# Patient Record
Sex: Male | Born: 1935 | Race: White | Hispanic: No | Smoking: Former smoker
Health system: Southern US, Community
[De-identification: ages and names within clinical notes are randomized; demographics above are authoritative.]

## PROBLEM LIST (undated history)

## (undated) DIAGNOSIS — R001 Bradycardia, unspecified: Secondary | ICD-10-CM

## (undated) DIAGNOSIS — N400 Enlarged prostate without lower urinary tract symptoms: Secondary | ICD-10-CM

## (undated) DIAGNOSIS — Z960 Presence of urogenital implants: Secondary | ICD-10-CM

## (undated) DIAGNOSIS — I209 Angina pectoris, unspecified: Secondary | ICD-10-CM

## (undated) DIAGNOSIS — R2689 Other abnormalities of gait and mobility: Secondary | ICD-10-CM

## (undated) DIAGNOSIS — N189 Chronic kidney disease, unspecified: Secondary | ICD-10-CM

## (undated) DIAGNOSIS — R609 Edema, unspecified: Secondary | ICD-10-CM

## (undated) DIAGNOSIS — K219 Gastro-esophageal reflux disease without esophagitis: Secondary | ICD-10-CM

## (undated) DIAGNOSIS — M1990 Unspecified osteoarthritis, unspecified site: Secondary | ICD-10-CM

## (undated) DIAGNOSIS — I749 Embolism and thrombosis of unspecified artery: Secondary | ICD-10-CM

## (undated) DIAGNOSIS — Z77090 Contact with and (suspected) exposure to asbestos: Secondary | ICD-10-CM

## (undated) DIAGNOSIS — I219 Acute myocardial infarction, unspecified: Secondary | ICD-10-CM

## (undated) DIAGNOSIS — Z978 Presence of other specified devices: Secondary | ICD-10-CM

## (undated) DIAGNOSIS — H9193 Unspecified hearing loss, bilateral: Secondary | ICD-10-CM

## (undated) HISTORY — PX: CATARACT EXTRACTION, BILATERAL: SHX1313

## (undated) HISTORY — PX: BLEPHAROPLASTY: SUR158

## (undated) HISTORY — PX: CHOLECYSTECTOMY: SHX55

## (undated) HISTORY — PX: OTHER SURGICAL HISTORY: SHX169

---

## 2000-04-03 ENCOUNTER — Encounter: Payer: Self-pay | Admitting: *Deleted

## 2000-04-03 ENCOUNTER — Encounter: Admission: RE | Admit: 2000-04-03 | Discharge: 2000-04-03 | Payer: Self-pay | Admitting: *Deleted

## 2001-11-24 ENCOUNTER — Encounter: Payer: Self-pay | Admitting: Family Medicine

## 2001-11-24 ENCOUNTER — Encounter: Admission: RE | Admit: 2001-11-24 | Discharge: 2001-11-24 | Payer: Self-pay | Admitting: Family Medicine

## 2003-05-12 ENCOUNTER — Encounter: Payer: Self-pay | Admitting: *Deleted

## 2003-05-12 ENCOUNTER — Encounter: Admission: RE | Admit: 2003-05-12 | Discharge: 2003-05-12 | Payer: Self-pay | Admitting: *Deleted

## 2003-05-18 ENCOUNTER — Encounter: Admission: RE | Admit: 2003-05-18 | Discharge: 2003-05-18 | Payer: Self-pay | Admitting: Family Medicine

## 2003-05-18 ENCOUNTER — Encounter: Payer: Self-pay | Admitting: Family Medicine

## 2003-08-19 ENCOUNTER — Encounter: Payer: Self-pay | Admitting: Family Medicine

## 2003-08-19 ENCOUNTER — Encounter: Admission: RE | Admit: 2003-08-19 | Discharge: 2003-08-19 | Payer: Self-pay | Admitting: Family Medicine

## 2004-11-14 ENCOUNTER — Encounter: Admission: RE | Admit: 2004-11-14 | Discharge: 2004-11-14 | Payer: Self-pay | Admitting: *Deleted

## 2004-12-06 ENCOUNTER — Ambulatory Visit: Payer: Self-pay | Admitting: Gastroenterology

## 2004-12-10 ENCOUNTER — Ambulatory Visit: Payer: Self-pay | Admitting: Gastroenterology

## 2005-01-10 ENCOUNTER — Ambulatory Visit: Payer: Self-pay | Admitting: Gastroenterology

## 2005-03-14 ENCOUNTER — Ambulatory Visit (HOSPITAL_COMMUNITY): Admission: RE | Admit: 2005-03-14 | Discharge: 2005-03-14 | Payer: Self-pay | Admitting: Family Medicine

## 2006-11-24 ENCOUNTER — Encounter: Admission: RE | Admit: 2006-11-24 | Discharge: 2006-11-24 | Payer: Self-pay | Admitting: Family Medicine

## 2007-01-05 ENCOUNTER — Encounter: Admission: RE | Admit: 2007-01-05 | Discharge: 2007-01-05 | Payer: Self-pay | Admitting: Family Medicine

## 2007-11-18 ENCOUNTER — Ambulatory Visit (HOSPITAL_COMMUNITY): Admission: RE | Admit: 2007-11-18 | Discharge: 2007-11-18 | Payer: Self-pay | Admitting: Family Medicine

## 2007-11-18 ENCOUNTER — Ambulatory Visit: Payer: Self-pay | Admitting: Vascular Surgery

## 2007-11-18 ENCOUNTER — Encounter (INDEPENDENT_AMBULATORY_CARE_PROVIDER_SITE_OTHER): Payer: Self-pay | Admitting: Family Medicine

## 2008-02-09 ENCOUNTER — Encounter: Admission: RE | Admit: 2008-02-09 | Discharge: 2008-02-09 | Payer: Self-pay | Admitting: Family Medicine

## 2008-02-25 ENCOUNTER — Encounter: Admission: RE | Admit: 2008-02-25 | Discharge: 2008-02-25 | Payer: Self-pay | Admitting: Family Medicine

## 2008-06-17 ENCOUNTER — Encounter: Admission: RE | Admit: 2008-06-17 | Discharge: 2008-06-17 | Payer: Self-pay | Admitting: Neurology

## 2010-11-17 DIAGNOSIS — I219 Acute myocardial infarction, unspecified: Secondary | ICD-10-CM

## 2010-11-17 HISTORY — DX: Acute myocardial infarction, unspecified: I21.9

## 2010-11-18 ENCOUNTER — Encounter: Payer: Self-pay | Admitting: Family Medicine

## 2011-11-18 ENCOUNTER — Other Ambulatory Visit: Payer: Self-pay

## 2011-11-18 ENCOUNTER — Emergency Department (HOSPITAL_COMMUNITY): Payer: Medicare Other

## 2011-11-18 ENCOUNTER — Inpatient Hospital Stay (HOSPITAL_COMMUNITY)
Admission: EM | Admit: 2011-11-18 | Discharge: 2011-12-03 | DRG: 234 | Disposition: A | Discharge status: Skilled Nursing Facility | Payer: Medicare Other | Source: Ambulatory Visit | Attending: Surgery | Admitting: Surgery

## 2011-11-18 ENCOUNTER — Encounter (HOSPITAL_COMMUNITY): Payer: Self-pay

## 2011-11-18 DIAGNOSIS — R05 Cough: Secondary | ICD-10-CM | POA: Diagnosis not present

## 2011-11-18 DIAGNOSIS — Z87891 Personal history of nicotine dependence: Secondary | ICD-10-CM | POA: Diagnosis not present

## 2011-11-18 DIAGNOSIS — E871 Hypo-osmolality and hyponatremia: Secondary | ICD-10-CM | POA: Diagnosis not present

## 2011-11-18 DIAGNOSIS — I214 Non-ST elevation (NSTEMI) myocardial infarction: Secondary | ICD-10-CM | POA: Diagnosis not present

## 2011-11-18 DIAGNOSIS — R609 Edema, unspecified: Secondary | ICD-10-CM | POA: Diagnosis present

## 2011-11-18 DIAGNOSIS — K219 Gastro-esophageal reflux disease without esophagitis: Secondary | ICD-10-CM | POA: Diagnosis present

## 2011-11-18 DIAGNOSIS — J8410 Pulmonary fibrosis, unspecified: Secondary | ICD-10-CM | POA: Diagnosis not present

## 2011-11-18 DIAGNOSIS — I2510 Atherosclerotic heart disease of native coronary artery without angina pectoris: Secondary | ICD-10-CM

## 2011-11-18 DIAGNOSIS — Z77090 Contact with and (suspected) exposure to asbestos: Secondary | ICD-10-CM | POA: Diagnosis not present

## 2011-11-18 DIAGNOSIS — R0602 Shortness of breath: Secondary | ICD-10-CM | POA: Diagnosis not present

## 2011-11-18 DIAGNOSIS — I509 Heart failure, unspecified: Secondary | ICD-10-CM | POA: Diagnosis not present

## 2011-11-18 DIAGNOSIS — R339 Retention of urine, unspecified: Secondary | ICD-10-CM | POA: Diagnosis not present

## 2011-11-18 DIAGNOSIS — IMO0002 Reserved for concepts with insufficient information to code with codable children: Secondary | ICD-10-CM | POA: Diagnosis not present

## 2011-11-18 DIAGNOSIS — Z5189 Encounter for other specified aftercare: Secondary | ICD-10-CM | POA: Diagnosis not present

## 2011-11-18 DIAGNOSIS — N9989 Other postprocedural complications and disorders of genitourinary system: Secondary | ICD-10-CM | POA: Diagnosis not present

## 2011-11-18 DIAGNOSIS — I252 Old myocardial infarction: Secondary | ICD-10-CM | POA: Diagnosis not present

## 2011-11-18 DIAGNOSIS — D62 Acute posthemorrhagic anemia: Secondary | ICD-10-CM | POA: Diagnosis not present

## 2011-11-18 DIAGNOSIS — D72829 Elevated white blood cell count, unspecified: Secondary | ICD-10-CM | POA: Diagnosis present

## 2011-11-18 DIAGNOSIS — D696 Thrombocytopenia, unspecified: Secondary | ICD-10-CM | POA: Diagnosis not present

## 2011-11-18 DIAGNOSIS — Z86718 Personal history of other venous thrombosis and embolism: Secondary | ICD-10-CM | POA: Diagnosis not present

## 2011-11-18 DIAGNOSIS — I10 Essential (primary) hypertension: Secondary | ICD-10-CM | POA: Diagnosis present

## 2011-11-18 DIAGNOSIS — R059 Cough, unspecified: Secondary | ICD-10-CM | POA: Diagnosis not present

## 2011-11-18 DIAGNOSIS — I495 Sick sinus syndrome: Secondary | ICD-10-CM | POA: Diagnosis not present

## 2011-11-18 DIAGNOSIS — J90 Pleural effusion, not elsewhere classified: Secondary | ICD-10-CM | POA: Diagnosis not present

## 2011-11-18 DIAGNOSIS — Z7982 Long term (current) use of aspirin: Secondary | ICD-10-CM | POA: Diagnosis not present

## 2011-11-18 DIAGNOSIS — N401 Enlarged prostate with lower urinary tract symptoms: Secondary | ICD-10-CM | POA: Diagnosis not present

## 2011-11-18 DIAGNOSIS — R079 Chest pain, unspecified: Secondary | ICD-10-CM | POA: Diagnosis not present

## 2011-11-18 DIAGNOSIS — J9819 Other pulmonary collapse: Secondary | ICD-10-CM | POA: Diagnosis not present

## 2011-11-18 DIAGNOSIS — M1990 Unspecified osteoarthritis, unspecified site: Secondary | ICD-10-CM | POA: Diagnosis present

## 2011-11-18 DIAGNOSIS — Z01810 Encounter for preprocedural cardiovascular examination: Secondary | ICD-10-CM | POA: Diagnosis not present

## 2011-11-18 DIAGNOSIS — I219 Acute myocardial infarction, unspecified: Secondary | ICD-10-CM | POA: Diagnosis not present

## 2011-11-18 DIAGNOSIS — E119 Type 2 diabetes mellitus without complications: Secondary | ICD-10-CM | POA: Diagnosis present

## 2011-11-18 DIAGNOSIS — R0789 Other chest pain: Secondary | ICD-10-CM | POA: Diagnosis not present

## 2011-11-18 DIAGNOSIS — J438 Other emphysema: Secondary | ICD-10-CM | POA: Diagnosis not present

## 2011-11-18 DIAGNOSIS — D649 Anemia, unspecified: Secondary | ICD-10-CM | POA: Diagnosis not present

## 2011-11-18 DIAGNOSIS — I2109 ST elevation (STEMI) myocardial infarction involving other coronary artery of anterior wall: Secondary | ICD-10-CM | POA: Diagnosis not present

## 2011-11-18 DIAGNOSIS — Z09 Encounter for follow-up examination after completed treatment for conditions other than malignant neoplasm: Secondary | ICD-10-CM | POA: Diagnosis not present

## 2011-11-18 DIAGNOSIS — I498 Other specified cardiac arrhythmias: Secondary | ICD-10-CM | POA: Diagnosis not present

## 2011-11-18 DIAGNOSIS — J811 Chronic pulmonary edema: Secondary | ICD-10-CM | POA: Diagnosis not present

## 2011-11-18 DIAGNOSIS — N400 Enlarged prostate without lower urinary tract symptoms: Secondary | ICD-10-CM | POA: Diagnosis present

## 2011-11-18 DIAGNOSIS — E8779 Other fluid overload: Secondary | ICD-10-CM | POA: Diagnosis not present

## 2011-11-18 DIAGNOSIS — N390 Urinary tract infection, site not specified: Secondary | ICD-10-CM | POA: Diagnosis not present

## 2011-11-18 DIAGNOSIS — Z951 Presence of aortocoronary bypass graft: Secondary | ICD-10-CM | POA: Diagnosis not present

## 2011-11-18 DIAGNOSIS — R091 Pleurisy: Secondary | ICD-10-CM | POA: Diagnosis not present

## 2011-11-18 HISTORY — DX: Embolism and thrombosis of unspecified artery: I74.9

## 2011-11-18 HISTORY — DX: Benign prostatic hyperplasia without lower urinary tract symptoms: N40.0

## 2011-11-18 HISTORY — DX: Contact with and (suspected) exposure to asbestos: Z77.090

## 2011-11-18 HISTORY — DX: Gastro-esophageal reflux disease without esophagitis: K21.9

## 2011-11-18 HISTORY — DX: Acute myocardial infarction, unspecified: I21.9

## 2011-11-18 HISTORY — DX: Bradycardia, unspecified: R00.1

## 2011-11-18 HISTORY — DX: Angina pectoris, unspecified: I20.9

## 2011-11-18 HISTORY — DX: Edema, unspecified: R60.9

## 2011-11-18 HISTORY — DX: Unspecified osteoarthritis, unspecified site: M19.90

## 2011-11-18 LAB — CBC
Hemoglobin: 13.8 g/dL (ref 13.0–17.0)
MCHC: 34.4 g/dL (ref 30.0–36.0)
MCV: 91 fL (ref 78.0–100.0)
Platelets: 208 10*3/uL (ref 150–400)
RBC: 4.36 MIL/uL (ref 4.22–5.81)
RBC: 4.33 MIL/uL (ref 4.22–5.81)
RDW: 13.8 % (ref 11.5–15.5)
WBC: 8.4 10*3/uL (ref 4.0–10.5)
WBC: 11 10*3/uL — ABNORMAL HIGH (ref 4.0–10.5)

## 2011-11-18 LAB — CREATININE, SERUM
GFR calc Af Amer: 83 mL/min — ABNORMAL LOW (ref >90)
GFR calc non Af Amer: 72 mL/min — ABNORMAL LOW (ref >90)

## 2011-11-18 LAB — COMPREHENSIVE METABOLIC PANEL
AST: 18 U/L (ref 0–37)
Albumin: 3.6 g/dL (ref 3.5–5.2)
Alkaline Phosphatase: 65 U/L (ref 39–117)
BUN: 22 mg/dL (ref 6–23)
Chloride: 104 mEq/L (ref 96–112)
Potassium: 3.7 mEq/L (ref 3.5–5.1)
Total Bilirubin: 0.3 mg/dL (ref 0.3–1.2)

## 2011-11-18 LAB — DIFFERENTIAL
Basophils Relative: 0 % (ref 0–1)
Monocytes Relative: 7 % (ref 3–12)
Neutro Abs: 6.2 10*3/uL (ref 1.7–7.7)
Neutrophils Relative %: 74 % (ref 43–77)

## 2011-11-18 LAB — CARDIAC PANEL(CRET KIN+CKTOT+MB+TROPI)
Relative Index: 11.1 — ABNORMAL HIGH (ref 0.0–2.5)
Total CK: 119 U/L (ref 7–232)
Total CK: 507 U/L — ABNORMAL HIGH (ref 7–232)
Troponin I: 6.2 ng/mL (ref <0.30)

## 2011-11-18 LAB — HEMOGLOBIN A1C: Mean Plasma Glucose: 131 mg/dL — ABNORMAL HIGH (ref <117)

## 2011-11-18 LAB — URINALYSIS, ROUTINE W REFLEX MICROSCOPIC
Glucose, UA: 250 mg/dL — AB
Hgb urine dipstick: NEGATIVE
Specific Gravity, Urine: 1.014 (ref 1.005–1.030)
pH: 6 (ref 5.0–8.0)

## 2011-11-18 LAB — HEPARIN LEVEL (UNFRACTIONATED): Heparin Unfractionated: 0.33 IU/mL (ref 0.30–0.70)

## 2011-11-18 LAB — APTT: aPTT: 69 seconds — ABNORMAL HIGH (ref 24–37)

## 2011-11-18 MED ORDER — HEPARIN BOLUS VIA INFUSION
4000.0000 [IU] | Freq: Once | INTRAVENOUS | Status: AC
  Administered 2011-11-18: 4000 [IU] via INTRAVENOUS
  Filled 2011-11-18: qty 4000

## 2011-11-18 MED ORDER — ONDANSETRON HCL 4 MG/2ML IJ SOLN: 4.0000 mg | Freq: Four times a day (QID) | INTRAMUSCULAR | Status: DC | PRN

## 2011-11-18 MED ORDER — SODIUM CHLORIDE 0.9 % IV SOLN
Freq: Once | INTRAVENOUS | Status: AC
  Administered 2011-11-18: 20 mL/h via INTRAVENOUS

## 2011-11-18 MED ORDER — ACETAMINOPHEN 325 MG PO TABS
650.0000 mg | ORAL_TABLET | ORAL | Status: DC | PRN
  Administered 2011-11-20 (×2): 650 mg via ORAL
  Filled 2011-11-18 (×2): qty 2

## 2011-11-18 MED ORDER — ADULT MULTIVITAMIN W/MINERALS CH
1.0000 | ORAL_TABLET | Freq: Every day | ORAL | Status: DC
  Administered 2011-11-19 – 2011-11-25 (×7): 1 via ORAL
  Filled 2011-11-18 (×10): qty 1

## 2011-11-18 MED ORDER — NITROGLYCERIN IN D5W 200-5 MCG/ML-% IV SOLN
5.0000 ug/min | INTRAVENOUS | Status: AC
  Administered 2011-11-18: 5 ug/min via INTRAVENOUS
  Filled 2011-11-18: qty 250

## 2011-11-18 MED ORDER — HEPARIN SOD (PORCINE) IN D5W 100 UNIT/ML IV SOLN
1200.0000 [IU]/h | INTRAVENOUS | Status: DC
  Administered 2011-11-18: 1100 [IU]/h via INTRAVENOUS
  Administered 2011-11-19: 1200 [IU]/h via INTRAVENOUS
  Filled 2011-11-18 (×4): qty 250

## 2011-11-18 MED ORDER — NITROGLYCERIN 2 % TD OINT: 1.0000 [in_us] | TOPICAL_OINTMENT | Freq: Once | TRANSDERMAL | Status: DC

## 2011-11-18 MED ORDER — ASPIRIN EC 81 MG PO TBEC
81.0000 mg | DELAYED_RELEASE_TABLET | Freq: Every day | ORAL | Status: DC
  Administered 2011-11-19 – 2011-11-20 (×2): 81 mg via ORAL
  Filled 2011-11-18 (×3): qty 1

## 2011-11-18 MED ORDER — NITROGLYCERIN 0.4 MG SL SUBL
0.4000 mg | SUBLINGUAL_TABLET | SUBLINGUAL | Status: DC | PRN
  Filled 2011-11-18: qty 25

## 2011-11-18 MED ORDER — ALPRAZOLAM 0.25 MG PO TABS: 0.2500 mg | ORAL_TABLET | Freq: Two times a day (BID) | ORAL | Status: DC | PRN

## 2011-11-18 MED ORDER — METOPROLOL TARTRATE 12.5 MG HALF TABLET
12.5000 mg | ORAL_TABLET | Freq: Two times a day (BID) | ORAL | Status: DC
  Administered 2011-11-18 – 2011-11-24 (×8): 12.5 mg via ORAL
  Filled 2011-11-18 (×14): qty 1

## 2011-11-18 MED ORDER — HEPARIN BOLUS VIA INFUSION: 4000.0000 [IU] | Freq: Once | INTRAVENOUS | Status: DC

## 2011-11-18 MED ORDER — ASPIRIN 81 MG PO CHEW
324.0000 mg | CHEWABLE_TABLET | ORAL | Status: AC
  Administered 2011-11-18: 243 mg via ORAL
  Filled 2011-11-18: qty 4

## 2011-11-18 MED ORDER — TAMSULOSIN HCL 0.4 MG PO CAPS
0.4000 mg | ORAL_CAPSULE | Freq: Every day | ORAL | Status: DC
  Administered 2011-11-19 – 2011-12-03 (×14): 0.4 mg via ORAL
  Filled 2011-11-18 (×17): qty 1

## 2011-11-18 MED ORDER — PANTOPRAZOLE SODIUM 40 MG PO TBEC
40.0000 mg | DELAYED_RELEASE_TABLET | Freq: Every day | ORAL | Status: DC
  Administered 2011-11-18 – 2011-11-27 (×9): 40 mg via ORAL
  Filled 2011-11-18 (×8): qty 1

## 2011-11-18 NOTE — Progress Notes (Signed)
ANTICOAGULATION CONSULT NOTE - Initial Consult  Pharmacy Consult for heparin Indication: NSTEMI  No Known Allergies  Patient Measurements: Heparin Dosing Weight: 84.1kg  Vital Signs: Temp: 97.1 F (36.2 C) (01/21 1158) Temp src: Oral (01/21 1158) BP: 159/67 mmHg (01/21 1216) Pulse Rate: 66  (01/21 1216)  Labs:  Basename 11/18/11 1117  HGB 13.8  HCT 40.1  PLT 201  APTT --  LABPROT --  INR --  HEPARINUNFRC --  CREATININE --  CKTOTAL --  CKMB --  TROPONINI --   CrCl is unknown because no creatinine reading has been taken and the patient has no height on file.  Medical History: Past Medical History  Diagnosis Date  . Edema   . Acid reflux   . Benign prostatic hypertrophy     Medications:  Aspirin, nexium, lasix, MVI, flomax  Assessment: 89 yom admitted with chest pain without previous cardiac history. Starting empiric IV heparin. Stated height/weight of 68in/185lb. Baseline CBC WNL.   Goal of Therapy:  Heparin level 0.3-0.7 units/ml   Plan:  1. Heparin bolus 4000units IV x 1 2. Heparin gtt 1100units/hr 3. Check 8 hour heparin level 4. Daily heparin level and CBC  Waddell Iten, Drake Leach 11/18/2011,12:23 PM

## 2011-11-18 NOTE — ED Notes (Signed)
Cardiac marker results shown to Dr. Ignacia Palma

## 2011-11-18 NOTE — ED Notes (Signed)
[id number] Ready 

## 2011-11-18 NOTE — ED Provider Notes (Signed)
History     CSN: [id number]  Arrival date & time 11/18/11  1050   First MD Initiated Contact with Patient 11/18/11 1105      Chief Complaint  Patient presents with  . Chest Pain    (Consider location/radiation/quality/duration/timing/severity/associated sxs/prior treatment) HPI Comments: The patient is an 78 year old man who says that he'd had a bad cough for 3 weeks. The cold finally got better. He had felt a pain in the upper chest, right Center, for the past few days. He says "it just hurts, it is uncomfortable. He can't rest because of the pain. He has taken aspirin at night for the past couple of nights, trying to enable himself to  rest. He therefore sought evaluation.  There is no prior history of coronary artery disease.  Patient is a 78 y.o. male presenting with chest pain. The history is provided by the patient.  Chest Pain The chest pain began 3 - 5 days ago. Episode Length: He feels the pain fairly constantly, in the upper chest. The chest pain is unchanged. Associated with: There was no precipitating event, other than the fact that he had a recent cold. At its most intense, the pain is at 3/10. The pain is currently at 3/10. The severity of the pain is mild. Quality: " It just hurts." The pain does not radiate. Exacerbated by: Nothing. Pertinent negatives for primary symptoms include no fever, no shortness of breath, no nausea, no vomiting and no dizziness. Cough: he had a cough with his cold, but that has resolved. He tried aspirin for the symptoms. Risk factors include male gender, lack of exercise and being elderly. Past medical history comments: He takes Lasix for peripheral edema. He is on Nexium for GERD, and Flomax for BPH.     Past Medical History  Diagnosis Date  . Edema   . Acid reflux     History reviewed. No pertinent past surgical history.  History reviewed. No pertinent family history.  History  Substance Use Topics  . Smoking status: Never Smoker   .  Smokeless tobacco: Not on file  . Alcohol Use: No      Review of Systems  Constitutional: Negative.  Negative for fever and chills.  HENT: Negative.   Eyes: Negative.   Respiratory: Negative for shortness of breath. Cough: he had a cough with his cold, but that has resolved.        He had a recent upper respiratory infection that lasted 3 weeks.  Cardiovascular: Positive for chest pain.  Gastrointestinal: Negative.  Negative for nausea and vomiting.  Genitourinary: Negative.   Musculoskeletal: Negative.   Skin: Negative.   Neurological: Negative.  Negative for dizziness.  Psychiatric/Behavioral: Negative.     Allergies  Review of patient's allergies indicates no known allergies.  Home Medications   Current Outpatient Rx  Name Route Sig Dispense Refill  . ASPIRIN EC 81 MG PO TBEC Oral Take 81 mg by mouth daily.    Marland Kitchen ESOMEPRAZOLE MAGNESIUM 40 MG PO CPDR Oral Take 40 mg by mouth daily before breakfast.    . FUROSEMIDE 40 MG PO TABS Oral Take 40 mg by mouth daily.    . ADULT MULTIVITAMIN W/MINERALS CH Oral Take 1 tablet by mouth daily.    Marland Kitchen TAMSULOSIN HCL 0.4 MG PO CAPS Oral Take 0.4 mg by mouth daily after breakfast.      BP 159/88  Pulse 77  Temp 97 F (36.1 C)  Resp 18  SpO2 98%  Physical Exam  Nursing note and vitals reviewed. Constitutional: He is oriented to person, place, and time. He appears well-developed and well-nourished. No distress.  HENT:  Head: Normocephalic and atraumatic.  Right Ear: External ear normal.  Left Ear: External ear normal.  Mouth/Throat: Oropharyngeal exudate: he has poor dentition.  Eyes: Conjunctivae and EOM are normal. Pupils are equal, round, and reactive to light.  Neck: Normal range of motion. Neck supple.  Cardiovascular: Normal rate, regular rhythm and normal heart sounds.   Pulmonary/Chest: Effort normal and breath sounds normal.  Abdominal: Soft. Bowel sounds are normal.  Musculoskeletal: Normal range of motion.       Has 2+  pitting edema in both legs. This extended almost up to his knees.  Neurological: He is alert and oriented to person, place, and time.       No sensory or motor deficit.  Skin: Skin is warm and dry.  Psychiatric: He has a normal mood and affect. His behavior is normal.    ED Course  CRITICAL CARE Performed by: Osvaldo Human Authorized by: Osvaldo Human Critical care was necessary to treat or prevent imminent or life-threatening deterioration of the following conditions: Treatment of NSTEMI.  Critical care was time spent personally by me on the following activities: development of treatment plan with patient or surrogate, discussions with consultants, evaluation of patient's response to treatment, examination of patient, obtaining history from patient or surrogate, ordering and performing treatments and interventions, ordering and review of laboratory studies, ordering and review of radiographic studies, re-evaluation of patient's condition and review of old charts.   (including critical care time)   Labs Reviewed  CBC  DIFFERENTIAL  COMPREHENSIVE METABOLIC PANEL  I-STAT TROPONIN I   11:43 AM  Date: 11/18/2011  Rate: 87  Rhythm: normal sinus rhythm and premature ventricular contractions (PVC)  QRS Axis: normal  Intervals: PR prolonged  ST/T Wave abnormalities: ST depressions inferiorly and ST depressions laterally  Conduction Disutrbances:none  Narrative Interpretation: Abnormal EKG  Old EKG Reviewed: none available   11:57 AM Troponin I was 0.17, high.  Will treat as a NSTEMI with heparin (dosing per Pharmacy) and  IV nitroglycerin.  Call to Dr. Eldridge Dace (in cath lab currently) and he will admit.      1. NSTEMI (non-ST elevated myocardial infarction)           Carleene Cooper III, MD 11/18/11 [phone]

## 2011-11-18 NOTE — ED Notes (Signed)
Pt complains of chest pain, started 2-3 days ago, sts feels like coming down with cold and has had a lot of coughing.

## 2011-11-18 NOTE — ED Provider Notes (Signed)
Pt complains of chest pain off and on for a few days.  pe lungs clear heart rrr.  ekg with st depression.  Pt will be sent to pod a  Benny Lennert, MD 11/18/11 1124

## 2011-11-18 NOTE — ED Notes (Signed)
Family at bedside. 

## 2011-11-18 NOTE — ED Notes (Signed)
First contact with pt. Pt c/o cp for 1 week. Pt states no change in pain just has not gotten better. Pt with monitors intact daughter at bedside

## 2011-11-18 NOTE — Progress Notes (Signed)
ANTICOAGULATION CONSULT NOTE - Follow Up Consult  Pharmacy Consult for heparin Indication: NSTEMI  No Known Allergies  Patient Measurements: Height: 5\' 8"  (172.7 cm) Weight: 173 lb 11.6 oz (78.8 kg) IBW/kg (Calculated) : 68.4   Vital Signs: Temp: 97.5 F (36.4 C) (01/21 2048) Temp src: Oral (01/21 2048) BP: 133/73 mmHg (01/21 2048) Pulse Rate: 56  (01/21 2048)  Labs:  Basename 11/18/11 2144 11/18/11 1806 11/18/11 1349 11/18/11 1117  HGB -- 13.5 -- 13.8  HCT -- 39.4 -- 40.1  PLT -- 208 -- 201  APTT -- 69* -- --  LABPROT -- -- 13.3 --  INR -- -- 0.99 --  HEPARINUNFRC 0.33 -- -- --  CREATININE -- 0.95 -- 1.02  CKTOTAL -- 507* 119 --  CKMB -- 56.3* 6.8* --  TROPONINI -- 6.20* 0.43* --   Estimated Creatinine Clearance: 51 ml/min (by C-G formula based on Cr of 0.95).   Medications:  Scheduled:    . sodium chloride   Intravenous Once  . aspirin  324 mg Oral NOW  . aspirin EC  81 mg Oral Daily  . heparin  4,000 Units Intravenous Once  . metoprolol tartrate  12.5 mg Oral BID  . mulitivitamin with minerals  1 tablet Oral Daily  . pantoprazole  40 mg Oral Daily  . Tamsulosin HCl  0.4 mg Oral QPC breakfast  . DISCONTD: heparin  4,000 Units Intravenous Once  . DISCONTD: nitroGLYCERIN  1 inch Topical Once   Infusions:    . heparin 1,100 Units/hr (11/18/11 2200)  . nitroGLYCERIN 10 mcg/min (11/18/11 2200)    Assessment: 78yo male therapeutic on heparin with initial dosing for NSTEMI.  Goal of Therapy:  Heparin level 0.3-0.7 units/ml   Plan:  Will continue heparin at current rate and confirm stable with am labs.  Colleen Can PharmD BCPS 11/18/2011,11:33 PM

## 2011-11-18 NOTE — H&P (Signed)
Admit date: 11/18/2011 Referring Physician Dr. Ignacia Palma Primary Cardiologist Eldridge Dace Chief complaint/reason for admission: Chest pain  HPI: 78 year old man with no prior cardiac history.  He had a cold about a week ago.  He was coughing a lot and developed some chest pain with coughing.  He was in the upper part of his chest.  The cold resolved but the chest pain persisted.  It was somewhat better when he would rest and worse when he would walk.  He came to the emergency room be checked out after calling his primary care physician.  His initial cardiac enzyme was abnormal at 0.17 for the troponin.  His ECG was not diagnostic for an MI.  He continues to have some on-and-off chest discomfort in the upper portion of his chest.  It is not severe.  He is referred for further evaluation.  He does not report any diaphoresis, nausea, vomiting, focal weakness.  He does have chronic edema, particularly in his left leg.  He did have a DVT in that leg several years ago for which he took Coumadin for some time.  He is not on Coumadin.  He would like to avoid any invasive procedures if possible and would like to be medically managed for whatever his current issue is.  He will be turning 89 on Saturday.    PMH:    Past Medical History  Diagnosis Date  . Edema   . Acid reflux   . Benign prostatic hypertrophy     PSH:   Cholecystectomy  ALLERGIES:   Review of patient's allergies indicates no known allergies.  Prior to Admit Meds:   (Not in a hospital admission) Family HX:   Father had heart disease  Social HX:    History   Social History  . Marital Status: Widowed    Spouse Name: N/A    Number of Children: N/A  . Years of Education: N/A   Occupational History  . Not on file.   Social History Main Topics  . Smoking status: Never Smoker   . Smokeless tobacco: Not on file  . Alcohol Use: No  . Drug Use: No  . Sexually Active:    Other Topics Concern  . Not on file   Social History Narrative    . No narrative on file     ROS:  All 11 ROS were addressed and are negative except what is stated in the HPI  PHYSICAL EXAM Filed Vitals:   11/18/11 1216  BP: 159/67  Pulse: 66  Temp:   Resp: 20   General: Well developed, well nourished, in no acute distress Head:    Normal cephalic and atramatic  Lungs:   Clear bilaterally to auscultation and percussion. Heart:   HRRR S1 S2 Abdomen:  abdomen soft and non-tender  Msk:  Mild kyphosis Extremities:  1+ edema in the left leg, trace edema in the right leg. Neuro: Alert and oriented . Psych:  Good affect, responds appropriately   Labs:   Lab Results  Component Value Date   WBC 8.4 11/18/2011   HGB 13.8 11/18/2011   HCT 40.1 11/18/2011   MCV 92.0 11/18/2011   PLT 201 11/18/2011    Lab 11/18/11 1117  NA 142  K 3.7  CL 104  CO2 27  BUN 22  CREATININE 1.02  CALCIUM 9.5  PROT 7.1  BILITOT 0.3  ALKPHOS 65  ALT 17  AST 18  GLUCOSE 191*   No results found for this basename: CKTOTAL, CKMB, CKMBINDEX,  TROPONINI   No results found for this basename: PTT   No results found for this basename: INR, PROTIME     No results found for this basename: CHOL   No results found for this basename: HDL   No results found for this basename: LDLCALC   No results found for this basename: TRIG   No results found for this basename: CHOLHDL   No results found for this basename: LDLDIRECT      Radiology:  @[id number] @  EKG:  Normal sinus rhythm, PVCs, mild ST depression in the lateral leads.  No ST elevation.  ASSESSMENT: 78 year old with mildly abnormal cardiac enzyme and atypical chest pain.    PLAN:   unstable angina.  Will admit to step down unit.  Titrate nitroglycerin to see if this helps.  Agree with heparin drip.  We'll rule out for MI with enzymes.  Am not sure what the significance of the 0.17 troponin is on the point of care markers.  We'll also check echocardiogram today.  His LV function is normal, would try to manage  him medically.  If his LV function is abnormal or he is a focal wall motion abnormality, would have to consider angiography.  He does prefer to avoid any invasive procedures.  Angiography would be somewhat higher risk due to his advanced age.  Could try long-acting nitrates and beta blockade if this truly does turn out to be angina.  Chronic edema.  Likely related to prior DVT and venous insufficiency.  Will hold Lasix for now until his cardiac status is more defined.  Corky Crafts., MD  11/18/2011  1:53 PM

## 2011-11-19 LAB — BASIC METABOLIC PANEL
CO2: 28 mEq/L (ref 19–32)
Calcium: 9.5 mg/dL (ref 8.4–10.5)
Creatinine, Ser: 0.98 mg/dL (ref 0.50–1.35)
GFR calc Af Amer: 82 mL/min — ABNORMAL LOW (ref >90)
GFR calc non Af Amer: 71 mL/min — ABNORMAL LOW (ref >90)
Sodium: 142 mEq/L (ref 135–145)

## 2011-11-19 LAB — CARDIAC PANEL(CRET KIN+CKTOT+MB+TROPI)
Relative Index: 9.6 — ABNORMAL HIGH (ref 0.0–2.5)
Total CK: 646 U/L — ABNORMAL HIGH (ref 7–232)
Troponin I: 13.79 ng/mL (ref <0.30)

## 2011-11-19 LAB — URINALYSIS, ROUTINE W REFLEX MICROSCOPIC
Glucose, UA: NEGATIVE mg/dL
Specific Gravity, Urine: 1.012 (ref 1.005–1.030)
Urobilinogen, UA: 0.2 mg/dL (ref 0.0–1.0)

## 2011-11-19 LAB — CBC
MCH: 30.6 pg (ref 26.0–34.0)
MCHC: 33.6 g/dL (ref 30.0–36.0)
MCV: 91 fL (ref 78.0–100.0)
Platelets: 195 10*3/uL (ref 150–400)
RBC: 4.12 MIL/uL — ABNORMAL LOW (ref 4.22–5.81)
RDW: 13.7 % (ref 11.5–15.5)

## 2011-11-19 LAB — LIPID PANEL
Cholesterol: 136 mg/dL (ref 0–200)
Triglycerides: 68 mg/dL (ref <150)
VLDL: 14 mg/dL (ref 0–40)

## 2011-11-19 LAB — URINE MICROSCOPIC-ADD ON

## 2011-11-19 MED ORDER — ISOSORBIDE MONONITRATE ER 30 MG PO TB24
30.0000 mg | ORAL_TABLET | Freq: Every day | ORAL | Status: DC
  Administered 2011-11-19 – 2011-11-20 (×2): 30 mg via ORAL
  Filled 2011-11-19 (×2): qty 1

## 2011-11-19 NOTE — Progress Notes (Signed)
ANTICOAGULATION CONSULT NOTE - Follow Up Consult  Pharmacy Consult for Heparin Indication: ACS sx  Assessment: 78 y.o. M on warfarin for ACS sx while ruling out MI. Heparin level is slightly SUBtherapeutic this a.m (HL 0.29, goal of 0.3-0.7). No s/sx of bleeding noted at this time. Heparin drip is infusing at proper rate.   Goal of Therapy:  Heparin level 0.3-0.7 units/ml   Plan:  1. Increase heparin drip to 1200 units/hr (12 ml/hr) 2. Will continue to monitor for any signs/symptoms of bleeding and will follow up with heparin level in the a.m.   Georgina Pillion, PharmD, BCPS Clinical Pharmacist Pager: [phone] 11/19/2011 8:09 AM     No Known Allergies  Patient Measurements: Height: 5\' 8"  (172.7 cm) Weight: 173 lb 11.6 oz (78.8 kg) IBW/kg (Calculated) : 68.4  Heparin Dosing Weight: 78.8  Vital Signs: Temp: 98.4 F (36.9 C) (01/22 0410) Temp src: Oral (01/22 0410) BP: 124/64 mmHg (01/22 0410) Pulse Rate: 55  (01/22 0410)  Labs:  Basename 11/19/11 0505 11/18/11 2332 11/18/11 2144 11/18/11 1806 11/18/11 1349 11/18/11 1117  HGB 12.6* -- -- 13.5 -- --  HCT 37.5* -- -- 39.4 -- 40.1  PLT 195 -- -- 208 -- 201  APTT -- -- -- 69* -- --  LABPROT -- -- -- -- 13.3 --  INR -- -- -- -- 0.99 --  HEPARINUNFRC 0.29* -- 0.33 -- -- --  CREATININE 0.98 -- -- 0.95 -- 1.02  CKTOTAL -- 646* -- 507* 119 --  CKMB -- 62.1* -- 56.3* 6.8* --  TROPONINI -- 13.79* -- 6.20* 0.43* --   Estimated Creatinine Clearance: 49.4 ml/min (by C-G formula based on Cr of 0.98).   Medications:  Prescriptions prior to admission  Medication Sig Dispense Refill  . aspirin EC 81 MG tablet Take 81 mg by mouth daily.      Marland Kitchen esomeprazole (NEXIUM) 40 MG capsule Take 40 mg by mouth daily before breakfast.      . furosemide (LASIX) 40 MG tablet Take 40 mg by mouth daily.      . Multiple Vitamin (MULITIVITAMIN WITH MINERALS) TABS Take 1 tablet by mouth daily.      . Tamsulosin HCl (FLOMAX) 0.4 MG CAPS Take 0.4 mg  by mouth daily after breakfast.       Scheduled:    . sodium chloride   Intravenous Once  . aspirin  324 mg Oral NOW  . aspirin EC  81 mg Oral Daily  . heparin  4,000 Units Intravenous Once  . metoprolol tartrate  12.5 mg Oral BID  . mulitivitamin with minerals  1 tablet Oral Daily  . pantoprazole  40 mg Oral Daily  . Tamsulosin HCl  0.4 mg Oral QPC breakfast  . DISCONTD: heparin  4,000 Units Intravenous Once  . DISCONTD: nitroGLYCERIN  1 inch Topical Once

## 2011-11-19 NOTE — Progress Notes (Signed)
SUBJECTIVE:  Chest pain resolved last night.  No further chest pain this morning.  He slept well.  His appetite is normal.  OBJECTIVE:   Vitals:   Filed Vitals:   11/18/11 2048 11/18/11 2357 11/19/11 0410 11/19/11 0500  BP: 133/73 135/73 124/64   Pulse: 56 65 55   Temp: 97.5 F (36.4 C) 98.3 F (36.8 C) 98.4 F (36.9 C)   TempSrc: Oral Oral Oral   Resp: 17 14 14    Height:      Weight:   78.8 kg (173 lb 11.6 oz) 78.8 kg (173 lb 11.6 oz)  SpO2: 95% 98% 98%    I&O's:   Intake/Output Summary (Last 24 hours) at 11/19/11 0914 Last data filed at 11/19/11 0500  Gross per 24 hour  Intake      0 ml  Output    275 ml  Net   -275 ml   TELEMETRY: Reviewed telemetry pt in normal sinus rhythm.     PHYSICAL EXAM General: Well developed, well nourished, in no acute distress Head:    Normal cephalic and atramatic  Lungs:   Clear bilaterally to auscultation  Heart:   HRRR S1 S2  Abdomen: , abdomen soft and non-tender Msk:  Back normal,  Normal strength and tone for age. Extremities:   Trace edema.   Neuro: Alert and oriented X 3. Psych:  Good affect, responds appropriately   LABS: Basic Metabolic Panel:  Basename 11/19/11 0505 11/18/11 1806 11/18/11 1117  NA 142 -- 142  K 4.2 -- 3.7  CL 105 -- 104  CO2 28 -- 27  GLUCOSE 112* -- 191*  BUN 17 -- 22  CREATININE 0.98 0.95 --  CALCIUM 9.5 -- 9.5  MG -- -- --  PHOS -- -- --   Liver Function Tests:  Basename 11/18/11 1117  AST 18  ALT 17  ALKPHOS 65  BILITOT 0.3  PROT 7.1  ALBUMIN 3.6   No results found for this basename: LIPASE:2,AMYLASE:2 in the last 72 hours CBC:  Basename 11/19/11 0505 11/18/11 1806 11/18/11 1117  WBC 10.2 11.0* --  NEUTROABS -- -- 6.2  HGB 12.6* 13.5 --  HCT 37.5* 39.4 --  MCV 91.0 91.0 --  PLT 195 208 --   Cardiac Enzymes:  Basename 11/18/11 2332 11/18/11 1806 11/18/11 1349  CKTOTAL 646* 507* 119  CKMB 62.1* 56.3* 6.8*  CKMBINDEX -- -- --  TROPONINI 13.79* 6.20* 0.43*   BNP: No  components found with this basename: POCBNP:3 D-Dimer: No results found for this basename: DDIMER:2 in the last 72 hours Hemoglobin A1C:  Basename 11/18/11 1806  HGBA1C 6.2*   Fasting Lipid Panel:  Basename 11/19/11 0505  CHOL 136  HDL 53  LDLCALC 69  TRIG 68  CHOLHDL 2.6  LDLDIRECT --   Thyroid Function Tests: No results found for this basename: TSH,T4TOTAL,FREET3,T3FREE,THYROIDAB in the last 72 hours Anemia Panel: No results found for this basename: VITAMINB12,FOLATE,FERRITIN,TIBC,IRON,RETICCTPCT in the last 72 hours Coag Panel:   Lab Results  Component Value Date   INR 0.99 11/18/2011    RADIOLOGY: Dg Chest Portable 1 View  11/18/2011  *RADIOLOGY REPORT*  Clinical Data: Chest pain, cough, shortness of breath  PORTABLE CHEST - 1 VIEW  Comparison: 04/02/2011  Findings: Cardiomediastinal silhouette is stable.  Bilateral pleural calcifications are again noted.  Worsening pleural calcifications in the left upper hemithorax.  Stable bilateral basilar atelectasis or scarring.  No convincing pulmonary edema or focal infiltrate.  IMPRESSION: Worsening pleural calcifications in the left upper  hemithorax. Stable bilateral basilar atelectasis or scarring.  No convincing pulmonary edema or focal infiltrate.  Original Report Authenticated By: Natasha Mead, M.D.      ASSESSMENT: Non-ST elevation MI  PLAN:  I long discussion with the patient's family.  Given that he is asymptomatic now and his advanced age, we'll plan for continued medical therapy.  If his ECG changes or if he has more symptoms, could consider angiography.  We'll leave him on IV heparin for another day as well as IV nitroglycerin.  We'll try to convert him over to him to work.  Tomorrow, we'll try to get him out of bed walking more to see if this induces any angina.  If not, we'll continue medical therapy.  Continue beta blocker at current dose as his heart rate is in the 50s at rest.  We also discussed CODE STATUS.  For now he  is a full code.  He will talk things over more with his daughter.  He is quite independent.  He drives and cooks for himself.  He lives alone.  He does not walk much for exercise but overall, is quite healthy.  If he did have refractory angina, a cardiac cath would not be unreasonable.  Echocardiogram showed overall normal LV function which may make it easier to manage him medically.  Corky Crafts., MD  11/19/2011  9:14 AM

## 2011-11-19 NOTE — Progress Notes (Signed)
Patient voided in urinal then used some toilet paper to wipe himself dry.Patient noticed some blood on toilet paper.No visible blood seen in urine.Patient remains on heparin drip at 1200 units/hour.Patient and family concerned will place call to MD. me

## 2011-11-20 ENCOUNTER — Encounter (HOSPITAL_COMMUNITY)
Admission: EM | Disposition: A | Discharge status: Skilled Nursing Facility | Payer: Self-pay | Source: Ambulatory Visit | Attending: Interventional Cardiology

## 2011-11-20 ENCOUNTER — Other Ambulatory Visit: Payer: Self-pay

## 2011-11-20 HISTORY — PX: LEFT HEART CATHETERIZATION WITH CORONARY ANGIOGRAM: SHX5451

## 2011-11-20 LAB — BASIC METABOLIC PANEL
CO2: 25 mEq/L (ref 19–32)
Glucose, Bld: 100 mg/dL — ABNORMAL HIGH (ref 70–99)
Potassium: 3.9 mEq/L (ref 3.5–5.1)
Sodium: 141 mEq/L (ref 135–145)

## 2011-11-20 LAB — CBC
HCT: 34.4 % — ABNORMAL LOW (ref 39.0–52.0)
Hemoglobin: 11.6 g/dL — ABNORMAL LOW (ref 13.0–17.0)
MCH: 30.9 pg (ref 26.0–34.0)
MCHC: 33.7 g/dL (ref 30.0–36.0)

## 2011-11-20 SURGERY — LEFT HEART CATHETERIZATION WITH CORONARY ANGIOGRAM
Anesthesia: Moderate Sedation

## 2011-11-20 SURGERY — LEFT HEART CATHETERIZATION WITH CORONARY ANGIOGRAM
Anesthesia: LOCAL

## 2011-11-20 MED ORDER — NITROGLYCERIN IN D5W 200-5 MCG/ML-% IV SOLN
2.0000 ug/min | INTRAVENOUS | Status: DC
  Administered 2011-11-20: 5 ug/min via INTRAVENOUS

## 2011-11-20 MED ORDER — HEPARIN (PORCINE) IN NACL 2-0.9 UNIT/ML-% IJ SOLN
INTRAMUSCULAR | Status: AC
  Filled 2011-11-20: qty 1000

## 2011-11-20 MED ORDER — FUROSEMIDE 40 MG PO TABS
40.0000 mg | ORAL_TABLET | Freq: Every day | ORAL | Status: DC
  Administered 2011-11-21 – 2011-11-25 (×5): 40 mg via ORAL
  Filled 2011-11-20 (×8): qty 1

## 2011-11-20 MED ORDER — SODIUM CHLORIDE 0.9 % IV SOLN: 1.0000 mL/kg/h | INTRAVENOUS | Status: AC

## 2011-11-20 MED ORDER — SODIUM CHLORIDE 0.9 % IJ SOLN: 3.0000 mL | Freq: Two times a day (BID) | INTRAMUSCULAR | Status: DC

## 2011-11-20 MED ORDER — LIDOCAINE HCL (PF) 1 % IJ SOLN
INTRAMUSCULAR | Status: AC
  Filled 2011-11-20: qty 30

## 2011-11-20 MED ORDER — ONDANSETRON HCL 4 MG/2ML IJ SOLN: 4.0000 mg | Freq: Four times a day (QID) | INTRAMUSCULAR | Status: DC | PRN

## 2011-11-20 MED ORDER — ASPIRIN EC 81 MG PO TBEC
81.0000 mg | DELAYED_RELEASE_TABLET | Freq: Every day | ORAL | Status: DC
  Administered 2011-11-22 – 2011-11-25 (×4): 81 mg via ORAL
  Filled 2011-11-20 (×5): qty 1

## 2011-11-20 MED ORDER — VERAPAMIL HCL 2.5 MG/ML IV SOLN
INTRAVENOUS | Status: AC
  Filled 2011-11-20: qty 2

## 2011-11-20 MED ORDER — SODIUM CHLORIDE 0.9 % IJ SOLN: 3.0000 mL | INTRAMUSCULAR | Status: DC | PRN

## 2011-11-20 MED ORDER — HEPARIN (PORCINE) IN NACL 2-0.9 UNIT/ML-% IJ SOLN
INTRAMUSCULAR | Status: AC
  Filled 2011-11-20: qty 2000

## 2011-11-20 MED ORDER — NITROGLYCERIN 0.2 MG/ML ON CALL CATH LAB
INTRAVENOUS | Status: AC
  Filled 2011-11-20: qty 1

## 2011-11-20 MED ORDER — NITROGLYCERIN IN D5W 200-5 MCG/ML-% IV SOLN
INTRAVENOUS | Status: AC
  Filled 2011-11-20: qty 250

## 2011-11-20 MED ORDER — DIAZEPAM 2 MG PO TABS: 2.0000 mg | ORAL_TABLET | ORAL | Status: DC

## 2011-11-20 MED ORDER — SODIUM CHLORIDE 0.9 % IV SOLN: INTRAVENOUS | Status: DC

## 2011-11-20 MED ORDER — HEPARIN SODIUM (PORCINE) 1000 UNIT/ML IJ SOLN
INTRAMUSCULAR | Status: AC
  Filled 2011-11-20: qty 1

## 2011-11-20 MED ORDER — ISOSORBIDE MONONITRATE ER 60 MG PO TB24
60.0000 mg | ORAL_TABLET | Freq: Every day | ORAL | Status: DC
  Administered 2011-11-21 – 2011-11-25 (×5): 60 mg via ORAL
  Filled 2011-11-20 (×6): qty 1

## 2011-11-20 MED ORDER — SODIUM CHLORIDE 0.9 % IV SOLN: 250.0000 mL | INTRAVENOUS | Status: DC | PRN

## 2011-11-20 MED ORDER — ASPIRIN 81 MG PO CHEW: 324.0000 mg | CHEWABLE_TABLET | ORAL | Status: DC

## 2011-11-20 MED ORDER — ACETAMINOPHEN 325 MG PO TABS: 650.0000 mg | ORAL_TABLET | ORAL | Status: DC | PRN

## 2011-11-20 NOTE — Op Note (Signed)
PROCEDURE:  Left heart catheterization with selective coronary angiography, left ventriculogram.  INDICATIONS:    The risks, benefits, and details of the procedure were explained to the patient.  The patient verbalized understanding and wanted to proceed.  Informed written consent was obtained.  PROCEDURE TECHNIQUE:  After Xylocaine anesthesia a 27F sheath was placed in the right radial artery with a single anterior needle wall stick.   Right coronary angiography was done using a No torque right guide catheter.    Left coronary angiography was done using a Judkins L4 guide catheter.  Left ventriculography was done using a pigtail catheter.    CONTRAST:  Total of 90 cc.  COMPLICATIONS:  None.    HEMODYNAMICS:  Aortic pressure was 166/76; LV pressure was 166/13; LVEDP 23.  There was no gradient between the left ventricle and aorta.    ANGIOGRAPHIC DATA:   The left main coronary artery has a 50-60% distal stenosis which extends into both the ostium of the LAD and circumflex .  The left anterior descending artery has an ostial 70% stenosis extending back into the distal left main.  There is a large large septal perforator which comes off of the proximal LAD.  There is a large first diagonal.  Just before the first diagonal there is the beginning of an 80% stenosis.  after the origin of the large diagonal , the LAD is occluded.  The apical LAD fills by left to left collaterals.  The left circumflex artery is a large vessel.  The ostial circumflex has a 70% stenosis.  In the mid circumflex, a large OM1 originates.  Just before the bifurcation of the circumflex and OM1, there is a 80% stenosis which extends somewhat into both branches.  After the stenosis, there is a medium-sized OM 2 which appears patent.  The right coronary artery is a large dominant vessel.  There is mild to moderate diffuse atherosclerosis throughout the RCA system.  The PDA and posterior lateral artery or medium-sized and widely  patent.Marland Kitchen  LEFT VENTRICULOGRAM:  Left ventricular angiogram was done in the 30 RAO projection and revealed overall normal left ventricular  systolic function with an estimated ejection fraction of 50 %.  LVEDP was 18 mmHg.  there was apical hypokinesis.  IMPRESSIONS:  1. 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  The mid LAD is occluded.  The distal LAD fills by left to left collaterals.  There is a 80% mid circumflex stenosis involving the bifurcation of the circumflex with OM1. 2. Overall Normal left ventricular systolic function.  LVEDP 18 mmHg.  Ejection fraction 50 %.  There is apical hypokinesis consistent with infarct from the occluded mid LAD.   RECOMMENDATION:  The patient was pain-free on the table.  We did not pursue any type of intervention at this time.  Any type of intervention would be very high risk given that his distal left main is involved and he has disease in both the LAD and circumflex.  We'll discuss options with the family.  Based on our previous discussions, I think they're leaning more towards medical therapy.  We'll move him to the CCU and continue nitroglycerin drip.  A more inclined to start Plavix now if he decides not to pursue revascularization.  If they are open to talk with the surgeons, we will have him consult.  He is in pretty good health for his age.  Continue medical therapy including statin, beta blocker,  nitrates, aspirin.

## 2011-11-20 NOTE — Progress Notes (Addendum)
SUBJECTIVE:  No  Chest pain last night.  Heparin stopped due to some hematuria.  After I spoke to him this AM, he developed some mild chest pain similar to the sx that brought him to the hospital.  OBJECTIVE:   Vitals:   Filed Vitals:   11/20/11 0005 11/20/11 0351 11/20/11 0500 11/20/11 0800  BP: 108/46 119/52  124/50  Pulse: 52 52  57  Temp: 97.4 F (36.3 C) 97.2 F (36.2 C)  97.5 F (36.4 C)  TempSrc: Oral Oral  Oral  Resp: 16 16  15   Height:      Weight:   82.9 kg (182 lb 12.2 oz)   SpO2: 95% 95%  97%   I&O's:    Intake/Output Summary (Last 24 hours) at 11/20/11 1004 Last data filed at 11/20/11 0530  Gross per 24 hour  Intake    516 ml  Output    800 ml  Net   -284 ml   TELEMETRY: Reviewed telemetry pt in normal sinus rhythm, bradycardia during sleep     PHYSICAL EXAM General: Well developed, well nourished, in no acute distress Head:    Normal cephalic and atramatic  Lungs:   Clear bilaterally to auscultation  Heart:   HRRR S1 S2  Abdomen: , abdomen soft and non-tender Msk:  Back normal,  Normal strength and tone for age. Extremities:   Trace edema.   Neuro: Alert and oriented X 3. Psych:  Good affect, responds appropriately   LABS: Basic Metabolic Panel:  Basename 11/20/11 0610 11/19/11 0505  NA 141 142  K 3.9 4.2  CL 108 105  CO2 25 28  GLUCOSE 100* 112*  BUN 16 17  CREATININE 1.00 0.98  CALCIUM 9.0 9.5  MG -- --  PHOS -- --   Liver Function Tests:  [facility] 11/18/11 1117  AST 18  ALT 17  ALKPHOS 65  BILITOT 0.3  PROT 7.1  ALBUMIN 3.6   No results found for this basename: LIPASE:2,AMYLASE:2 in the last 72 hours CBC:  Basename 11/20/11 0610 11/19/11 0505 11/18/11 1117  WBC 8.6 10.2 --  NEUTROABS -- -- 6.2  HGB 11.6* 12.6* --  HCT 34.4* 37.5* --  MCV 91.5 91.0 --  PLT 159 195 --   Cardiac Enzymes:  Basename 11/19/11 1845 11/18/11 2332 11/18/11 1806  CKTOTAL 275* 646* 507*  CKMB 15.0* 62.1* 56.3*  CKMBINDEX -- -- --  TROPONINI  4.47* 13.79* 6.20*   BNP: No components found with this basename: POCBNP:3 D-Dimer: No results found for this basename: DDIMER:2 in the last 72 hours Hemoglobin A1C:  Basename 11/18/11 1806  HGBA1C 6.2*   Fasting Lipid Panel:  Basename 11/19/11 0505  CHOL 136  HDL 53  LDLCALC 69  TRIG 68  CHOLHDL 2.6  LDLDIRECT --   Thyroid Function Tests: No results found for this basename: TSH,T4TOTAL,FREET3,T3FREE,THYROIDAB in the last 72 hours Anemia Panel: No results found for this basename: VITAMINB12,FOLATE,FERRITIN,TIBC,IRON,RETICCTPCT in the last 72 hours Coag Panel:   Lab Results  Component Value Date   INR 0.99 11/18/2011    RADIOLOGY: Dg Chest Portable 1 View  11/18/2011  *RADIOLOGY REPORT*  Clinical Data: Chest pain, cough, shortness of breath  PORTABLE CHEST - 1 VIEW  Comparison: 04/02/2011  Findings: Cardiomediastinal silhouette is stable.  Bilateral pleural calcifications are again noted.  Worsening pleural calcifications in the left upper hemithorax.  Stable bilateral basilar atelectasis or scarring.  No convincing pulmonary edema or focal infiltrate.  IMPRESSION: Worsening pleural calcifications in the left  upper hemithorax. Stable bilateral basilar atelectasis or scarring.  No convincing pulmonary edema or focal infiltrate.  Original Report Authenticated By: Natasha Mead, M.D.      ASSESSMENT: Non-ST elevation MI  PLAN: Initially, I thought we may be able to discharge him.  Now that his pain is recurring, we are awaiting another ECG.  If this is diagnostic or if his pain persists, we will plan for cath.  THis was discussed with the family.  Of note, his Hbg has steadily dropped.  If we do a PCI, would plan bare metal stent to minimize need for Plavix.    Imdur increased.  Lasix restarted.  Hemoccult stool.    Further plan based on ECG which should be happening soon.  I was at the bedside for 20 minutes while waiting for the stat ECG.  He received SL NTG and due to persistent  pain, IV NTG was restarted.  Family aware that cath will be indicated.  Risks of procedure explained in depth, especially given his advanced age.  We also discussed using Plavix.  Given his hematuria, will hold off for now. If he has multivessel disease, CABG would be difficult.,  Would have to strongly consider intensified medical therapy.  Critical care time 35 minutes.  Corky Crafts., MD  11/20/2011  10:04 AM

## 2011-11-20 NOTE — Progress Notes (Signed)
Dr. Mayford Knife notified of above note new order received to send UA and discontinue IV heparin drip. me

## 2011-11-20 NOTE — Progress Notes (Signed)
IV heparin drip discontinued as ordered.Nitrogylcerin drip discontinued po imdur was started at 2000. me

## 2011-11-21 ENCOUNTER — Other Ambulatory Visit: Payer: Self-pay

## 2011-11-21 DIAGNOSIS — I2510 Atherosclerotic heart disease of native coronary artery without angina pectoris: Secondary | ICD-10-CM

## 2011-11-21 LAB — BASIC METABOLIC PANEL
CO2: 26 mEq/L (ref 19–32)
Chloride: 109 mEq/L (ref 96–112)
GFR calc Af Amer: 86 mL/min — ABNORMAL LOW (ref >90)
Potassium: 4.3 mEq/L (ref 3.5–5.1)
Sodium: 142 mEq/L (ref 135–145)

## 2011-11-21 LAB — CBC
HCT: 36.1 % — ABNORMAL LOW (ref 39.0–52.0)
Hemoglobin: 12.7 g/dL — ABNORMAL LOW (ref 13.0–17.0)
MCH: 32.4 pg (ref 26.0–34.0)
MCV: 92.1 fL (ref 78.0–100.0)
Platelets: 176 10*3/uL (ref 150–400)
RBC: 3.92 MIL/uL — ABNORMAL LOW (ref 4.22–5.81)

## 2011-11-21 LAB — CARDIAC PANEL(CRET KIN+CKTOT+MB+TROPI)
Relative Index: 9.9 — ABNORMAL HIGH (ref 0.0–2.5)
Troponin I: 5.38 ng/mL (ref <0.30)

## 2011-11-21 MED ORDER — AMLODIPINE BESYLATE 2.5 MG PO TABS
2.5000 mg | ORAL_TABLET | Freq: Every day | ORAL | Status: DC
  Administered 2011-11-21 – 2011-11-25 (×5): 2.5 mg via ORAL
  Filled 2011-11-21 (×6): qty 1

## 2011-11-21 NOTE — Progress Notes (Addendum)
SUBJECTIVE:  No  Chest pain last night.  Heparin stopped due to some hematuria. Tolerated cath yesterday.  OBJECTIVE:   Vitals:   Filed Vitals:   11/21/11 0355 11/21/11 0403 11/21/11 0500 11/21/11 0711  BP: 135/67   146/89  Pulse: 45   57  Temp:  97.2 F (36.2 C)  97.3 F (36.3 C)  TempSrc:  Oral  Oral  Resp: 14   14  Height:      Weight:   80.7 kg (177 lb 14.6 oz)   SpO2: 98%   97%   I&O's:    Intake/Output Summary (Last 24 hours) at 11/21/11 0809 Last data filed at 11/21/11 0700  Gross per 24 hour  Intake 2040.2 ml  Output    325 ml  Net 1715.2 ml   TELEMETRY: Reviewed telemetry pt in normal sinus rhythm, bradycardia during sleep     PHYSICAL EXAM General: Well developed, well nourished, in no acute distress Head:    Normal cephalic and atramatic  Lungs:   Clear bilaterally to auscultation  Heart:   HRRR S1 S2  Abdomen: , abdomen soft and non-tender Msk:  Back normal,  Normal strength and tone for age. Extremities:   Trace edema.  3+ right radial pulse Neuro: Alert and oriented X 3. Psych:  Good affect, responds appropriately   LABS: Basic Metabolic Panel:  Basename 11/21/11 0520 11/20/11 0610  NA 142 141  K 4.3 3.9  CL 109 108  CO2 26 25  GLUCOSE 105* 100*  BUN 15 16  CREATININE 0.87 1.00  CALCIUM 9.1 9.0  MG -- --  PHOS -- --   Liver Function Tests:  [facility] 11/18/11 1117  AST 18  ALT 17  ALKPHOS 65  BILITOT 0.3  PROT 7.1  ALBUMIN 3.6   No results found for this basename: LIPASE:2,AMYLASE:2 in the last 72 hours CBC:  Basename 11/21/11 0520 11/20/11 0610 11/18/11 1117  WBC 9.7 8.6 --  NEUTROABS -- -- 6.2  HGB 12.7* 11.6* --  HCT 36.1* 34.4* --  MCV 92.1 91.5 --  PLT 176 159 --   Cardiac Enzymes:  Basename 11/21/11 0520 11/19/11 1845 11/18/11 2332  CKTOTAL 300* 275* 646*  CKMB 29.8* 15.0* 62.1*  CKMBINDEX -- -- --  TROPONINI 5.38* 4.47* 13.79*   BNP: No components found with this basename: POCBNP:3 D-Dimer: No results found  for this basename: DDIMER:2 in the last 72 hours Hemoglobin A1C:  Basename 11/18/11 1806  HGBA1C 6.2*   Fasting Lipid Panel:  Basename 11/19/11 0505  CHOL 136  HDL 53  LDLCALC 69  TRIG 68  CHOLHDL 2.6  LDLDIRECT --   Thyroid Function Tests: No results found for this basename: TSH,T4TOTAL,FREET3,T3FREE,THYROIDAB in the last 72 hours Anemia Panel: No results found for this basename: VITAMINB12,FOLATE,FERRITIN,TIBC,IRON,RETICCTPCT in the last 72 hours Coag Panel:   Lab Results  Component Value Date   INR 0.99 11/18/2011    RADIOLOGY: Dg Chest Portable 1 View  11/18/2011  *RADIOLOGY REPORT*  Clinical Data: Chest pain, cough, shortness of breath  PORTABLE CHEST - 1 VIEW  Comparison: 04/02/2011  Findings: Cardiomediastinal silhouette is stable.  Bilateral pleural calcifications are again noted.  Worsening pleural calcifications in the left upper hemithorax.  Stable bilateral basilar atelectasis or scarring.  No convincing pulmonary edema or focal infiltrate.  IMPRESSION: Worsening pleural calcifications in the left upper hemithorax. Stable bilateral basilar atelectasis or scarring.  No convincing pulmonary edema or focal infiltrate.  Original Report Authenticated By: Natasha Mead, M.D.  ECG shows sinus bradycardia with mild inferolateral ST depressions   ASSESSMENT: Non-ST elevation MI  PLAN: Enzymes did increase with his episode of chest pain yesterday.  Occluded LAD (collaterals to the distal)  with significant distal left main disease and mid circumflex disease by cath yesterday.  We discussed CABG vs. High risk PCI vs. Medical therapy.  At this point, he would like to try medical therapy which I think is reasonable.  He and his daughter would like to talk to CT surgery , at least for additional information regarding CABG.  PCI would be quite high risk due to involvement of both branches and the distal left main.  Will stop IV NTG today.  Continue Imdur.  Will hold off on Plavix for  now.  Could add Norvasc for BP if it remains elevated.  May help with angina.  Will plan to watch in hospital today. Anemia improved today.   Corky Crafts., MD  11/21/2011  8:09 AM

## 2011-11-21 NOTE — Consult Note (Signed)
[address]            [place] [address]          [phone]      Reason for Consult: Severe multivessel coronary disease status post non-ST segment elevation MI Referring Physician: Everette Rank, MD  Ryan Pacheco is an 78 y.o. male.   HPI:   78 year old man with no prior cardiac history.  He had a cold about a week ago.  He was coughing a lot and developed some chest pain with coughing.  He was in the upper part of his chest.  The cold resolved but the chest pain persisted.  It was somewhat better when he would rest and worse when he would walk.  He came to the emergency room be checked out after calling his primary care physician.  His initial cardiac enzyme was abnormal at 0.17 for the troponin.  His ECG was not diagnostic for an MI. Due to his age and his desire for medical therapy he was continued on that therapeutic plan but continued to have some on-and-off chest discomfort in the upper portion of his chest.  It is not severe. Cardiac catheterization was performed and shows severe multivessel coronary disease as noted below.    Past Medical History  Diagnosis Date  . Edema   . Acid reflux   . Benign prostatic hypertrophy   . Bradycardia   . Embolism - blood clot ~ 2000    "behind left knee"  . History of asbestos exposure     "have some lung disease from years of exposure"  . Angina   . Myocardial infarction 11/17/10     NSTEMI  . Arthritis     Past Surgical History  Procedure Date  . Cholecystectomy 1980's  . Cataract extraction, bilateral ~ 2010  . Blepharoplasty     left eye    History reviewed. No pertinent family history.  Social History:  reports that he has quit smoking. His smoking use included Cigars. He has quit using smokeless tobacco. His smokeless tobacco use included Chew. He reports that he does not drink alcohol or use illicit drugs.  Allergies: No Known Allergies  Medications:   Prior to Admission:    Prescriptions prior to admission  Medication Sig Dispense Refill  . aspirin EC 81 MG tablet Take 81 mg by mouth daily.      Marland Kitchen esomeprazole (NEXIUM) 40 MG capsule Take 40 mg by mouth daily before breakfast.      . furosemide (LASIX) 40 MG tablet Take 40 mg by mouth daily.      . Multiple Vitamin (MULITIVITAMIN WITH MINERALS) TABS Take 1 tablet by mouth daily.      . Tamsulosin HCl (FLOMAX) 0.4 MG CAPS Take 0.4 mg by mouth daily after breakfast.       Scheduled:   . amLODipine  2.5 mg Oral Daily  . aspirin EC  81 mg Oral Daily  . furosemide  40 mg Oral Daily  . isosorbide mononitrate  60 mg Oral Daily  . metoprolol tartrate  12.5 mg Oral BID  . mulitivitamin with minerals  1 tablet Oral Daily  . pantoprazole  40 mg Oral Daily  . Tamsulosin HCl  0.4 mg Oral QPC breakfast   Continuous:   . sodium chloride 1 mL/kg/hr (11/20/11 1400)  . sodium chloride 10 mL/hr at 11/20/11 2303  .  DISCONTD: nitroGLYCERIN Stopped (11/21/11 0830)   JWJ:[id number], ALPRAZolam, nitroGLYCERIN, ondansetron (ZOFRAN) IV  Results for orders placed during the hospital encounter of 11/18/11 (from the past 48 hour(s))  CARDIAC PANEL(CRET KIN+CKTOT+MB+TROPI)     Status: Abnormal   Collection Time   11/19/11  6:45 PM      Component Value Range Comment   Total CK 275 (*) 7 - 232 (U/L)    CK, MB 15.0 (*) 0.3 - 4.0 (ng/mL) CRITICAL VALUE NOTED.  VALUE IS CONSISTENT WITH PREVIOUSLY REPORTED AND CALLED VALUE.   Troponin I 4.47 (*) <0.30 (ng/mL)    Relative Index 5.5 (*) 0.0 - 2.5    URINALYSIS, ROUTINE W REFLEX MICROSCOPIC     Status: Abnormal   Collection Time   11/19/11 10:01 PM      Component Value Range Comment   Color, Urine YELLOW  YELLOW     APPearance CLOUDY (*) CLEAR     Specific Gravity, Urine 1.012  1.005 - 1.030     pH 6.0  5.0 - 8.0     Glucose, UA NEGATIVE  NEGATIVE (mg/dL)    Hgb urine dipstick LARGE (*) NEGATIVE     Bilirubin Urine NEGATIVE  NEGATIVE     Ketones, ur NEGATIVE  NEGATIVE (mg/dL)     Protein, ur NEGATIVE  NEGATIVE (mg/dL)    Urobilinogen, UA 0.2  0.0 - 1.0 (mg/dL)    Nitrite NEGATIVE  NEGATIVE     Leukocytes, UA NEGATIVE  NEGATIVE    URINE MICROSCOPIC-ADD ON     Status: Normal   Collection Time   11/19/11 10:01 PM      Component Value Range Comment   Squamous Epithelial / LPF RARE  RARE     WBC, UA 0-2  <3 (WBC/hpf)    RBC / HPF 21-50  <3 (RBC/hpf)   HEPARIN LEVEL (UNFRACTIONATED)     Status: Abnormal   Collection Time   11/20/11  6:10 AM      Component Value Range Comment   Heparin Unfractionated <0.10 (*) 0.30 - 0.70 (IU/mL)   CBC     Status: Abnormal   Collection Time   11/20/11  6:10 AM      Component Value Range Comment   WBC 8.6  4.0 - 10.5 (K/uL)    RBC 3.76 (*) 4.22 - 5.81 (MIL/uL)    Hemoglobin 11.6 (*) 13.0 - 17.0 (g/dL)    HCT 08.6 (*) 57.8 - 52.0 (%)    MCV 91.5  78.0 - 100.0 (fL)    MCH 30.9  26.0 - 34.0 (pg)    MCHC 33.7  30.0 - 36.0 (g/dL)    RDW 46.9  62.9 - 52.8 (%)    Platelets 159  150 - 400 (K/uL)   BASIC METABOLIC PANEL     Status: Abnormal   Collection Time   11/20/11  6:10 AM      Component Value Range Comment   Sodium 141  135 - 145 (mEq/L)    Potassium 3.9  3.5 - 5.1 (mEq/L)    Chloride 108  96 - 112 (mEq/L)    CO2 25  19 - 32 (mEq/L)    Glucose, Bld 100 (*) 70 - 99 (mg/dL)    BUN 16  6 - 23 (mg/dL)    Creatinine, Ser 4.13  0.50 - 1.35 (mg/dL)    Calcium 9.0  8.4 - 10.5 (mg/dL)    GFR calc non Af Amer 64 (*) >90 (mL/min)    GFR calc Af Denyse Dago  75 (*) >90 (mL/min)   CBC     Status: Abnormal   Collection Time   11/21/11  5:20 AM      Component Value Range Comment   WBC 9.7  4.0 - 10.5 (K/uL)    RBC 3.92 (*) 4.22 - 5.81 (MIL/uL)    Hemoglobin 12.7 (*) 13.0 - 17.0 (g/dL)    HCT 16.1 (*) 09.6 - 52.0 (%)    MCV 92.1  78.0 - 100.0 (fL)    MCH 32.4  26.0 - 34.0 (pg)    MCHC 35.2  30.0 - 36.0 (g/dL)    RDW 04.5  40.9 - 81.1 (%)    Platelets 176  150 - 400 (K/uL)   CARDIAC PANEL(CRET KIN+CKTOT+MB+TROPI)     Status: Abnormal    Collection Time   11/21/11  5:20 AM      Component Value Range Comment   Total CK 300 (*) 7 - 232 (U/L)    CK, MB 29.8 (*) 0.3 - 4.0 (ng/mL) CRITICAL VALUE NOTED.  VALUE IS CONSISTENT WITH PREVIOUSLY REPORTED AND CALLED VALUE.   Troponin I 5.38 (*) <0.30 (ng/mL)    Relative Index 9.9 (*) 0.0 - 2.5    BASIC METABOLIC PANEL     Status: Abnormal   Collection Time   11/21/11  5:20 AM      Component Value Range Comment   Sodium 142  135 - 145 (mEq/L)    Potassium 4.3  3.5 - 5.1 (mEq/L)    Chloride 109  96 - 112 (mEq/L)    CO2 26  19 - 32 (mEq/L)    Glucose, Bld 105 (*) 70 - 99 (mg/dL)    BUN 15  6 - 23 (mg/dL)    Creatinine, Ser 9.14  0.50 - 1.35 (mg/dL)    Calcium 9.1  8.4 - 10.5 (mg/dL)    GFR calc non Af Amer 74 (*) >90 (mL/min)    GFR calc Af Amer 86 (*) >90 (mL/min)     No results found.  Review of Systems  Constitutional: Negative.   HENT: Negative.   Eyes: Negative.   Respiratory: Negative.   Cardiovascular: Positive for chest pain and leg swelling. Negative for palpitations, orthopnea, claudication and PND.  Gastrointestinal: Negative.   Genitourinary: Negative.   Musculoskeletal: Positive for joint pain.  Skin: Negative.   Neurological: Negative.   Endo/Heme/Allergies: Negative.   Psychiatric/Behavioral: Negative.    Blood pressure 129/64, pulse 45, temperature 97.3 F (36.3 C), temperature source Oral, resp. rate 14, height 5\' 8"  (1.727 m), weight 80.7 kg (177 lb 14.6 oz), SpO2 97.00%. Physical Exam  Constitutional: He is oriented to person, place, and time. He appears well-developed and well-nourished.  HENT:  Head: Normocephalic and atraumatic.  Mouth/Throat: Oropharynx is clear and moist.  Eyes: EOM are normal. Pupils are equal, round, and reactive to light.  Neck: Neck supple. No JVD present. No thyromegaly present.  Cardiovascular: Normal rate and regular rhythm.   Murmur heard.      2/5 SEM over aorta  Respiratory: Breath sounds normal.  GI: Soft. Bowel  sounds are normal. He exhibits no mass. There is no tenderness.  Musculoskeletal: Normal range of motion. He exhibits edema.       Left lower leg with brownish skin discoloration in both lower legs  Lymphadenopathy:    He has no cervical adenopathy.  Neurological: He is alert and oriented to person, place, and time. He has normal strength. No cranial nerve deficit or sensory deficit.  Skin:  Skin is warm and dry.  Psychiatric: He has a normal mood and affect.    Cardiac Cath:  HEMODYNAMICS:  Aortic pressure was 166/76; LV pressure was 166/13; LVEDP 23.  There was no gradient between the left ventricle and aorta.    ANGIOGRAPHIC DATA:   The left main coronary artery has a 50-60% distal stenosis which extends into both the ostium of the LAD and circumflex .  The left anterior descending artery has an ostial 70% stenosis extending back into the distal left main.  There is a large large septal perforator which comes off of the proximal LAD.  There is a large first diagonal.  Just before the first diagonal there is the beginning of an 80% stenosis.  after the origin of the large diagonal , the LAD is occluded.  The apical LAD fills by left to left collaterals.  The left circumflex artery is a large vessel.  The ostial circumflex has a 70% stenosis.  In the mid circumflex, a large OM1 originates.  Just before the bifurcation of the circumflex and OM1, there is a 80% stenosis which extends somewhat into both branches.  After the stenosis, there is a medium-sized OM 2 which appears patent.  The right coronary artery is a large dominant vessel.  There is mild to moderate diffuse atherosclerosis throughout the RCA system.  The PDA and posterior lateral artery or medium-sized and widely patent.Marland Kitchen  LEFT VENTRICULOGRAM:  Left ventricular angiogram was done in the 30 RAO projection and revealed overall normal left ventricular  systolic function with an estimated ejection fraction of 50 %.  LVEDP was 18 mmHg.   there was apical hypokinesis.  IMPRESSIONS:    1. 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  The mid LAD is occluded.  The distal LAD fills by left to left collaterals.  There is a 80% mid circumflex stenosis involving the bifurcation of the circumflex with OM1.  2. Overall Normal left ventricular systolic function.  LVEDP 18 mmHg.  Ejection fraction 50 %.  There is apical hypokinesis consistent with infarct from the occluded mid LAD.   RECOMMENDATION:  The patient was pain-free on the table.  We did not pursue any type of intervention at this time.  Any type of intervention would be very high risk given that his distal left main is involved and he has disease in both the LAD and circumflex.  We'll discuss options with the family.  Based on our previous discussions, I think they're leaning more towards medical therapy.  We'll move him to the CCU and continue nitroglycerin drip.  A more inclined to start Plavix now if he decides not to pursue revascularization.  If they are open to talk with the surgeons, we will have him consult.  He is in pretty good health for his age.  Continue medical therapy including statin, beta blocker, nitrates, aspirin.   Assessment/Plan:  Severe multivessel CAD with unstable angina s/p NSTEMI.  I think CABG is the best treatment for this elderly but still independent and overall fairly healthy gentleman.  He has failed a trial of medical therapy while here in the hospital and has a large amount of myocardium at risk. I discussed the cath findings and treatment recommendations with the patient and he would like me to explain it all to his children tomorrow. If he decides to proceed with surgery I can do it Tuesday am.  Evelene Croon K 11/21/2011, 2:43  PM

## 2011-11-22 ENCOUNTER — Encounter (HOSPITAL_COMMUNITY): Payer: Medicare Other

## 2011-11-22 DIAGNOSIS — Z01810 Encounter for preprocedural cardiovascular examination: Secondary | ICD-10-CM

## 2011-11-22 LAB — CBC
Hemoglobin: 11.4 g/dL — ABNORMAL LOW (ref 13.0–17.0)
MCH: 30.7 pg (ref 26.0–34.0)
MCV: 91.6 fL (ref 78.0–100.0)
Platelets: 164 10*3/uL (ref 150–400)
RBC: 3.71 MIL/uL — ABNORMAL LOW (ref 4.22–5.81)
WBC: 8.3 10*3/uL (ref 4.0–10.5)

## 2011-11-22 LAB — BASIC METABOLIC PANEL
CO2: 26 mEq/L (ref 19–32)
Glucose, Bld: 98 mg/dL (ref 70–99)
Potassium: 4 mEq/L (ref 3.5–5.1)
Sodium: 139 mEq/L (ref 135–145)

## 2011-11-22 NOTE — Progress Notes (Signed)
SUBJECTIVE:  No  Chest pain last night.  Heparin stopped due to some hematuria. Walking halls without problems. OBJECTIVE:   Vitals:   Filed Vitals:   11/22/11 0500 11/22/11 0726 11/22/11 0810 11/22/11 1214  BP:  116/56  99/46  Pulse:    48  Temp:   97.3 F (36.3 C) 98 F (36.7 C)  TempSrc:  Oral Axillary Oral  Resp:  16  12  Height:      Weight: 81.4 kg (179 lb 7.3 oz)     SpO2:  96%  96%   I&O's:    Intake/Output Summary (Last 24 hours) at 11/22/11 1448 Last data filed at 11/22/11 1300  Gross per 24 hour  Intake   2040 ml  Output    600 ml  Net   1440 ml   TELEMETRY: Reviewed telemetry pt in normal sinus rhythm, bradycardia during sleep     PHYSICAL EXAM General: Well developed, well nourished, in no acute distress Head:    Normal cephalic and atramatic  Lungs:   Clear bilaterally to auscultation  Heart:   HRRR S1 S2  Abdomen: , abdomen soft and non-tender Msk:  Back normal,  Normal strength and tone for age. Extremities:   Trace edema.  3+ right radial pulse Neuro: Alert and oriented X 3. Psych:  Good affect, responds appropriately   LABS: Basic Metabolic Panel:  Basename 11/22/11 0520 11/21/11 0520  NA 139 142  K 4.0 4.3  CL 104 109  CO2 26 26  GLUCOSE 98 105*  BUN 17 15  CREATININE 1.11 0.87  CALCIUM 9.2 9.1  MG -- --  PHOS -- --   Liver Function Tests: No results found for this basename: AST:2,ALT:2,ALKPHOS:2,BILITOT:2,PROT:2,ALBUMIN:2 in the last 72 hours No results found for this basename: LIPASE:2,AMYLASE:2 in the last 72 hours CBC:  Basename 11/22/11 0520 11/21/11 0520  WBC 8.3 9.7  NEUTROABS -- --  HGB 11.4* 12.7*  HCT 34.0* 36.1*  MCV 91.6 92.1  PLT 164 176   Cardiac Enzymes:  Basename 11/21/11 0520 11/19/11 1845  CKTOTAL 300* 275*  CKMB 29.8* 15.0*  CKMBINDEX -- --  TROPONINI 5.38* 4.47*   BNP: No components found with this basename: POCBNP:3 D-Dimer: No results found for this basename: DDIMER:2 in the last 72  hours Hemoglobin A1C: No results found for this basename: HGBA1C in the last 72 hours Fasting Lipid Panel: No results found for this basename: CHOL,HDL,LDLCALC,TRIG,CHOLHDL,LDLDIRECT in the last 72 hours Thyroid Function Tests: No results found for this basename: TSH,T4TOTAL,FREET3,T3FREE,THYROIDAB in the last 72 hours Anemia Panel: No results found for this basename: VITAMINB12,FOLATE,FERRITIN,TIBC,IRON,RETICCTPCT in the last 72 hours Coag Panel:   Lab Results  Component Value Date   INR 0.99 11/18/2011    RADIOLOGY: Dg Chest Portable 1 View  11/18/2011  *RADIOLOGY REPORT*  Clinical Data: Chest pain, cough, shortness of breath  PORTABLE CHEST - 1 VIEW  Comparison: 04/02/2011  Findings: Cardiomediastinal silhouette is stable.  Bilateral pleural calcifications are again noted.  Worsening pleural calcifications in the left upper hemithorax.  Stable bilateral basilar atelectasis or scarring.  No convincing pulmonary edema or focal infiltrate.  IMPRESSION: Worsening pleural calcifications in the left upper hemithorax. Stable bilateral basilar atelectasis or scarring.  No convincing pulmonary edema or focal infiltrate.  Original Report Authenticated By: Natasha Mead, M.D.   ECG shows sinus bradycardia with mild inferolateral ST depressions   ASSESSMENT: Non-ST elevation MI  PLAN: Enzymes did increase with his episode of chest pain 2 days ago.  Occluded  LAD (collaterals to the distal)  with significant distal left main disease and mid circumflex disease by cath.  He is leaning towards having CABG.  He and his daughter would like to talk to CT surgery , at least for additional information regarding CABG.  PCI would be quite high risk due to involvement of both branches and the distal left main.    Continue Imdur.  CABG would be on Tuesday if it is done. Anemia, Hbg decreased again today.  Corky Crafts., MD  11/22/2011  2:48 PM

## 2011-11-23 LAB — BASIC METABOLIC PANEL
BUN: 21 mg/dL (ref 6–23)
Chloride: 107 mEq/L (ref 96–112)
Creatinine, Ser: 1.09 mg/dL (ref 0.50–1.35)
GFR calc Af Amer: 67 mL/min — ABNORMAL LOW (ref >90)
Glucose, Bld: 97 mg/dL (ref 70–99)
Potassium: 4.3 mEq/L (ref 3.5–5.1)

## 2011-11-23 LAB — CBC
HCT: 35.4 % — ABNORMAL LOW (ref 39.0–52.0)
Hemoglobin: 11.8 g/dL — ABNORMAL LOW (ref 13.0–17.0)
MCHC: 33.3 g/dL (ref 30.0–36.0)
MCV: 92.2 fL (ref 78.0–100.0)
RDW: 14.3 % (ref 11.5–15.5)
WBC: 6.6 10*3/uL (ref 4.0–10.5)

## 2011-11-23 LAB — CARDIAC PANEL(CRET KIN+CKTOT+MB+TROPI)
Relative Index: INVALID (ref 0.0–2.5)
Troponin I: 1.11 ng/mL (ref <0.30)

## 2011-11-23 MED ORDER — ROSUVASTATIN CALCIUM 10 MG PO TABS
10.0000 mg | ORAL_TABLET | Freq: Every day | ORAL | Status: DC
  Administered 2011-11-23 – 2011-11-25 (×3): 10 mg via ORAL
  Filled 2011-11-23 (×4): qty 1

## 2011-11-23 NOTE — Progress Notes (Addendum)
  VASCULAR LAB PRELIMINARY PRELIMINARY PRELIMINARY   Pre-op Cardiac Surgery  Carotid Findings:   Bilateral:  No evidence of hemodynamically significant internal carotid artery stenosis.   Vertebral artery flow is antegrade.      Upper Extremity Right Left  Brachial Pressures 128  triphasic 122   triphasic  Radial Waveforms triphasic triphasic  Ulnar Waveforms triphasic triphasic  Palmar Arch (Allen's Test) normal normal     Lower  Extremity Right Left  Peroneal      Anterior Tibial 160 126  Posterior Tibial 156 138  Ankle/Brachial Indices 1.25 1.08    Treyana Sturgell, Karsten Ro, RVT Florestine Avers, RVT 11/23/2011 11:11 AM

## 2011-11-23 NOTE — Progress Notes (Signed)
Subjective:  78 year old with NSTEMI. Awaiting CABG.  Doing well, no complaints of CP, SOB, N/V. No bleeding. Had hematuria earlier. Ambulating well.   Objective:  Vital Signs in the last 24 hours: Temp:  [97.2 F (36.2 C)-98.1 F (36.7 C)] 97.3 F (36.3 C) (01/26 0810) Pulse Rate:  [47-54] 47  (01/25 2149) Resp:  [12-18] 18  (01/26 0410) BP: (99-146)/(46-89) 146/89 mmHg (01/26 0908) SpO2:  [94 %-96 %] 94 % (01/26 0400) Weight:  [82 kg (180 lb 12.4 oz)] 82 kg (180 lb 12.4 oz) (01/26 0500)  Intake/Output from previous day: 01/25 0701 - 01/26 0700 In: 1240 [P.O.:1140] Out: -    Physical Exam: General: Well developed, well nourished, in no acute distress. Head:  Normocephalic and atraumatic. Lungs: Clear to auscultation and percussion. Heart: Normal S1 and S2.  No murmur, rubs or gallops.  Pulses: Pulses normal in all 4 extremities. Abdomen: soft, non-tender, positive bowel sounds. Extremities: No clubbing or cyanosis. Trace edema. Neurologic: Alert and oriented x 3.    Lab Results:  Basename 11/23/11 0500 11/22/11 0520  WBC 6.6 8.3  HGB 11.8* 11.4*  PLT 186 164    Basename 11/23/11 0500 11/22/11 0520  NA 144 139  K 4.3 4.0  CL 107 104  CO2 25 26  GLUCOSE 97 98  BUN 21 17  CREATININE 1.09 1.11    Basename 11/23/11 0500 11/21/11 0520  TROPONINI 1.11* 5.38*   Telemetry: No adverse arrhythmias  Personally viewed.  ECG: SB ST depression inferolateral.   Cardiac Studies:  Cath - severe 3v dz.   Assessment/Plan:   NSTEMI - ASA, metoprolol, Imdur. Heparin IV now off. Awaiting CABG Tuesday. No Plavix because of CABG. Will add statin.  CAD - await CABG. Severe. Cath reviewed.   SKAINS, MARK 11/23/2011, 9:20 AM

## 2011-11-24 LAB — CBC
HCT: 35.8 % — ABNORMAL LOW (ref 39.0–52.0)
Hemoglobin: 12 g/dL — ABNORMAL LOW (ref 13.0–17.0)
RBC: 3.89 MIL/uL — ABNORMAL LOW (ref 4.22–5.81)

## 2011-11-24 NOTE — Progress Notes (Signed)
HR on tele 41 SB.  DC metoprolol 12.5 BID.

## 2011-11-24 NOTE — Progress Notes (Signed)
Subjective:  78 year old with NSTEMI. Awaiting CABG.  Doing well, no complaints of CP, SOB, N/V. Ambulating well. Had his 89th birthday yesterday. Offered me some cake.    Objective:  Vital Signs in the last 24 hours: Temp:  [97.6 F (36.4 C)-98 F (36.7 C)] 97.8 F (36.6 C) (01/27 0814) Pulse Rate:  [65] 65  (01/26 2209) Resp:  [16-18] 16  (01/27 0355) BP: (105-150)/(52-75) 120/52 mmHg (01/27 0750) SpO2:  [96 %-97 %] 97 % (01/27 0814) Weight:  [81.4 kg (179 lb 7.3 oz)] 81.4 kg (179 lb 7.3 oz) (01/27 0500)  Intake/Output from previous day: 01/26 0701 - 01/27 0700 In: 400 [P.O.:400] Out: -    Physical Exam: General: Well developed, well nourished, in no acute distress. Head:  Normocephalic and atraumatic. Lungs: Clear to auscultation and percussion. Heart: Normal S1 and S2.  No murmur, rubs or gallops.  Pulses: Pulses normal in all 4 extremities. Abdomen: soft, non-tender, positive bowel sounds. Extremities: No clubbing or cyanosis. No edema. Neurologic: Alert and oriented x 3.    Lab Results:  Basename 11/24/11 0525 11/23/11 0500  WBC 6.8 6.6  HGB 12.0* 11.8*  PLT 180 186    Basename 11/23/11 0500 11/22/11 0520  NA 144 139  K 4.3 4.0  CL 107 104  CO2 25 26  GLUCOSE 97 98  BUN 21 17  CREATININE 1.09 1.11    Basename 11/23/11 0500  TROPONINI 1.11*    Assessment/Plan:   NSTEMI - ASA, metoprolol, Imdur. Heparin IV now off. Awaiting CABG Tuesday. No Plavix because of CABG. Statin.   CAD - await CABG. Severe. Cath reviewed    Donato Schultz 11/24/2011, 9:37 AM

## 2011-11-25 ENCOUNTER — Inpatient Hospital Stay (HOSPITAL_COMMUNITY): Payer: Medicare Other

## 2011-11-25 LAB — TYPE AND SCREEN
ABO/RH(D): A POS
Antibody Screen: NEGATIVE

## 2011-11-25 LAB — CBC
HCT: 36.1 % — ABNORMAL LOW (ref 39.0–52.0)
Hemoglobin: 12.1 g/dL — ABNORMAL LOW (ref 13.0–17.0)
MCH: 30.9 pg (ref 26.0–34.0)
MCV: 92.1 fL (ref 78.0–100.0)
RBC: 3.92 MIL/uL — ABNORMAL LOW (ref 4.22–5.81)
WBC: 6.4 10*3/uL (ref 4.0–10.5)

## 2011-11-25 MED ORDER — SODIUM CHLORIDE 0.9 % IV SOLN
0.1000 ug/kg/h | INTRAVENOUS | Status: DC
  Filled 2011-11-25: qty 4

## 2011-11-25 MED ORDER — SODIUM CHLORIDE 0.9 % IV SOLN
INTRAVENOUS | Status: DC
  Filled 2011-11-25: qty 40

## 2011-11-25 MED ORDER — TEMAZEPAM 15 MG PO CAPS: 15.0000 mg | ORAL_CAPSULE | Freq: Once | ORAL | Status: AC | PRN

## 2011-11-25 MED ORDER — EPINEPHRINE HCL 1 MG/ML IJ SOLN
0.5000 ug/min | INTRAVENOUS | Status: DC
  Filled 2011-11-25: qty 4

## 2011-11-25 MED ORDER — MAGNESIUM SULFATE 50 % IJ SOLN
40.0000 meq | INTRAMUSCULAR | Status: DC
  Filled 2011-11-25: qty 10

## 2011-11-25 MED ORDER — VANCOMYCIN HCL 1000 MG IV SOLR
1250.0000 mg | INTRAVENOUS | Status: AC
  Administered 2011-11-26: 1250 mg via INTRAVENOUS
  Filled 2011-11-25: qty 1250

## 2011-11-25 MED ORDER — CHLORHEXIDINE GLUCONATE 4 % EX LIQD
60.0000 mL | Freq: Once | CUTANEOUS | Status: AC
  Administered 2011-11-26: 4 via TOPICAL

## 2011-11-25 MED ORDER — PHENYLEPHRINE HCL 10 MG/ML IJ SOLN
30.0000 ug/min | INTRAVENOUS | Status: DC
  Administered 2011-11-26: 2 ug/min via INTRAVENOUS
  Filled 2011-11-25: qty 2

## 2011-11-25 MED ORDER — NITROGLYCERIN 5 MG/ML IV SOLN
INTRAVENOUS | Status: AC
  Administered 2011-11-26: 09:00:00
  Filled 2011-11-25: qty 2.5

## 2011-11-25 MED ORDER — CHLORHEXIDINE GLUCONATE 4 % EX LIQD
60.0000 mL | Freq: Once | CUTANEOUS | Status: DC
  Filled 2011-11-25: qty 60

## 2011-11-25 MED ORDER — BISACODYL 5 MG PO TBEC
5.0000 mg | DELAYED_RELEASE_TABLET | Freq: Once | ORAL | Status: DC
  Filled 2011-11-25: qty 1

## 2011-11-25 MED ORDER — CHLORHEXIDINE GLUCONATE 4 % EX LIQD
60.0000 mL | Freq: Once | CUTANEOUS | Status: AC
  Administered 2011-11-25: 4 via TOPICAL
  Filled 2011-11-25: qty 60

## 2011-11-25 MED ORDER — POTASSIUM CHLORIDE 2 MEQ/ML IV SOLN
80.0000 meq | INTRAVENOUS | Status: DC
  Filled 2011-11-25: qty 40

## 2011-11-25 MED ORDER — DIAZEPAM 5 MG PO TABS
5.0000 mg | ORAL_TABLET | Freq: Once | ORAL | Status: AC
  Administered 2011-11-26: 5 mg via ORAL
  Filled 2011-11-25: qty 1

## 2011-11-25 MED ORDER — DOPAMINE-DEXTROSE 3.2-5 MG/ML-% IV SOLN
2.0000 ug/kg/min | INTRAVENOUS | Status: DC
  Filled 2011-11-25: qty 250

## 2011-11-25 MED ORDER — DEXTROSE 5 % IV SOLN
750.0000 mg | INTRAVENOUS | Status: DC
  Filled 2011-11-25: qty 750

## 2011-11-25 MED ORDER — NITROGLYCERIN IN D5W 200-5 MCG/ML-% IV SOLN
2.0000 ug/min | INTRAVENOUS | Status: DC
  Filled 2011-11-25: qty 250

## 2011-11-25 MED ORDER — ALBUTEROL SULFATE (5 MG/ML) 0.5% IN NEBU
2.5000 mg | INHALATION_SOLUTION | Freq: Once | RESPIRATORY_TRACT | Status: AC
  Administered 2011-11-25: 2.5 mg via RESPIRATORY_TRACT

## 2011-11-25 MED ORDER — METOPROLOL TARTRATE 12.5 MG HALF TABLET
12.5000 mg | ORAL_TABLET | Freq: Once | ORAL | Status: DC
  Filled 2011-11-25: qty 1

## 2011-11-25 MED ORDER — CEFUROXIME SODIUM 1.5 G IJ SOLR
1.5000 g | INTRAMUSCULAR | Status: AC
  Administered 2011-11-26: 1.5 g via INTRAVENOUS
  Administered 2011-11-26: .75 g via INTRAVENOUS
  Filled 2011-11-25: qty 1.5

## 2011-11-25 MED ORDER — SODIUM CHLORIDE 0.9 % IV SOLN
INTRAVENOUS | Status: DC
  Administered 2011-11-26: 1.4 [IU]/h via INTRAVENOUS
  Filled 2011-11-25: qty 1

## 2011-11-25 NOTE — Progress Notes (Signed)
   CARE MANAGEMENT NOTE 11/25/2011  Patient:  Ryan Pacheco, Ryan Pacheco   Account Number:  [id number]  Date Initiated:  11/25/2011  Documentation initiated by:  GRAVES-BIGELOW,Usbaldo Pannone  Subjective/Objective Assessment:   Pt admitted with cp. Per  MD notes plan for CABG 11-26-11.     Action/Plan:   Anticipated DC Date:  12/02/2011   Anticipated DC Plan:  HOME W HOME HEALTH SERVICES      DC Planning Services  CM consult      Choice offered to / List presented to:             Status of service:  In process, will continue to follow Medicare Important Message given?   (If response is "NO", the following Medicare IM given date fields will be blank) Date Medicare IM given:   Date Additional Medicare IM given:    Discharge Disposition:    Per UR Regulation:    Comments:  11-25-11 1538 Tomi Bamberger, RN,BSN [phone] CM will continue to monitor for d/c disposition.

## 2011-11-25 NOTE — Plan of Care (Signed)
Problem: Phase I Progression Outcomes Goal: Verbalized level of anxiety/depression Outcome: Progressing Pt states he is not worried and tries not to think about it much Goal: Last BM documented (see I&O) Outcome: Completed/Met Date Met:  11/25/11 LBM 11/24/2011 Goal: Initial discharge plan identified Outcome: Progressing Pt states that he plans to go home with his daughter and is hoping not to have to go to a facility to recover/rehab

## 2011-11-25 NOTE — Progress Notes (Signed)
5 Days Post-Op Procedure(s) (LRB): LEFT HEART CATHETERIZATION WITH CORONARY ANGIOGRAM (N/A) Subjective: No complaints  Objective: Vital signs in last 24 hours: Temp:  [97.3 F (36.3 C)-98.1 F (36.7 C)] 97.8 F (36.6 C) (01/28 1523) Cardiac Rhythm:  [-] Sinus bradycardia (01/28 1523) Resp:  [15-16] 16  (01/28 1523) BP: (106-157)/(45-62) 112/48 mmHg (01/28 1523) SpO2:  [95 %-98 %] 97 % (01/28 1523) Weight:  [81.6 kg (179 lb 14.3 oz)] 81.6 kg (179 lb 14.3 oz) (01/28 0500)  Hemodynamic parameters for last 24 hours:    Intake/Output from previous day: 01/27 0701 - 01/28 0700 In: 1100 [P.O.:1100] Out: -  Intake/Output this shift: Total I/O In: 480 [P.O.:480] Out: -   General appearance: alert and cooperative Heart: regular rate and rhythm, S1, S2 normal, no murmur, click, rub or gallop Lungs: clear to auscultation bilaterally  Lab Results:  Basename 11/25/11 0645 11/24/11 0525  WBC 6.4 6.8  HGB 12.1* 12.0*  HCT 36.1* 35.8*  PLT 188 180   BMET:  Basename 11/23/11 0500  NA 144  K 4.3  CL 107  CO2 25  GLUCOSE 97  BUN 21  CREATININE 1.09  CALCIUM 9.4    PT/INR: No results found for this basename: LABPROT,INR in the last 72 hours ABG No results found for this basename: phart, pco2, po2, hco3, tco2, acidbasedef, o2sat   CBG (last 3)  No results found for this basename: GLUCAP:3 in the last 72 hours  Assessment/Plan: S/P Procedure(s) (LRB): LEFT HEART CATHETERIZATION WITH CORONARY ANGIOGRAM (N/A) Stable.  Plan CABG in am. Patient and daughter have no further questions.   LOS: 7 days    Harish Bram K 11/25/2011

## 2011-11-25 NOTE — Progress Notes (Addendum)
SUBJECTIVE:  No  Chest pain last night.  Heparin stopped due to some hematuria. Walking halls without problems. OBJECTIVE:   Vitals:   Filed Vitals:   11/24/11 2337 11/25/11 0337 11/25/11 0500 11/25/11 0755  BP: 112/45 106/48  157/62  Pulse:      Temp: 97.6 F (36.4 C) 98.1 F (36.7 C)  97.3 F (36.3 C)  TempSrc: Oral Oral  Oral  Resp:    15  Height:      Weight:   81.6 kg (179 lb 14.3 oz)   SpO2: 97% 95%  98%   I&O's:    Intake/Output Summary (Last 24 hours) at 11/25/11 0849 Last data filed at 11/25/11 0630  Gross per 24 hour  Intake    920 ml  Output      0 ml  Net    920 ml   TELEMETRY: Reviewed telemetry pt in normal sinus rhythm, bradycardia during sleep     PHYSICAL EXAM General: Well developed, well nourished, in no acute distress Head:    Normal cephalic and atramatic  Lungs:   Right basilar crackles  Heart:   HRRR S1 S2  Abdomen: , abdomen soft and non-tender Msk:  Back normal,  Normal strength and tone for age. Extremities:   Trace edema.  3+ right radial pulse Neuro: Alert and oriented X 3. Psych:  Good affect, responds appropriately   LABS: Basic Metabolic Panel:  Basename 11/23/11 0500  NA 144  K 4.3  CL 107  CO2 25  GLUCOSE 97  BUN 21  CREATININE 1.09  CALCIUM 9.4  MG --  PHOS --   Liver Function Tests: No results found for this basename: AST:2,ALT:2,ALKPHOS:2,BILITOT:2,PROT:2,ALBUMIN:2 in the last 72 hours No results found for this basename: LIPASE:2,AMYLASE:2 in the last 72 hours CBC:  Basename 11/25/11 0645 11/24/11 0525  WBC 6.4 6.8  NEUTROABS -- --  HGB 12.1* 12.0*  HCT 36.1* 35.8*  MCV 92.1 92.0  PLT 188 180   Cardiac Enzymes:  Basename 11/23/11 0500  CKTOTAL 89  CKMB 3.7  CKMBINDEX --  TROPONINI 1.11*   BNP: No components found with this basename: POCBNP:3 D-Dimer: No results found for this basename: DDIMER:2 in the last 72 hours Hemoglobin A1C: No results found for this basename: HGBA1C in the last 72  hours Fasting Lipid Panel: No results found for this basename: CHOL,HDL,LDLCALC,TRIG,CHOLHDL,LDLDIRECT in the last 72 hours Thyroid Function Tests: No results found for this basename: TSH,T4TOTAL,FREET3,T3FREE,THYROIDAB in the last 72 hours Anemia Panel: No results found for this basename: VITAMINB12,FOLATE,FERRITIN,TIBC,IRON,RETICCTPCT in the last 72 hours Coag Panel:   Lab Results  Component Value Date   INR 0.99 11/18/2011    RADIOLOGY: Dg Chest Portable 1 View  11/18/2011  *RADIOLOGY REPORT*  Clinical Data: Chest pain, cough, shortness of breath  PORTABLE CHEST - 1 VIEW  Comparison: 04/02/2011  Findings: Cardiomediastinal silhouette is stable.  Bilateral pleural calcifications are again noted.  Worsening pleural calcifications in the left upper hemithorax.  Stable bilateral basilar atelectasis or scarring.  No convincing pulmonary edema or focal infiltrate.  IMPRESSION: Worsening pleural calcifications in the left upper hemithorax. Stable bilateral basilar atelectasis or scarring.  No convincing pulmonary edema or focal infiltrate.  Original Report Authenticated By: Natasha Mead, M.D.   ECG shows sinus bradycardia with mild inferolateral ST depressions   ASSESSMENT: Non-ST elevation MI  PLAN:   Occluded LAD (collaterals to the distal)  with significant distal left main disease and mid circumflex disease by cath.  CABG scheduled for tomorrow.  No beta blocker due to bradycardia.    HTN-BP better after meds. Anemia, Hbg stable;  Use incentive spirometer  Corky Crafts., MD  11/25/2011  8:49 AM

## 2011-11-26 ENCOUNTER — Encounter (HOSPITAL_COMMUNITY): Payer: Self-pay

## 2011-11-26 ENCOUNTER — Encounter (HOSPITAL_COMMUNITY)
Admission: EM | Disposition: A | Discharge status: Skilled Nursing Facility | Payer: Self-pay | Source: Ambulatory Visit | Attending: Interventional Cardiology

## 2011-11-26 ENCOUNTER — Inpatient Hospital Stay (HOSPITAL_COMMUNITY): Payer: Medicare Other

## 2011-11-26 DIAGNOSIS — I2510 Atherosclerotic heart disease of native coronary artery without angina pectoris: Secondary | ICD-10-CM

## 2011-11-26 HISTORY — PX: CORONARY ARTERY BYPASS GRAFT: SHX141

## 2011-11-26 LAB — HEMOGLOBIN AND HEMATOCRIT, BLOOD: HCT: 25.7 % — ABNORMAL LOW (ref 39.0–52.0)

## 2011-11-26 LAB — CBC
HCT: 38.9 % — ABNORMAL LOW (ref 39.0–52.0)
HCT: 33.4 % — ABNORMAL LOW (ref 39.0–52.0)
Hemoglobin: 12.9 g/dL — ABNORMAL LOW (ref 13.0–17.0)
Hemoglobin: 10.3 g/dL — ABNORMAL LOW (ref 13.0–17.0)
Hemoglobin: 11.2 g/dL — ABNORMAL LOW (ref 13.0–17.0)
MCH: 30.7 pg (ref 26.0–34.0)
MCHC: 33.2 g/dL (ref 30.0–36.0)
MCHC: 33.9 g/dL (ref 30.0–36.0)
MCHC: 33.5 g/dL (ref 30.0–36.0)
MCV: 92.6 fL (ref 78.0–100.0)
Platelets: 120 10*3/uL — ABNORMAL LOW (ref 150–400)
RBC: 3.65 MIL/uL — ABNORMAL LOW (ref 4.22–5.81)
RDW: 14.1 % (ref 11.5–15.5)

## 2011-11-26 LAB — BASIC METABOLIC PANEL
BUN: 19 mg/dL (ref 6–23)
BUN: 14 mg/dL (ref 6–23)
CO2: 26 mEq/L (ref 19–32)
Calcium: 9.7 mg/dL (ref 8.4–10.5)
Chloride: 107 mEq/L (ref 96–112)
Creatinine, Ser: 1.08 mg/dL (ref 0.50–1.35)
Creatinine, Ser: 0.77 mg/dL (ref 0.50–1.35)
GFR calc Af Amer: 68 mL/min — ABNORMAL LOW (ref >90)
GFR calc Af Amer: 90 mL/min — ABNORMAL LOW (ref >90)
GFR calc non Af Amer: 58 mL/min — ABNORMAL LOW (ref >90)
Glucose, Bld: 104 mg/dL — ABNORMAL HIGH (ref 70–99)
Potassium: 4.9 mEq/L (ref 3.5–5.1)

## 2011-11-26 LAB — POCT I-STAT 3, ART BLOOD GAS (G3+)
Acid-Base Excess: 1 mmol/L (ref 0.0–2.0)
Bicarbonate: 22.4 mEq/L (ref 20.0–24.0)
Bicarbonate: 24.5 mEq/L — ABNORMAL HIGH (ref 20.0–24.0)
Bicarbonate: 26.5 mEq/L — ABNORMAL HIGH (ref 20.0–24.0)
O2 Saturation: 97 %
Patient temperature: 35.1
Patient temperature: 37.2
TCO2: 24 mmol/L (ref 0–100)
TCO2: 26 mmol/L (ref 0–100)
TCO2: 28 mmol/L (ref 0–100)
pCO2 arterial: 38.1 mmHg (ref 35.0–45.0)
pCO2 arterial: 49.6 mmHg — ABNORMAL HIGH (ref 35.0–45.0)
pCO2 arterial: 48 mmHg — ABNORMAL HIGH (ref 35.0–45.0)
pH, Arterial: 7.37 (ref 7.350–7.450)
pH, Arterial: 7.302 — ABNORMAL LOW (ref 7.350–7.450)
pO2, Arterial: 128 mmHg — ABNORMAL HIGH (ref 80.0–100.0)
pO2, Arterial: 101 mmHg — ABNORMAL HIGH (ref 80.0–100.0)

## 2011-11-26 LAB — POCT I-STAT, CHEM 8
BUN: 13 mg/dL (ref 6–23)
Chloride: 106 mEq/L (ref 96–112)
Creatinine, Ser: 0.8 mg/dL (ref 0.50–1.35)
Glucose, Bld: 132 mg/dL — ABNORMAL HIGH (ref 70–99)
Potassium: 5 mEq/L (ref 3.5–5.1)
Sodium: 142 mEq/L (ref 135–145)

## 2011-11-26 LAB — POCT I-STAT 4, (NA,K, GLUC, HGB,HCT): Potassium: 4.5 mEq/L (ref 3.5–5.1)

## 2011-11-26 LAB — PROTIME-INR
INR: 1.56 — ABNORMAL HIGH (ref 0.00–1.49)
Prothrombin Time: 19 seconds — ABNORMAL HIGH (ref 11.6–15.2)

## 2011-11-26 LAB — GLUCOSE, CAPILLARY
Glucose-Capillary: 117 mg/dL — ABNORMAL HIGH (ref 70–99)
Glucose-Capillary: 147 mg/dL — ABNORMAL HIGH (ref 70–99)

## 2011-11-26 LAB — MAGNESIUM: Magnesium: 3.1 mg/dL — ABNORMAL HIGH (ref 1.5–2.5)

## 2011-11-26 SURGERY — CORONARY ARTERY BYPASS GRAFTING (CABG)
Anesthesia: General | Site: Chest | Wound class: Clean

## 2011-11-26 MED ORDER — PROPOFOL 10 MG/ML IV EMUL
INTRAVENOUS | Status: DC | PRN
  Administered 2011-11-26: 90 mg via INTRAVENOUS

## 2011-11-26 MED ORDER — ASPIRIN 81 MG PO CHEW
324.0000 mg | CHEWABLE_TABLET | Freq: Every day | ORAL | Status: DC
  Administered 2011-11-30 – 2011-12-03 (×4): 324 mg
  Filled 2011-11-26: qty 4
  Filled 2011-11-26: qty 1
  Filled 2011-11-26: qty 4
  Filled 2011-11-26: qty 3
  Filled 2011-11-26: qty 4

## 2011-11-26 MED ORDER — ACETAMINOPHEN 650 MG RE SUPP
650.0000 mg | RECTAL | Status: AC
  Administered 2011-11-26: 650 mg via RECTAL

## 2011-11-26 MED ORDER — OXYCODONE HCL 5 MG PO TABS
5.0000 mg | ORAL_TABLET | ORAL | Status: DC | PRN
  Administered 2011-11-27: 5 mg via ORAL
  Filled 2011-11-26: qty 2

## 2011-11-26 MED ORDER — METOPROLOL TARTRATE 12.5 MG HALF TABLET
12.5000 mg | ORAL_TABLET | Freq: Two times a day (BID) | ORAL | Status: DC
  Filled 2011-11-26 (×3): qty 1

## 2011-11-26 MED ORDER — DOCUSATE SODIUM 100 MG PO CAPS
200.0000 mg | ORAL_CAPSULE | Freq: Every day | ORAL | Status: DC
  Administered 2011-11-27 – 2011-11-29 (×3): 200 mg via ORAL
  Filled 2011-11-26 (×4): qty 2

## 2011-11-26 MED ORDER — FENTANYL CITRATE 0.05 MG/ML IJ SOLN
INTRAMUSCULAR | Status: DC | PRN
  Administered 2011-11-26: 100 ug via INTRAVENOUS
  Administered 2011-11-26 (×2): 250 ug via INTRAVENOUS
  Administered 2011-11-26: 300 ug via INTRAVENOUS
  Administered 2011-11-26: 150 ug via INTRAVENOUS
  Administered 2011-11-26: 200 ug via INTRAVENOUS
  Administered 2011-11-26 (×2): 100 ug via INTRAVENOUS
  Administered 2011-11-26: 50 ug via INTRAVENOUS

## 2011-11-26 MED ORDER — GLYCOPYRROLATE 0.2 MG/ML IJ SOLN
INTRAMUSCULAR | Status: DC | PRN
  Administered 2011-11-26 (×2): 0.2 mg via INTRAVENOUS

## 2011-11-26 MED ORDER — SODIUM CHLORIDE 0.9 % IV SOLN: 250.0000 mL | INTRAVENOUS | Status: DC

## 2011-11-26 MED ORDER — SODIUM CHLORIDE 0.45 % IV SOLN
INTRAVENOUS | Status: DC
  Administered 2011-11-26: 20 mL/h via INTRAVENOUS
  Administered 2011-11-27: 10:00:00 via INTRAVENOUS

## 2011-11-26 MED ORDER — SODIUM CHLORIDE 0.9 % IJ SOLN: 3.0000 mL | INTRAMUSCULAR | Status: DC | PRN

## 2011-11-26 MED ORDER — MORPHINE SULFATE 4 MG/ML IJ SOLN
2.0000 mg | INTRAMUSCULAR | Status: DC | PRN
  Administered 2011-11-27: 4 mg via INTRAVENOUS
  Filled 2011-11-26: qty 1

## 2011-11-26 MED ORDER — ASPIRIN EC 325 MG PO TBEC
325.0000 mg | DELAYED_RELEASE_TABLET | Freq: Every day | ORAL | Status: DC
  Administered 2011-11-27 – 2011-11-29 (×3): 325 mg via ORAL
  Filled 2011-11-26 (×3): qty 1

## 2011-11-26 MED ORDER — DEXTROSE 5 % IV SOLN
1.5000 g | Freq: Two times a day (BID) | INTRAVENOUS | Status: AC
  Administered 2011-11-26 – 2011-11-28 (×4): 1.5 g via INTRAVENOUS
  Filled 2011-11-26 (×4): qty 1.5

## 2011-11-26 MED ORDER — BISACODYL 5 MG PO TBEC
10.0000 mg | DELAYED_RELEASE_TABLET | Freq: Every day | ORAL | Status: DC
  Administered 2011-11-27 – 2011-11-29 (×3): 10 mg via ORAL
  Filled 2011-11-26 (×3): qty 2

## 2011-11-26 MED ORDER — LACTATED RINGERS IV SOLN
INTRAVENOUS | Status: DC | PRN
  Administered 2011-11-26 (×3): via INTRAVENOUS

## 2011-11-26 MED ORDER — METOPROLOL TARTRATE 25 MG/10 ML ORAL SUSPENSION
12.5000 mg | Freq: Two times a day (BID) | ORAL | Status: DC
  Filled 2011-11-26 (×3): qty 5

## 2011-11-26 MED ORDER — SODIUM CHLORIDE 0.9 % IV SOLN
INTRAVENOUS | Status: DC
  Administered 2011-11-26: 10 mL/h via INTRAVENOUS

## 2011-11-26 MED ORDER — SODIUM BICARBONATE 8.4 % IV SOLN
50.0000 meq | Freq: Once | INTRAVENOUS | Status: AC
  Administered 2011-11-26: 50 meq via INTRAVENOUS
  Filled 2011-11-26: qty 50

## 2011-11-26 MED ORDER — MAGNESIUM SULFATE 40 MG/ML IJ SOLN
4.0000 g | Freq: Once | INTRAMUSCULAR | Status: AC
  Administered 2011-11-26: 4 g via INTRAVENOUS
  Filled 2011-11-26: qty 100

## 2011-11-26 MED ORDER — ROCURONIUM BROMIDE 100 MG/10ML IV SOLN
INTRAVENOUS | Status: DC | PRN
  Administered 2011-11-26: 20 mg via INTRAVENOUS
  Administered 2011-11-26: 50 mg via INTRAVENOUS
  Administered 2011-11-26: 20 mg via INTRAVENOUS

## 2011-11-26 MED ORDER — PANTOPRAZOLE SODIUM 40 MG PO TBEC
40.0000 mg | DELAYED_RELEASE_TABLET | Freq: Every day | ORAL | Status: DC
  Administered 2011-11-28 – 2011-12-03 (×6): 40 mg via ORAL
  Filled 2011-11-26 (×6): qty 1

## 2011-11-26 MED ORDER — MORPHINE SULFATE 2 MG/ML IJ SOLN: 1.0000 mg | INTRAMUSCULAR | Status: AC | PRN

## 2011-11-26 MED ORDER — MIDAZOLAM HCL 2 MG/2ML IJ SOLN: 2.0000 mg | INTRAMUSCULAR | Status: DC | PRN

## 2011-11-26 MED ORDER — ACETAMINOPHEN 160 MG/5ML PO SOLN: 650.0000 mg | ORAL | Status: AC

## 2011-11-26 MED ORDER — HEPARIN SODIUM (PORCINE) 1000 UNIT/ML IJ SOLN
INTRAMUSCULAR | Status: DC | PRN
  Administered 2011-11-26: 28000 [IU] via INTRAVENOUS

## 2011-11-26 MED ORDER — VECURONIUM BROMIDE 10 MG IV SOLR
INTRAVENOUS | Status: DC | PRN
  Administered 2011-11-26: 10 mg via INTRAVENOUS

## 2011-11-26 MED ORDER — HEMOSTATIC AGENTS (NO CHARGE) OPTIME
TOPICAL | Status: DC | PRN
  Administered 2011-11-26: 1 via TOPICAL

## 2011-11-26 MED ORDER — INSULIN ASPART 100 UNIT/ML ~~LOC~~ SOLN
0.0000 [IU] | SUBCUTANEOUS | Status: DC
  Administered 2011-11-27 (×2): 2 [IU] via SUBCUTANEOUS

## 2011-11-26 MED ORDER — ACETAMINOPHEN 500 MG PO TABS: 1000.0000 mg | ORAL_TABLET | Freq: Four times a day (QID) | ORAL | Status: DC

## 2011-11-26 MED ORDER — THROMBIN 20000 UNITS EX KIT
PACK | OROMUCOSAL | Status: DC | PRN
  Administered 2011-11-26: 09:00:00 via TOPICAL

## 2011-11-26 MED ORDER — SODIUM CHLORIDE 0.9 % IV SOLN
200.0000 ug | INTRAVENOUS | Status: DC | PRN
  Administered 2011-11-26: 0.3 ug/kg/h via INTRAVENOUS

## 2011-11-26 MED ORDER — SODIUM CHLORIDE 0.9 % IJ SOLN
3.0000 mL | Freq: Two times a day (BID) | INTRAMUSCULAR | Status: DC
  Administered 2011-11-27 – 2011-11-29 (×5): 3 mL via INTRAVENOUS

## 2011-11-26 MED ORDER — ACETAMINOPHEN 500 MG PO TABS
1000.0000 mg | ORAL_TABLET | Freq: Four times a day (QID) | ORAL | Status: AC
  Administered 2011-11-27 – 2011-12-01 (×16): 1000 mg via ORAL
  Filled 2011-11-26 (×20): qty 2

## 2011-11-26 MED ORDER — SODIUM CHLORIDE 0.9 % IV SOLN
100.0000 [IU] | INTRAVENOUS | Status: DC | PRN
  Administered 2011-11-26: 1.4 [IU]/h via INTRAVENOUS

## 2011-11-26 MED ORDER — SODIUM BICARBONATE 8.4 % IV SOLN
INTRAVENOUS | Status: AC
  Administered 2011-11-26: 50 meq
  Filled 2011-11-26: qty 50

## 2011-11-26 MED ORDER — SODIUM CHLORIDE 0.9 % IV SOLN
10.0000 g | INTRAVENOUS | Status: DC | PRN
  Administered 2011-11-26: 5 g/h via INTRAVENOUS

## 2011-11-26 MED ORDER — LACTATED RINGERS IV SOLN: 500.0000 mL | Freq: Once | INTRAVENOUS | Status: AC | PRN

## 2011-11-26 MED ORDER — ROSUVASTATIN CALCIUM 10 MG PO TABS
10.0000 mg | ORAL_TABLET | Freq: Every day | ORAL | Status: DC
  Administered 2011-11-27 – 2011-12-02 (×6): 10 mg via ORAL
  Filled 2011-11-26 (×7): qty 1

## 2011-11-26 MED ORDER — BISACODYL 10 MG RE SUPP: 10.0000 mg | Freq: Every day | RECTAL | Status: DC

## 2011-11-26 MED ORDER — 0.9 % SODIUM CHLORIDE (POUR BTL) OPTIME
TOPICAL | Status: DC | PRN
  Administered 2011-11-26: 5000 mL

## 2011-11-26 MED ORDER — INSULIN REGULAR BOLUS VIA INFUSION
0.0000 [IU] | Freq: Three times a day (TID) | INTRAVENOUS | Status: DC
  Filled 2011-11-26: qty 10

## 2011-11-26 MED ORDER — ALBUMIN HUMAN 5 % IV SOLN
250.0000 mL | INTRAVENOUS | Status: AC | PRN
  Administered 2011-11-26 (×2): 250 mL via INTRAVENOUS
  Filled 2011-11-26: qty 250

## 2011-11-26 MED ORDER — METOPROLOL TARTRATE 1 MG/ML IV SOLN: 2.5000 mg | INTRAVENOUS | Status: DC | PRN

## 2011-11-26 MED ORDER — MIDAZOLAM HCL 5 MG/5ML IJ SOLN
INTRAMUSCULAR | Status: DC | PRN
  Administered 2011-11-26 (×4): 1 mg via INTRAVENOUS
  Administered 2011-11-26: 4 mg via INTRAVENOUS

## 2011-11-26 MED ORDER — SODIUM CHLORIDE 0.9 % IV SOLN
INTRAVENOUS | Status: DC
  Filled 2011-11-26: qty 1

## 2011-11-26 MED ORDER — POTASSIUM CHLORIDE 10 MEQ/50ML IV SOLN: 10.0000 meq | INTRAVENOUS | Status: AC

## 2011-11-26 MED ORDER — INSULIN ASPART 100 UNIT/ML ~~LOC~~ SOLN
0.0000 [IU] | SUBCUTANEOUS | Status: AC
  Administered 2011-11-26 (×2): 2 [IU] via SUBCUTANEOUS
  Filled 2011-11-26: qty 3

## 2011-11-26 MED ORDER — PROTAMINE SULFATE 10 MG/ML IV SOLN
INTRAVENOUS | Status: DC | PRN
  Administered 2011-11-26: 20 mg via INTRAVENOUS

## 2011-11-26 MED ORDER — ONDANSETRON HCL 4 MG/2ML IJ SOLN: 4.0000 mg | Freq: Four times a day (QID) | INTRAMUSCULAR | Status: DC | PRN

## 2011-11-26 MED ORDER — LACTATED RINGERS IV SOLN
INTRAVENOUS | Status: DC
  Administered 2011-11-26 (×2): 20 mL/h via INTRAVENOUS

## 2011-11-26 MED ORDER — ACETAMINOPHEN 160 MG/5ML PO SOLN: 975.0000 mg | Freq: Four times a day (QID) | ORAL | Status: DC

## 2011-11-26 MED ORDER — NITROGLYCERIN IN D5W 200-5 MCG/ML-% IV SOLN: 0.0000 ug/min | INTRAVENOUS | Status: DC

## 2011-11-26 MED ORDER — 0.9 % SODIUM CHLORIDE (POUR BTL) OPTIME
TOPICAL | Status: DC | PRN
  Administered 2011-11-26: 1000 mL

## 2011-11-26 MED ORDER — FAMOTIDINE IN NACL 20-0.9 MG/50ML-% IV SOLN
20.0000 mg | Freq: Two times a day (BID) | INTRAVENOUS | Status: DC
  Administered 2011-11-26: 20 mg via INTRAVENOUS

## 2011-11-26 MED ORDER — ACETAMINOPHEN 160 MG/5ML PO SOLN
975.0000 mg | Freq: Four times a day (QID) | ORAL | Status: DC
  Filled 2011-11-26: qty 40.6

## 2011-11-26 MED ORDER — SODIUM CHLORIDE 0.9 % IV SOLN
0.1000 ug/kg/h | INTRAVENOUS | Status: DC
  Filled 2011-11-26 (×2): qty 2

## 2011-11-26 MED ORDER — PHENYLEPHRINE HCL 10 MG/ML IJ SOLN
0.0000 ug/min | INTRAMUSCULAR | Status: DC
  Administered 2011-11-27: 30 ug/min via INTRAVENOUS
  Filled 2011-11-26 (×2): qty 2

## 2011-11-26 MED ORDER — VANCOMYCIN HCL 1000 MG IV SOLR
1000.0000 mg | Freq: Once | INTRAVENOUS | Status: AC
  Administered 2011-11-26: 1000 mg via INTRAVENOUS
  Filled 2011-11-26 (×2): qty 1000

## 2011-11-26 MED ORDER — NITROGLYCERIN IN D5W 200-5 MCG/ML-% IV SOLN
INTRAVENOUS | Status: DC | PRN
  Administered 2011-11-26: 16.6 ug/min via INTRAVENOUS

## 2011-11-26 SURGICAL SUPPLY — 111 items
ADAPTER CARDIO PERF ANTE/RETRO (ADAPTER) ×1 IMPLANT
ADPR PRFSN 84XANTGRD RTRGD (ADAPTER) ×1
APL SKNCLS STERI-STRIP NONHPOA (GAUZE/BANDAGES/DRESSINGS) ×1
ATTRACTOMAT 16X20 MAGNETIC DRP (DRAPES) ×2 IMPLANT
BAG DECANTER FOR FLEXI CONT (MISCELLANEOUS) ×2 IMPLANT
BANDAGE ELASTIC 4 VELCRO ST LF (GAUZE/BANDAGES/DRESSINGS) ×2 IMPLANT
BANDAGE ELASTIC 6 VELCRO ST LF (GAUZE/BANDAGES/DRESSINGS) ×2 IMPLANT
BANDAGE GAUZE ELAST BULKY 4 IN (GAUZE/BANDAGES/DRESSINGS) ×2 IMPLANT
BASKET HEART (ORDER IN 25'S) (MISCELLANEOUS) ×1
BASKET HEART (ORDER IN 25S) (MISCELLANEOUS) ×1 IMPLANT
BENZOIN TINCTURE PRP APPL 2/3 (GAUZE/BANDAGES/DRESSINGS) ×1 IMPLANT
BLADE SAW STERNAL (BLADE) ×2 IMPLANT
BLADE SURG 11 STRL SS (BLADE) ×1 IMPLANT
BLADE SURG ROTATE 9660 (MISCELLANEOUS) IMPLANT
CANISTER SUCTION 2500CC (MISCELLANEOUS) ×2 IMPLANT
CANNULA GUNDRY RCSP 15FR (MISCELLANEOUS) ×1 IMPLANT
CATH ROBINSON RED A/P 18FR (CATHETERS) ×5 IMPLANT
CATH THORACIC 28FR (CATHETERS) ×2 IMPLANT
CATH THORACIC 28FR RT ANG (CATHETERS) IMPLANT
CATH THORACIC 36FR (CATHETERS) ×2 IMPLANT
CATH THORACIC 36FR RT ANG (CATHETERS) ×2 IMPLANT
CLIP FOGARTY SPRING 6M (CLIP) IMPLANT
CLIP TI MEDIUM 24 (CLIP) IMPLANT
CLIP TI WIDE RED SMALL 24 (CLIP) ×1 IMPLANT
CLOTH BEACON ORANGE TIMEOUT ST (SAFETY) ×2 IMPLANT
COVER SURGICAL LIGHT HANDLE (MISCELLANEOUS) ×4 IMPLANT
CRADLE DONUT ADULT HEAD (MISCELLANEOUS) ×2 IMPLANT
DRAPE CARDIOVASCULAR INCISE (DRAPES) ×2
DRAPE SLUSH MACHINE 52X66 (DRAPES) IMPLANT
DRAPE SLUSH/WARMER DISC (DRAPES) IMPLANT
DRAPE SRG 135X102X78XABS (DRAPES) ×1 IMPLANT
DRSG COVADERM 4X14 (GAUZE/BANDAGES/DRESSINGS) ×2 IMPLANT
ELECT CAUTERY BLADE 6.4 (BLADE) ×2 IMPLANT
ELECT REM PT RETURN 9FT ADLT (ELECTROSURGICAL) ×4
ELECTRODE REM PT RTRN 9FT ADLT (ELECTROSURGICAL) ×2 IMPLANT
GLOVE BIO SURGEON STRL SZ 6 (GLOVE) ×4 IMPLANT
GLOVE BIO SURGEON STRL SZ 6.5 (GLOVE) ×3 IMPLANT
GLOVE BIO SURGEON STRL SZ7 (GLOVE) IMPLANT
GLOVE BIO SURGEON STRL SZ7.5 (GLOVE) IMPLANT
GLOVE BIOGEL PI IND STRL 6 (GLOVE) IMPLANT
GLOVE BIOGEL PI IND STRL 6.5 (GLOVE) IMPLANT
GLOVE BIOGEL PI IND STRL 7.0 (GLOVE) IMPLANT
GLOVE BIOGEL PI INDICATOR 6 (GLOVE) ×2
GLOVE BIOGEL PI INDICATOR 6.5 (GLOVE)
GLOVE BIOGEL PI INDICATOR 7.0 (GLOVE)
GLOVE EUDERMIC 7 POWDERFREE (GLOVE) ×4 IMPLANT
GLOVE ORTHO TXT STRL SZ7.5 (GLOVE) IMPLANT
GOWN PREVENTION PLUS XLARGE (GOWN DISPOSABLE) ×4 IMPLANT
GOWN STRL NON-REIN LRG LVL3 (GOWN DISPOSABLE) ×8 IMPLANT
HEMOSTAT POWDER SURGIFOAM 1G (HEMOSTASIS) ×5 IMPLANT
HEMOSTAT SURGICEL 2X14 (HEMOSTASIS) ×2 IMPLANT
INSERT FOGARTY 61MM (MISCELLANEOUS) IMPLANT
INSERT FOGARTY XLG (MISCELLANEOUS) IMPLANT
KIT BASIN OR (CUSTOM PROCEDURE TRAY) ×2 IMPLANT
KIT CATH CPB BARTLE (MISCELLANEOUS) ×2 IMPLANT
KIT ROOM TURNOVER OR (KITS) ×2 IMPLANT
KIT SUCTION CATH 14FR (SUCTIONS) ×2 IMPLANT
KIT VASOVIEW W/TROCAR VH 2000 (KITS) ×2 IMPLANT
NS IRRIG 1000ML POUR BTL (IV SOLUTION) ×11 IMPLANT
PACK OPEN HEART (CUSTOM PROCEDURE TRAY) ×2 IMPLANT
PAD ARMBOARD 7.5X6 YLW CONV (MISCELLANEOUS) ×4 IMPLANT
PENCIL BUTTON HOLSTER BLD 10FT (ELECTRODE) ×2 IMPLANT
PUNCH AORTIC ROTATE 4.0MM (MISCELLANEOUS) IMPLANT
PUNCH AORTIC ROTATE 4.5MM 8IN (MISCELLANEOUS) ×2 IMPLANT
PUNCH AORTIC ROTATE 5MM 8IN (MISCELLANEOUS) IMPLANT
SET CARDIOPLEGIA MPS 5001102 (MISCELLANEOUS) ×1 IMPLANT
SOLUTION ANTI FOG 6CC (MISCELLANEOUS) IMPLANT
SPONGE GAUZE 4X4 12PLY (GAUZE/BANDAGES/DRESSINGS) ×4 IMPLANT
SPONGE INTESTINAL PEANUT (DISPOSABLE) IMPLANT
SPONGE LAP 18X18 X RAY DECT (DISPOSABLE) IMPLANT
SPONGE LAP 4X18 X RAY DECT (DISPOSABLE) ×2 IMPLANT
STRIP CLOSURE SKIN 1/4X4 (GAUZE/BANDAGES/DRESSINGS) ×1 IMPLANT
SUT BONE WAX W31G (SUTURE) ×2 IMPLANT
SUT MNCRL AB 4-0 PS2 18 (SUTURE) ×1 IMPLANT
SUT PROLENE 3 0 SH DA (SUTURE) IMPLANT
SUT PROLENE 3 0 SH1 36 (SUTURE) IMPLANT
SUT PROLENE 4 0 RB 1 (SUTURE) ×2
SUT PROLENE 4 0 SH DA (SUTURE) IMPLANT
SUT PROLENE 4-0 RB1 .5 CRCL 36 (SUTURE) IMPLANT
SUT PROLENE 5 0 C 1 36 (SUTURE) IMPLANT
SUT PROLENE 6 0 C 1 30 (SUTURE) ×3 IMPLANT
SUT PROLENE 6 0 CC (SUTURE) IMPLANT
SUT PROLENE 7 0 BV 1 (SUTURE) IMPLANT
SUT PROLENE 7 0 BV1 MDA (SUTURE) ×3 IMPLANT
SUT PROLENE 7.0 RB 3 (SUTURE) ×2 IMPLANT
SUT PROLENE 8 0 BV175 6 (SUTURE) IMPLANT
SUT SILK  1 MH (SUTURE)
SUT SILK 1 MH (SUTURE) IMPLANT
SUT SILK 2 0 SH CR/8 (SUTURE) ×1 IMPLANT
SUT SILK 3 0 SH CR/8 (SUTURE) IMPLANT
SUT STEEL STERNAL CCS#1 18IN (SUTURE) IMPLANT
SUT STEEL SZ 6 DBL 3X14 BALL (SUTURE) ×3 IMPLANT
SUT VIC AB 1 CTX 36 (SUTURE) ×4
SUT VIC AB 1 CTX36XBRD ANBCTR (SUTURE) ×2 IMPLANT
SUT VIC AB 2-0 CT1 27 (SUTURE) ×2
SUT VIC AB 2-0 CT1 TAPERPNT 27 (SUTURE) IMPLANT
SUT VIC AB 2-0 CTX 27 (SUTURE) IMPLANT
SUT VIC AB 3-0 SH 27 (SUTURE)
SUT VIC AB 3-0 SH 27X BRD (SUTURE) IMPLANT
SUT VIC AB 3-0 X1 27 (SUTURE) IMPLANT
SUT VICRYL 4-0 PS2 18IN ABS (SUTURE) IMPLANT
SUTURE E-PAK OPEN HEART (SUTURE) ×2 IMPLANT
SYSTEM SAHARA CHEST DRAIN ATS (WOUND CARE) ×2 IMPLANT
TAPE CLOTH SURG 4X10 WHT LF (GAUZE/BANDAGES/DRESSINGS) ×2 IMPLANT
TOWEL OR 17X24 6PK STRL BLUE (TOWEL DISPOSABLE) ×2 IMPLANT
TOWEL OR 17X26 10 PK STRL BLUE (TOWEL DISPOSABLE) ×2 IMPLANT
TRAY FOLEY IC TEMP SENS 14FR (CATHETERS) ×2 IMPLANT
TUBE SUCT INTRACARD DLP 20F (MISCELLANEOUS) ×2 IMPLANT
TUBING INSUFFLATION 10FT LAP (TUBING) ×2 IMPLANT
UNDERPAD 30X30 INCONTINENT (UNDERPADS AND DIAPERS) ×2 IMPLANT
WATER STERILE IRR 1000ML POUR (IV SOLUTION) ×4 IMPLANT

## 2011-11-26 NOTE — Progress Notes (Signed)
UR Completed.  Kinan Safley Jane [phone] 11/26/2011  

## 2011-11-26 NOTE — Procedures (Signed)
Extubation Procedure Note  Patient Details:   Name: Ryan Pacheco DOB: 1920-06-21 MRN: [id number]   Airway Documentation:  Airway 8.5 mm (Active)  Secured at (cm) 24 cm 11/26/2011  4:45 PM  Measured From Lips 11/26/2011  4:45 PM  Secured Location Right 11/26/2011  4:45 PM  Secured By Caron Presume Tape 11/26/2011  4:45 PM    Evaluation  O2 sats: stable throughout Complications: No apparent complications Patient did tolerate procedure well. Bilateral Breath Sounds: Diminished;Clear   Yes  Ok Anis, MA 11/26/2011, 5:29 PM

## 2011-11-26 NOTE — Brief Op Note (Signed)
11/18/2011 - 11/26/2011  10:36 AM  PATIENT:  Ryan Pacheco  78 y.o. male  PRE-OPERATIVE DIAGNOSIS:  Atherosclerotic coronary occlusive disease  POST-OPERATIVE DIAGNOSIS:  Atherosclerotic coronary occlusive disease  PROCEDURE:  Procedure(s): CORONARY ARTERY BYPASS GRAFTING (CABG) x 4 (LIMA-LAD, SVG-D, SVG-OM1, SVG-OM2), EVH right leg  SURGEON:  Surgeon(s): Alleen Borne, MD  PHYSICIAN ASSISTANT: Coral Ceo, PA-C  ANESTHESIA:   general  PATIENT CONDITION:  ICU - intubated and hemodynamically stable.  PRE-OPERATIVE WEIGHT: 82 kg

## 2011-11-26 NOTE — Transfer of Care (Signed)
Immediate Anesthesia Transfer of Care Note  Patient: Ryan Pacheco  Procedure(s) Performed:  CORONARY ARTERY BYPASS GRAFTING (CABG)  Patient Location: SICU  Anesthesia Type: General  Level of Consciousness: Patient remains intubated per anesthesia plan  Airway & Oxygen Therapy: Patient remains intubated per anesthesia plan and Patient placed on Ventilator (see vital sign flow sheet for setting)  Post-op Assessment: Post -op Vital signs reviewed and stable  Post vital signs: Reviewed and stable  Complications: No apparent anesthesia complications

## 2011-11-26 NOTE — Progress Notes (Signed)
Patient ID: Ryan Pacheco, male   DOB: [birth year], 78 y.o.   MRN: [id number]  Filed Vitals:   11/26/11 1815 11/26/11 1830 11/26/11 1845 11/26/11 1900  BP:      Pulse: 91 91 91 91  Temp: 99.3 F (37.4 C) 99.3 F (37.4 C) 99.3 F (37.4 C) 99.3 F (37.4 C)  TempSrc:      Resp: 14 13 23 16   Height:      Weight:      SpO2: 99% 99% 99% 99%   Extubated. Urine output good CT output low  CBC    Component Value Date/Time   WBC 13.8* 11/26/2011 1810   RBC 3.65* 11/26/2011 1810   HGB 11.2* 11/26/2011 1811   HCT 33.0* 11/26/2011 1811   PLT 154 11/26/2011 1810   MCV 91.5 11/26/2011 1810   MCH 30.7 11/26/2011 1810   MCHC 33.5 11/26/2011 1810   RDW 14.0 11/26/2011 1810   LYMPHSABS 1.5 11/18/2011 1117   MONOABS 0.6 11/18/2011 1117   EOSABS 0.1 11/18/2011 1117   BASOSABS 0.0 11/18/2011 1117    BMET    Component Value Date/Time   NA 142 11/26/2011 1811   K 5.0 11/26/2011 1811   CL 106 11/26/2011 1811   CO2 26 11/26/2011 1810   GLUCOSE 132* 11/26/2011 1811   BUN 13 11/26/2011 1811   CREATININE 0.80 11/26/2011 1811   CALCIUM 8.0* 11/26/2011 1810   GFRNONAA 78* 11/26/2011 1810   GFRAA 90* 11/26/2011 1810    Stable postop course

## 2011-11-26 NOTE — OR Nursing (Signed)
Time Out performed at 0829. Leg incision at 0830; chest incision at 0835.

## 2011-11-26 NOTE — Anesthesia Preprocedure Evaluation (Addendum)
Anesthesia Evaluation  Patient identified by MRN, date of birth, ID band Patient awake    Reviewed: Allergy & Precautions, H&P , NPO status , Patient's Chart, lab work & pertinent test results  History of Anesthesia Complications Negative for: history of anesthetic complications  Airway Mallampati: II TM Distance: >3 FB     Dental  (+) Poor Dentition and Missing   Pulmonary          Cardiovascular + angina with exertion + Past MI (NSTEMI 11/18/11) Regular Normal 11/20/11 cardiac cath = severe multi vessel CAD, normal LV function, EF 50%   Neuro/Psych    GI/Hepatic GERD-  Medicated and Controlled,  Endo/Other    Renal/GU BPH     Musculoskeletal   Abdominal   Peds  Hematology   Anesthesia Other Findings   Reproductive/Obstetrics                           Anesthesia Physical Anesthesia Plan  ASA: III  Anesthesia Plan: General   Post-op Pain Management:    Induction: Intravenous  Airway Management Planned: Oral ETT  Additional Equipment: Arterial line, CVP and PA Cath  Intra-op Plan:   Post-operative Plan: Post-operative intubation/ventilation  Informed Consent: I have reviewed the patients History and Physical, chart, labs and discussed the procedure including the risks, benefits and alternatives for the proposed anesthesia with the patient or authorized representative who has indicated his/her understanding and acceptance.   Dental advisory given  Plan Discussed with: CRNA, Anesthesiologist and Surgeon  Anesthesia Plan Comments:         Anesthesia Quick Evaluation

## 2011-11-26 NOTE — Anesthesia Postprocedure Evaluation (Signed)
  Anesthesia Post-op Note  Patient: Ryan Pacheco  Procedure(s) Performed:  CORONARY ARTERY BYPASS GRAFTING (CABG)  Patient Location: PACU and SICU  Anesthesia Type: General  Level of Consciousness: sedated  Airway and Oxygen Therapy: Patient remains intubated per anesthesia plan  Post-op Pain: mild  Post-op Assessment: Post-op Vital signs reviewed  Post-op Vital Signs: stable  Complications: No apparent anesthesia complications

## 2011-11-27 ENCOUNTER — Inpatient Hospital Stay (HOSPITAL_COMMUNITY): Payer: Medicare Other

## 2011-11-27 ENCOUNTER — Encounter (HOSPITAL_COMMUNITY): Payer: Self-pay | Admitting: Surgery

## 2011-11-27 LAB — POCT I-STAT 4, (NA,K, GLUC, HGB,HCT)
Glucose, Bld: 102 mg/dL — ABNORMAL HIGH (ref 70–99)
Glucose, Bld: 100 mg/dL — ABNORMAL HIGH (ref 70–99)
Glucose, Bld: 115 mg/dL — ABNORMAL HIGH (ref 70–99)
HCT: 30 % — ABNORMAL LOW (ref 39.0–52.0)
HCT: 25 % — ABNORMAL LOW (ref 39.0–52.0)
Hemoglobin: 10.9 g/dL — ABNORMAL LOW (ref 13.0–17.0)
Hemoglobin: 8.5 g/dL — ABNORMAL LOW (ref 13.0–17.0)
Potassium: 4.5 mEq/L (ref 3.5–5.1)
Potassium: 4.2 mEq/L (ref 3.5–5.1)
Potassium: 5 mEq/L (ref 3.5–5.1)
Sodium: 142 mEq/L (ref 135–145)
Sodium: 137 mEq/L (ref 135–145)

## 2011-11-27 LAB — CBC
HCT: 30.8 % — ABNORMAL LOW (ref 39.0–52.0)
HCT: 28.7 % — ABNORMAL LOW (ref 39.0–52.0)
Hemoglobin: 10.3 g/dL — ABNORMAL LOW (ref 13.0–17.0)
Hemoglobin: 9.8 g/dL — ABNORMAL LOW (ref 13.0–17.0)
MCH: 30.8 pg (ref 26.0–34.0)
MCHC: 33.4 g/dL (ref 30.0–36.0)
MCV: 92.2 fL (ref 78.0–100.0)
MCV: 91.1 fL (ref 78.0–100.0)
RBC: 3.34 MIL/uL — ABNORMAL LOW (ref 4.22–5.81)
RBC: 3.15 MIL/uL — ABNORMAL LOW (ref 4.22–5.81)
RDW: 14.1 % (ref 11.5–15.5)
WBC: 14.2 10*3/uL — ABNORMAL HIGH (ref 4.0–10.5)

## 2011-11-27 LAB — POCT I-STAT 3, ART BLOOD GAS (G3+)
Bicarbonate: 26.9 mEq/L — ABNORMAL HIGH (ref 20.0–24.0)
TCO2: 28 mmol/L (ref 0–100)
pH, Arterial: 7.394 (ref 7.350–7.450)
pO2, Arterial: 325 mmHg — ABNORMAL HIGH (ref 80.0–100.0)

## 2011-11-27 LAB — GLUCOSE, CAPILLARY
Glucose-Capillary: 123 mg/dL — ABNORMAL HIGH (ref 70–99)
Glucose-Capillary: 139 mg/dL — ABNORMAL HIGH (ref 70–99)
Glucose-Capillary: 153 mg/dL — ABNORMAL HIGH (ref 70–99)
Glucose-Capillary: 153 mg/dL — ABNORMAL HIGH (ref 70–99)

## 2011-11-27 LAB — BASIC METABOLIC PANEL
BUN: 15 mg/dL (ref 6–23)
CO2: 27 mEq/L (ref 19–32)
Calcium: 8.1 mg/dL — ABNORMAL LOW (ref 8.4–10.5)
GFR calc non Af Amer: 73 mL/min — ABNORMAL LOW (ref >90)
Glucose, Bld: 140 mg/dL — ABNORMAL HIGH (ref 70–99)

## 2011-11-27 LAB — MAGNESIUM: Magnesium: 2.5 mg/dL (ref 1.5–2.5)

## 2011-11-27 LAB — POCT I-STAT, CHEM 8
Chloride: 100 mEq/L (ref 96–112)
Creatinine, Ser: 0.9 mg/dL (ref 0.50–1.35)
Glucose, Bld: 141 mg/dL — ABNORMAL HIGH (ref 70–99)
Hemoglobin: 9.9 g/dL — ABNORMAL LOW (ref 13.0–17.0)
Potassium: 3.9 mEq/L (ref 3.5–5.1)
Sodium: 138 mEq/L (ref 135–145)

## 2011-11-27 LAB — CREATININE, SERUM
GFR calc Af Amer: 82 mL/min — ABNORMAL LOW (ref >90)
GFR calc non Af Amer: 71 mL/min — ABNORMAL LOW (ref >90)

## 2011-11-27 MED ORDER — FUROSEMIDE 10 MG/ML IJ SOLN
40.0000 mg | Freq: Once | INTRAMUSCULAR | Status: AC
  Administered 2011-11-27: 40 mg via INTRAVENOUS
  Filled 2011-11-27: qty 4

## 2011-11-27 MED ORDER — INSULIN ASPART 100 UNIT/ML ~~LOC~~ SOLN
0.0000 [IU] | SUBCUTANEOUS | Status: DC
  Administered 2011-11-27: 4 [IU] via SUBCUTANEOUS
  Administered 2011-11-27 – 2011-11-28 (×3): 2 [IU] via SUBCUTANEOUS
  Administered 2011-11-28: 4 [IU] via SUBCUTANEOUS
  Administered 2011-11-28: 2 [IU] via SUBCUTANEOUS
  Administered 2011-11-28: 4 [IU] via SUBCUTANEOUS
  Administered 2011-11-29 (×2): 2 [IU] via SUBCUTANEOUS

## 2011-11-27 NOTE — Progress Notes (Signed)
Patient discussed at the Long Length of Stay Daley Gosse Weeks 11/27/2011  

## 2011-11-27 NOTE — Progress Notes (Signed)
[facility] BRIEF SICU PROGRESS NOTE  1 Day Post-Op  S/P Procedure(s) (LRB): CORONARY ARTERY BYPASS GRAFTING (CABG) (N/A)   Stable day Paced rhythm BP stable  UOP adequate  Plan: Continue current plan  Robet Crutchfield H 11/27/2011 9:29 PM

## 2011-11-27 NOTE — Progress Notes (Signed)
1 Day Post-Op Procedure(s) (LRB): CORONARY ARTERY BYPASS GRAFTING (CABG) (N/A) Subjective: No complaints  Objective: Vital signs in last 24 hours: Temp:  [94.8 F (34.9 C)-99.3 F (37.4 C)] 98.4 F (36.9 C) (01/30 0700) Pulse Rate:  [41-91] 91  (01/30 0700) Cardiac Rhythm:  [-] A-V Sequential paced (01/30 0700) Resp:  [11-35] 15  (01/30 0700) BP: (89-118)/(50-63) 95/54 mmHg (01/30 0700) SpO2:  [85 %-100 %] 95 % (01/30 0700) Arterial Line BP: (81-140)/(44-82) 97/44 mmHg (01/30 0700) FiO2 (%):  [40 %-50 %] 40 % (01/29 1645) Weight:  [85.684 kg (188 lb 14.4 oz)] 85.684 kg (188 lb 14.4 oz) (01/30 0415)  Hemodynamic parameters for last 24 hours: PAP: (24-45)/(4-24) 32/9 mmHg CO:  [3 L/min-5.3 L/min] 5.3 L/min CI:  [1.6 L/min/m2-2.9 L/min/m2] 2.9 L/min/m2  Intake/Output from previous day: 01/29 0701 - 01/30 0700 In: 5857.2 [P.O.:350; I.V.:4197.2; Blood:470; IV Piggyback:840] Out: 7000 [Urine:4885; Blood:1555; Chest Tube:560] Intake/Output this shift:    General appearance: alert and cooperative Neurologic: intact Heart: regular rate and rhythm, S1, S2 normal, no murmur, click, rub or gallop Lungs: clear to auscultation bilaterally Extremities: edema mild Wound: dressing dry  Lab Results:  Basename 11/27/11 0412 11/26/11 1811 11/26/11 1810  WBC 14.4* -- 13.8*  HGB 10.3* 11.2* --  HCT 30.8* 33.0* --  PLT 162 -- 154   BMET:  Basename 11/27/11 0412 11/26/11 1811 11/26/11 1810  NA 140 142 --  K 4.3 5.0 --  CL 107 106 --  CO2 27 -- 26  GLUCOSE 140* 132* --  BUN 15 13 --  CREATININE 0.89 0.80 --  CALCIUM 8.1* -- 8.0*    PT/INR:  Basename 11/26/11 1254  LABPROT 19.0*  INR 1.56*   ABG    Component Value Date/Time   PHART 7.353 11/26/2011 1814   HCO3 26.5* 11/26/2011 1814   TCO2 28 11/26/2011 1814   ACIDBASEDEF 2.0 11/26/2011 1706   O2SAT 97.0 11/26/2011 1814   CBG (last 3)   Basename 11/27/11 0404 11/26/11 2359 11/26/11 2205  GLUCAP 142* 139* 117*   CXR:   Chronic lung changes but otherwise stable  ECG: sinus brady 51, nonspecific T-wave changes.  Assessment/Plan: S/P Procedure(s) (LRB): CORONARY ARTERY BYPASS GRAFTING (CABG) (N/A) Mobilize Diuresis d/c tubes/lines See progression orders   LOS: 9 days    Stephanne Greeley K 11/27/2011

## 2011-11-27 NOTE — Op Note (Signed)
NAME:  Ryan Pacheco, Ryan Pacheco NO.:  MEDICAL RECORD NO.:  [id number]  LOCATION:                                 FACILITY:  PHYSICIAN:  Evelene Croon, M.D.     DATE OF BIRTH:  1920/09/24  DATE OF PROCEDURE:  11/26/2011 DATE OF DISCHARGE:                              OPERATIVE REPORT   PREOPERATIVE DIAGNOSIS:  Severe two-vessel coronary artery disease, status post non-ST-segment elevation myocardial infarction.  POSTOPERATIVE DIAGNOSIS:  Severe two-vessel coronary artery disease, status post non-ST-segment elevation myocardial infarction.  OPERATIVE PROCEDURE:  Median sternotomy, extracorporeal circulation, coronary artery bypass graft surgery x4 using a left internal mammary artery graft to left anterior descending coronary artery, with a saphenous vein graft to the diagonal branch of the LAD, a saphenous vein graft to first obtuse marginal, and a saphenous vein graft to the second obtuse marginal.  Endoscopic vein harvesting from the right leg.  ATTENDING SURGEON:  Evelene Croon, M.D.  ASSISTANT:  Coral Ceo, PA-C.  ANESTHESIA:  General endotracheal.  CLINICAL HISTORY:  This patient is a 78 year old gentleman with no prior cardiac history, who was admitted with about a 1-week history of coughing and some upper chest discomfort, which he felt was due to a cold.  His cold resolved, but chest pain persisted.  His initial cardiac enzymes, when he came to the emergency room, were elevated with a troponin of 0.17.  Electrocardiogram was nondiagnostic for MI.  Due to his age and desire to be treated medically, he was continued with medical therapy, but continued to have off and on chest pain while in the hospital.  He subsequently underwent cardiac catheterization, which showed severe 2-vessel disease.  There was a large diagonal branch that had high-grade ostial stenosis.  Just beyond this, the LAD was occluded and really did not fill up significantly with any  collateral flow on catheterization.  The left circumflex gave off 2 marginal branches and there was a high-grade stenosis at the bifurcation of these 2 marginal branches.  The right coronary artery was a large dominant vessel and had no significant disease in it.  Left ventricular function was well preserved and the anterior wall moved well despite having an occluded LAD, so I thought it was probably a descent vessel.  After review of the catheterization and examination of the patient, it was felt that he was a good candidate for coronary artery bypass surgery despite his age.  He obviously has failed medical therapy while in the hospital and therefore a surgical treatment is really only good option for him.  He also had some distal left main stenosis.  I discussed the operative procedure with the patient and his daughter including alternatives, benefits, and risks including but not limited to bleeding, blood transfusion, infection, stroke, myocardial infarction, graft failure, and death.  He understood all of this and agreed to proceed.  OPERATIVE PROCEDURE:  The patient was taken to the operative room and placed on the table in supine position.  After induction of general endotracheal anesthesia, a Foley catheter was placed in bladder using sterile technique.  Then, the chest, abdomen, and both lower extremities were prepped  and draped in usual sterile manner.  The chest was entered through a median sternotomy incision and the pericardium opened in midline.  Exam of the heart showed good ventricular contractility.  The ascending aorta was of normal size and had no palpable plaques in it.  Then the left internal mammary artery was harvested from chest wall as a pedicle graft.  This was a medium-caliber vessel with excellent blood flow through it.  At the same time, a segment of greater saphenous vein was harvested from the right leg using endoscopic vein harvest technique.  This vein  was of medium size and good quality.  Then, the patient was heparinized when adequate ACT was obtained, the distal ascending aorta was cannulated using a 20-French aortic cannula for arterial inflow.  Venous outflow was achieved using a two-stage venous cannula for the right atrial appendage.  An antegrade cardioplegia and vent cannula was inserted in aortic root.  Given the LAD occlusion, which was not collateralized, tried to insert a retrograde cardioplegic cannula but could not get it to engage the coronary sinus ostium and after trying to insert this for a while, I felt probably best just to go with antegrade cardioplegia.  Then, the patient was placed on cardiopulmonary bypass and the distal coronary arteries identified.  The LAD was a moderate-size graftable vessel that extended down to the apex.  It had no significant plaque in the mid or distal portion.  The diagonal branch was a large graftable vessel.  The 2 marginal branches were both moderate size graftable vessel with no significant distal disease in them.  Then the aorta was crossclamped and 1000 mL of cold blood antegrade cardioplegia was administered in the aortic root with quick arrest of the heart.  Systemic hypothermia to 32 degrees centigrade and topical hypothermic iced saline was used.  A temperature probe was placed in septum and an insulating pad in the pericardium.  The first distal anastomosis was performed to the first marginal branch. The internal diameter of this vessel was about 1.6 mm.  The conduit used was a segment of greater saphenous vein and the anastomosis was performed in an end-to-side manner using continuous 7-0 suture.  Flow was noted through the graft and was excellent.  The second distal anastomosis was performed to the second marginal branch.  The internal diameter was also about 1.6 mm.  The conduit used was the second segment of greater saphenous vein and the anastomosis was performed  in an end-to-side manner using continuous 7-0 Prolene suture. Flow was noted through the graft and was excellent.  Then, third distal anastomosis was performed to the diagonal branch. The internal diameter of this vessel was about 1.75 mm.  The conduit used was a third segment of greater saphenous vein and anastomosis was performed in an end-to-side manner using continuous 7-0 Prolene suture. Flow was noted through the graft and was excellent.  Then another dose of cardioplegia was given down vein grafts and aortic root.  The fourth distal anastomosis was performed to the mid-portion of the LAD.  The internal diameter was 1.75 mm.  The conduit used was a left internal mammary graft and this was brought through an opening of left pericardium anterior to the phrenic nerve.  It was anastomosed to the LAD in an end-to-side manner using continuous 8-0 Prolene suture.  The pedicle was sutured to the epicardium with 6-0 Prolene sutures.  The patient rewarmed to 37 degrees centigrade.  Another dose of cardioplegia was given and with  the crossclamp in place, the 3 proximal vein graft anastomoses were performed to the mid ascending aorta in an end-to-side manner using continuous 6-0 Prolene suture.  Then, the clamp was removed from mammary pedicle.  There was rapid warming of ventricular septum and return of spontaneous ventricular fibrillation.  Crossclamp was removed time of 74 minutes and the patient spontaneously converted to sinus bradycardia rhythm.  The proximal and distal anastomoses appeared hemostatic and allowed the grafts satisfactory.  Graft marker was placed around the proximal anastomoses.  Two temporary right ventricular and right atrial pacing wires were placed and brought out through the skin.  When the patient rewarmed to 37 degrees centigrade, he was weaned from cardiopulmonary bypass on no inotropic agents.  Total bypass time was 100 minutes.  Cardiac function appeared  excellent with cardiac output of 6 L/minute.  Protamine was given, and venous and aortic cannulas were removed without difficulty.  Hemostasis was achieved.  Three chest tubes were placed with 2 in the post pericardium, 1 on the anterior mediastinum, and 1 in the left pleural space.  The sternum was then reapproximated with double #6 stainless steel wires.  The fascia was closed with continuous 1 Vicryl suture.  Subcutaneous tissue was closed with continuous 2-0 Vicryl and skin with a 3-0 Vicryl subcuticular closure.  The lower extremity vein harvest site was closed in layers in similar manner.  The sponge, needle, and instrument counts were correct according to the scrub nurse.  Dry sterile dressings applied over the incisions and around the chest tubes,which were hooked to Pleur-Evac suction.  The patient remained hemodynamically stable and transported to the SICU in guarded, but in stable condition.     Evelene Croon, M.D.     BB/MEDQ  D:  11/26/2011  T:  11/26/2011  Job:  [id number]

## 2011-11-27 NOTE — Progress Notes (Signed)
Chaplain made initial visit to patient upon referral from unit secretary. Chaplain provided a non-anxious presence, listened, and offered emotional support. Patient stated that he is coping okay. Follow up as needed.

## 2011-11-28 ENCOUNTER — Inpatient Hospital Stay (HOSPITAL_COMMUNITY): Payer: Medicare Other

## 2011-11-28 LAB — GLUCOSE, CAPILLARY
Glucose-Capillary: 110 mg/dL — ABNORMAL HIGH (ref 70–99)
Glucose-Capillary: 137 mg/dL — ABNORMAL HIGH (ref 70–99)
Glucose-Capillary: 175 mg/dL — ABNORMAL HIGH (ref 70–99)

## 2011-11-28 LAB — BASIC METABOLIC PANEL
Chloride: 100 mEq/L (ref 96–112)
GFR calc Af Amer: 84 mL/min — ABNORMAL LOW (ref >90)
GFR calc non Af Amer: 73 mL/min — ABNORMAL LOW (ref >90)
Potassium: 3.8 mEq/L (ref 3.5–5.1)
Sodium: 135 mEq/L (ref 135–145)

## 2011-11-28 LAB — CBC
HCT: 28.3 % — ABNORMAL LOW (ref 39.0–52.0)
Hemoglobin: 9.5 g/dL — ABNORMAL LOW (ref 13.0–17.0)
RBC: 3.12 MIL/uL — ABNORMAL LOW (ref 4.22–5.81)
WBC: 15.6 10*3/uL — ABNORMAL HIGH (ref 4.0–10.5)

## 2011-11-28 MED ORDER — INSULIN GLARGINE 100 UNIT/ML ~~LOC~~ SOLN
14.0000 [IU] | Freq: Every day | SUBCUTANEOUS | Status: DC
  Administered 2011-11-28 – 2011-11-29 (×2): 14 [IU] via SUBCUTANEOUS
  Filled 2011-11-28: qty 3

## 2011-11-28 MED FILL — Magnesium Sulfate Inj 50%: INTRAMUSCULAR | Qty: 10 | Status: AC

## 2011-11-28 MED FILL — Nitroglycerin IV Soln 5 MG/ML: INTRAVENOUS | Qty: 10 | Status: AC

## 2011-11-28 MED FILL — Heparin Sodium (Porcine) Inj 1000 Unit/ML: INTRAMUSCULAR | Qty: 10 | Status: AC

## 2011-11-28 MED FILL — Potassium Chloride Inj 2 mEq/ML: INTRAVENOUS | Qty: 40 | Status: AC

## 2011-11-28 MED FILL — Lactated Ringer's Solution: INTRAVENOUS | Qty: 500 | Status: AC

## 2011-11-28 MED FILL — Verapamil HCl IV Soln 2.5 MG/ML: INTRAVENOUS | Qty: 4 | Status: AC

## 2011-11-28 MED FILL — Dexmedetomidine HCl IV Soln 200 MCG/2ML: INTRAVENOUS | Qty: 2 | Status: AC

## 2011-11-28 NOTE — Progress Notes (Signed)
[address]            [place] [address]          [phone]     2 Days Post-Op Procedure(s) (LRB): CORONARY ARTERY BYPASS GRAFTING (CABG) (N/A)  Subjective: OOB in chair.  Eating well, breathing stable.  No complaints.  Objective: Vital signs in last 24 hours: Patient Vitals for the past 24 hrs:  BP Temp Temp src Pulse Resp SpO2 Weight  11/28/11 0900 124/61 mmHg - - 43  22  75 % -  11/28/11 0800 117/72 mmHg - - 82  23  92 % -  11/28/11 0700 106/55 mmHg - - 79  18  97 % -  11/28/11 0600 114/56 mmHg - - 79  19  97 % -  11/28/11 0500 106/54 mmHg - - 79  22  97 % 86.7 kg (191 lb 2.2 oz)  11/28/11 0400 115/55 mmHg - - 79  35  97 % -  11/28/11 0359 - 98.4 F (36.9 C) Oral - - - -  11/28/11 0300 113/54 mmHg - - 79  41  98 % -  11/28/11 0200 102/52 mmHg - - 80  16  97 % -  11/28/11 0100 125/55 mmHg - - 83  22  97 % -  11/28/11 0000 102/46 mmHg - - 79  21  96 % -  11/27/11 2349 - 97.9 F (36.6 C) Oral - - - -  11/27/11 2300 133/77 mmHg - - - 29  - -  11/27/11 2200 123/53 mmHg - - 80  28  100 % -  11/27/11 2100 109/52 mmHg - - 80  27  97 % -  11/27/11 2000 120/55 mmHg - - 81  37  90 % -  11/27/11 1934 - 97.6 F (36.4 C) Oral - - - -  11/27/11 1900 107/58 mmHg - - 76  20  98 % -  11/27/11 1800 116/49 mmHg - - 80  17  99 % -  11/27/11 1700 104/51 mmHg - - 79  30  96 % -  11/27/11 1600 108/51 mmHg - - 79  44  98 % -  11/27/11 1549 - 97.8 F (36.6 C) Oral - - - -  11/27/11 1500 102/45 mmHg - - 79  25  97 % -  11/27/11 1400 128/59 mmHg - - 79  19  99 % -  11/27/11 1300 104/62 mmHg - - 80  21  97 % -  11/27/11 1200 116/58 mmHg - - 79  20  97 % -  11/27/11 1133 - 97.9 F (36.6 C) Oral - - - -  11/27/11 1100 112/66 mmHg - - 80  14  98 % -  11/27/11 1000 - - - 79  29  97 % -   Current Weight  11/28/11 86.7 kg (191 lb 2.2 oz)   Pre-op wt= 82 kg  Intake/Output from previous day: 01/30 0701 - 01/31 0700 In: 1281.9 [P.O.:720; I.V.:461.9; IV  Piggyback:100] Out: 1575 [Urine:1505; Chest Tube:70]  CBGs 141-162-108-127-128-110  PHYSICAL EXAM:  Heart: RRR, paced at 80 Lungs: clear, slightly decreased in bases Wound: dressed and dry Extremities: mild LE edema  Lab Results: CBC: Basename 11/28/11 0445 11/27/11 1721 11/27/11 1700  WBC 15.6* -- 14.2*  HGB 9.5* 9.9* --  HCT 28.3* 29.0* --  PLT 137* -- 135*  BMET:  Basename 11/28/11 0445 11/27/11 1721 11/27/11 0412  NA 135 138 --  K 3.8 3.9 --  CL 100 100 --  CO2 28 -- 27  GLUCOSE 128* 141* --  BUN 20 19 --  CREATININE 0.90 0.90 --  CALCIUM 8.5 -- 8.1*    PT/INR:  Basename 11/26/11 1254  LABPROT 19.0*  INR 1.56*   CXR: stable effusions/edema  Assessment/Plan: S/P Procedure(s) (LRB): CORONARY ARTERY BYPASS GRAFTING (CABG) (N/A) CV- bradycardic under pacer (I turned it down to 50, and he was still pacing).  Continue pacer at 80, hold on beta blocker. Vol overload- continue diuresis. H/o BPH- Flomax restarted.  Will leave Foley for now until seen by MD. Elevated CBGs- cont SSI.  A1C=6.2. Ambulate, pulm toilet.   LOS: 10 days    COLLINS,GINA H 11/28/2011

## 2011-11-28 NOTE — Progress Notes (Signed)
Patient examined and record reviewed.Hemodynamics stable,labs satisfactory.Patient had stable day.Continue current care.Leave Foley until more mobile due to advanced age,BPH on meds VAN TRIGT III,Ryan Pacheco 11/28/2011

## 2011-11-29 LAB — GLUCOSE, CAPILLARY
Glucose-Capillary: 93 mg/dL (ref 70–99)
Glucose-Capillary: 154 mg/dL — ABNORMAL HIGH (ref 70–99)
Glucose-Capillary: 105 mg/dL — ABNORMAL HIGH (ref 70–99)

## 2011-11-29 MED ORDER — METOPROLOL TARTRATE 12.5 MG HALF TABLET
12.5000 mg | ORAL_TABLET | Freq: Two times a day (BID) | ORAL | Status: DC
  Administered 2011-11-29: 12.5 mg via ORAL
  Filled 2011-11-29 (×2): qty 1

## 2011-11-29 MED ORDER — MOVING RIGHT ALONG BOOK
Freq: Once | Status: AC
  Administered 2011-11-30: 12:00:00
  Filled 2011-11-29: qty 1

## 2011-11-29 MED ORDER — FUROSEMIDE 40 MG PO TABS: 40.0000 mg | ORAL_TABLET | Freq: Every day | ORAL | Status: DC

## 2011-11-29 MED ORDER — METOPROLOL TARTRATE 25 MG PO TABS: 25.0000 mg | ORAL_TABLET | Freq: Two times a day (BID) | ORAL | Status: DC

## 2011-11-29 MED ORDER — FUROSEMIDE 40 MG PO TABS
40.0000 mg | ORAL_TABLET | Freq: Every day | ORAL | Status: DC
  Administered 2011-11-29 – 2011-12-02 (×4): 40 mg via ORAL
  Filled 2011-11-29 (×5): qty 1

## 2011-11-29 MED ORDER — SODIUM CHLORIDE 0.9 % IJ SOLN: 3.0000 mL | INTRAMUSCULAR | Status: DC | PRN

## 2011-11-29 MED ORDER — SODIUM CHLORIDE 0.9 % IJ SOLN
3.0000 mL | Freq: Two times a day (BID) | INTRAMUSCULAR | Status: DC
  Administered 2011-11-29 – 2011-12-03 (×9): 3 mL via INTRAVENOUS

## 2011-11-29 MED ORDER — SODIUM BICARBONATE 8.4 % IV SOLN
INTRAVENOUS | Status: AC
  Filled 2011-11-29: qty 100

## 2011-11-29 MED ORDER — SODIUM CHLORIDE 0.9 % IV SOLN: 250.0000 mL | INTRAVENOUS | Status: DC | PRN

## 2011-11-29 MED ORDER — METOPROLOL TARTRATE 25 MG PO TABS
25.0000 mg | ORAL_TABLET | Freq: Once | ORAL | Status: AC
  Administered 2011-11-29: 25 mg via ORAL
  Filled 2011-11-29: qty 1

## 2011-11-29 MED ORDER — METOPROLOL TARTRATE 25 MG PO TABS
25.0000 mg | ORAL_TABLET | Freq: Two times a day (BID) | ORAL | Status: DC
  Administered 2011-11-30 – 2011-12-03 (×6): 25 mg via ORAL
  Filled 2011-11-29 (×8): qty 1

## 2011-11-29 NOTE — Evaluation (Signed)
Physical Therapy Evaluation Patient Details Name: Ryan Pacheco MRN: [id number] DOB: 1920/05/24 Today's Date: 11/29/2011  Problem List: There is no problem list on file for this patient.   Past Medical History:  Past Medical History  Diagnosis Date  . Edema   . Acid reflux   . Benign prostatic hypertrophy   . Bradycardia   . Embolism - blood clot ~ 2000    "behind left knee"  . History of asbestos exposure     "have some lung disease from years of exposure"  . Angina   . Myocardial infarction 11/17/10     NSTEMI  . Arthritis    Past Surgical History:  Past Surgical History  Procedure Date  . Cholecystectomy 1980's  . Cataract extraction, bilateral ~ 2010  . Blepharoplasty     left eye  . Coronary artery bypass graft 11/26/2011    Procedure: CORONARY ARTERY BYPASS GRAFTING (CABG);  Surgeon: Alleen Borne, MD;  Location: [facility] OR;  Service: Open Heart Surgery;  Laterality: N/A;    PT Assessment/Plan/Recommendation PT Assessment Clinical Impression Statement: Pt s/p CABG. Pt educated for sternal precautions with handout provided and plans for SNF then to ALF which is an appropriate and suggested plan. Pt needs cues for mobility but do not feel he will need acute therapy and recommend continued ambulation with nursing and  cardiac rehab and defer further P.T. needs to SNF.  PT Recommendation/Assessment: All further PT needs can be met in the next venue of care PT Problem List: Decreased activity tolerance;Decreased knowledge of precautions PT Therapy Diagnosis : Abnormality of gait PT Recommendation Follow Up Recommendations: Skilled nursing facility Equipment Recommended: Defer to next venue PT Goals     PT Evaluation Precautions/Restrictions  Precautions Precautions: Sternal;Fall Restrictions Weight Bearing Restrictions: No Prior Functioning  Home Living Lives With: Alone Receives Help From: Family Type of Home: House Home Layout: One level Home Access: Ramped  entrance Bathroom Shower/Tub: Tub/shower unit Home Adaptive Equipment: Walker - rolling Prior Function Level of Independence: Independent with basic ADLs;Independent with transfers;Independent with homemaking with ambulation;Independent with gait Driving: No Vocation: Retired Comments: Pt states dgtr stops by a few times a week to help out with household bills and items. Pt was living alone but plans to D/C to SNF then transfer to ALF. Cognition Cognition Arousal/Alertness: Awake/alert Overall Cognitive Status: Appears within functional limits for tasks assessed Orientation Level: Oriented X4 Sensation/Coordination   Extremity Assessment RLE Assessment RLE Assessment: Within Functional Limits LLE Assessment LLE Assessment: Within Functional Limits Mobility (including Balance) Bed Mobility Bed Mobility: Yes Rolling Left: 6: Modified independent (Device/Increase time) Left Sidelying to Sit: 6: Modified independent (Device/Increase time) Sit to Sidelying Left: 6: Modified independent (Device/Increase time) (initial instruction for bed mobility then pt performed no A ) Transfers Transfers: Yes Sit to Stand: 5: Supervision Sit to Stand Details (indicate cue type and reason): cueing for hand placement on thighs for precautions Stand to Sit: 5: Supervision Ambulation/Gait Ambulation/Gait: Yes Ambulation/Gait Assistance: 4: Min assist Ambulation/Gait Assistance Details (indicate cue type and reason): pt with hand held assist to maintain balance and will benefit from RW use. Pt also needed cues for direction to room Ambulation Distance (Feet): 180 Feet Assistive device: 1 person hand held assist Gait Pattern: Decreased stride length;Trunk flexed Stairs: No  Posture/Postural Control Posture/Postural Control: Postural limitations Balance Balance Assessed: Yes Dynamic Standing Balance Dynamic Standing - Level of Assistance: 5: Stand by assistance Exercise    End of Session PT - End  of  Session Activity Tolerance: Patient tolerated treatment well Patient left: in chair Nurse Communication: Mobility status for transfers;Mobility status for ambulation General Behavior During Session: [facility] for tasks performed Cognition: [facility] for tasks performed  Delorse Lek 11/29/2011, 3:21 PM  Toney Sang, PT [phone]

## 2011-11-29 NOTE — Progress Notes (Signed)
SUBJECTIVE:  No  Chest pain last night.  Intrinsic heart rate now, while standing. Walking halls without problems. OBJECTIVE:   Vitals:   Filed Vitals:   11/29/11 0600 11/29/11 0700 11/29/11 0738 11/29/11 0800  BP: 107/57 105/49  97/47  Pulse: 68 69  68  Temp:   98.8 F (37.1 C)   TempSrc:   Oral   Resp: 14 22  25   Height:      Weight: 85.5 kg (188 lb 7.9 oz)     SpO2: 96% 98%  97%   I&O's:    Intake/Output Summary (Last 24 hours) at 11/29/11 1610 Last data filed at 11/29/11 0800  Gross per 24 hour  Intake   1030 ml  Output   2485 ml  Net  -1455 ml   TELEMETRY: paced rhythm     PHYSICAL EXAM General: Well developed, well nourished, in no acute distress Head:    Normal cephalic and atramatic  Lungs:   No wheeze Heart:   HRRR S1 S2  Abdomen: , abdomen soft and non-tender Msk:  Back normal,  Normal strength and tone for age. Extremities:   Trace edema.  3+ right radial pulse Neuro: Alert and oriented X 3. Psych:  Good affect, responds appropriately   LABS: Basic Metabolic Panel:  Basename 11/28/11 0445 11/27/11 1721 11/27/11 1700 11/27/11 0412  NA 135 138 -- --  K 3.8 3.9 -- --  CL 100 100 -- --  CO2 28 -- -- 27  GLUCOSE 128* 141* -- --  BUN 20 19 -- --  CREATININE 0.90 0.90 -- --  CALCIUM 8.5 -- -- 8.1*  MG -- -- 2.5 2.7*  PHOS -- -- -- --   Liver Function Tests: No results found for this basename: AST:2,ALT:2,ALKPHOS:2,BILITOT:2,PROT:2,ALBUMIN:2 in the last 72 hours No results found for this basename: LIPASE:2,AMYLASE:2 in the last 72 hours CBC:  Basename 11/28/11 0445 11/27/11 1721 11/27/11 1700  WBC 15.6* -- 14.2*  NEUTROABS -- -- --  HGB 9.5* 9.9* --  HCT 28.3* 29.0* --  MCV 90.7 -- 91.1  PLT 137* -- 135*   Cardiac Enzymes: No results found for this basename: CKTOTAL:3,CKMB:3,CKMBINDEX:3,TROPONINI:3 in the last 72 hours BNP: No components found with this basename: POCBNP:3 D-Dimer: No results found for this basename: DDIMER:2 in the last 72  hours Hemoglobin A1C: No results found for this basename: HGBA1C in the last 72 hours Fasting Lipid Panel: No results found for this basename: CHOL,HDL,LDLCALC,TRIG,CHOLHDL,LDLDIRECT in the last 72 hours Thyroid Function Tests: No results found for this basename: TSH,T4TOTAL,FREET3,T3FREE,THYROIDAB in the last 72 hours Anemia Panel: No results found for this basename: VITAMINB12,FOLATE,FERRITIN,TIBC,IRON,RETICCTPCT in the last 72 hours Coag Panel:   Lab Results  Component Value Date   INR 1.56* 11/26/2011   INR 0.99 11/18/2011    RADIOLOGY: Dg Chest Portable 1 View  11/18/2011  *RADIOLOGY REPORT*  Clinical Data: Chest pain, cough, shortness of breath  PORTABLE CHEST - 1 VIEW  Comparison: 04/02/2011  Findings: Cardiomediastinal silhouette is stable.  Bilateral pleural calcifications are again noted.  Worsening pleural calcifications in the left upper hemithorax.  Stable bilateral basilar atelectasis or scarring.  No convincing pulmonary edema or focal infiltrate.  IMPRESSION: Worsening pleural calcifications in the left upper hemithorax. Stable bilateral basilar atelectasis or scarring.  No convincing pulmonary edema or focal infiltrate.  Original Report Authenticated By: Natasha Mead, M.D.   ECG shows sinus bradycardia with mild inferolateral ST depressions   ASSESSMENT: Non-ST elevation MI  PLAN:   Bradycardia post CABG.  Heart rate has improved significantly today.  Feels well while walking.  Doing very well.    HTN-BP better  Anemia, Hbg stable;  Use incentive spirometer  Rashaun Curl S., MD  11/29/2011  9:22 AM

## 2011-11-29 NOTE — Progress Notes (Signed)
3 Days Post-Op Procedure(s) (LRB): CORONARY ARTERY BYPASS GRAFTING (CABG) (N/A) Subjective:                  [address]            [place] [address]          [phone]     OOB to cair,comfortable  Objective: Vital signs in last 24 hours: Temp:  [97.3 F (36.3 C)-100.2 F (37.9 C)] 98.8 F (37.1 C) (02/01 0738) Pulse Rate:  [43-80] 68  (02/01 0800) Cardiac Rhythm:  [-] Normal sinus rhythm (02/01 0800) Resp:  [14-40] 25  (02/01 0800) BP: (82-124)/(46-62) 97/47 mmHg (02/01 0800) SpO2:  [75 %-100 %] 97 % (02/01 0800) Weight:  [188 lb 7.9 oz (85.5 kg)] 188 lb 7.9 oz (85.5 kg) (02/01 0600)  Hemodynamic parameters for last 24 hours:    Intake/Output from previous day: 01/31 0701 - 02/01 0700 In: 1310 [P.O.:840; I.V.:420; IV Piggyback:50] Out: 2800 [Urine:2800] Intake/Output this shift: Total I/O In: -  Out: 35 [Urine:35]  EXAM  NSR Lungs clear O2 sat 98  Lab Results:  Basename 11/28/11 0445 11/27/11 1721 11/27/11 1700  WBC 15.6* -- 14.2*  HGB 9.5* 9.9* --  HCT 28.3* 29.0* --  PLT 137* -- 135*   BMET:  Basename 11/28/11 0445 11/27/11 1721 11/27/11 0412  NA 135 138 --  K 3.8 3.9 --  CL 100 100 --  CO2 28 -- 27  GLUCOSE 128* 141* --  BUN 20 19 --  CREATININE 0.90 0.90 --  CALCIUM 8.5 -- 8.1*    PT/INR:  Basename 11/26/11 1254  LABPROT 19.0*  INR 1.56*   ABG    Component Value Date/Time   PHART 7.353 11/26/2011 1814   HCO3 26.5* 11/26/2011 1814   TCO2 26 11/27/2011 1721   ACIDBASEDEF 2.0 11/26/2011 1706   O2SAT 97.0 11/26/2011 1814   CBG (last 3)   Basename 11/29/11 0744 11/29/11 0356 11/28/11 2351  GLUCAP 93 104* 111*    Assessment/Plan: S/P Procedure(s) (LRB): CORONARY ARTERY BYPASS GRAFTING (CABG) (N/A) Plan for transfer to step-down: see transfer orders   LOS: 11 days    Ryan Pacheco,Ryan Pacheco 11/29/2011

## 2011-11-29 NOTE — Progress Notes (Signed)
UR Completed.  Ryan Pacheco [phone] 11/29/2011  

## 2011-11-29 NOTE — Progress Notes (Signed)
SUBJECTIVE:  No  Chest pain last night.  Still dependent on external pacer. Walking halls without problems. OBJECTIVE:   Vitals:   Filed Vitals:   11/29/11 0600 11/29/11 0700 11/29/11 0738 11/29/11 0800  BP: 107/57 105/49  97/47  Pulse: 68 69  68  Temp:   98.8 F (37.1 C)   TempSrc:   Oral   Resp: 14 22  25   Height:      Weight: 85.5 kg (188 lb 7.9 oz)     SpO2: 96% 98%  97%   I&O's:    Intake/Output Summary (Last 24 hours) at 11/29/11 0920 Last data filed at 11/29/11 0800  Gross per 24 hour  Intake   1030 ml  Output   2485 ml  Net  -1455 ml   TELEMETRY: paced rhythm     PHYSICAL EXAM General: Well developed, well nourished, in no acute distress Head:    Normal cephalic and atramatic  Lungs:   No wheeze Heart:   HRRR S1 S2  Abdomen: , abdomen soft and non-tender Msk:  Back normal,  Normal strength and tone for age. Extremities:   Trace edema.  3+ right radial pulse Neuro: Alert and oriented X 3. Psych:  Good affect, responds appropriately   LABS: Basic Metabolic Panel:  Basename 11/28/11 0445 11/27/11 1721 11/27/11 1700 11/27/11 0412  NA 135 138 -- --  K 3.8 3.9 -- --  CL 100 100 -- --  CO2 28 -- -- 27  GLUCOSE 128* 141* -- --  BUN 20 19 -- --  CREATININE 0.90 0.90 -- --  CALCIUM 8.5 -- -- 8.1*  MG -- -- 2.5 2.7*  PHOS -- -- -- --   Liver Function Tests: No results found for this basename: AST:2,ALT:2,ALKPHOS:2,BILITOT:2,PROT:2,ALBUMIN:2 in the last 72 hours No results found for this basename: LIPASE:2,AMYLASE:2 in the last 72 hours CBC:  Basename 11/28/11 0445 11/27/11 1721 11/27/11 1700  WBC 15.6* -- 14.2*  NEUTROABS -- -- --  HGB 9.5* 9.9* --  HCT 28.3* 29.0* --  MCV 90.7 -- 91.1  PLT 137* -- 135*   Cardiac Enzymes: No results found for this basename: CKTOTAL:3,CKMB:3,CKMBINDEX:3,TROPONINI:3 in the last 72 hours BNP: No components found with this basename: POCBNP:3 D-Dimer: No results found for this basename: DDIMER:2 in the last 72  hours Hemoglobin A1C: No results found for this basename: HGBA1C in the last 72 hours Fasting Lipid Panel: No results found for this basename: CHOL,HDL,LDLCALC,TRIG,CHOLHDL,LDLDIRECT in the last 72 hours Thyroid Function Tests: No results found for this basename: TSH,T4TOTAL,FREET3,T3FREE,THYROIDAB in the last 72 hours Anemia Panel: No results found for this basename: VITAMINB12,FOLATE,FERRITIN,TIBC,IRON,RETICCTPCT in the last 72 hours Coag Panel:   Lab Results  Component Value Date   INR 1.56* 11/26/2011   INR 0.99 11/18/2011    RADIOLOGY: Dg Chest Portable 1 View  11/18/2011  *RADIOLOGY REPORT*  Clinical Data: Chest pain, cough, shortness of breath  PORTABLE CHEST - 1 VIEW  Comparison: 04/02/2011  Findings: Cardiomediastinal silhouette is stable.  Bilateral pleural calcifications are again noted.  Worsening pleural calcifications in the left upper hemithorax.  Stable bilateral basilar atelectasis or scarring.  No convincing pulmonary edema or focal infiltrate.  IMPRESSION: Worsening pleural calcifications in the left upper hemithorax. Stable bilateral basilar atelectasis or scarring.  No convincing pulmonary edema or focal infiltrate.  Original Report Authenticated By: Natasha Mead, M.D.   ECG shows sinus bradycardia with mild inferolateral ST depressions   ASSESSMENT: Non-ST elevation MI  PLAN:   Bradycardia post CABG.  Will follow.  Hopefully, conduction system will wake up and PPM will not be needed.  Feels well while walking.  Doing very well.    HTN-BP better after meds. Anemia, Hbg stable;  Use incentive spirometer  Lineth Thielke S., MD  11/29/2011  9:20 AM

## 2011-11-29 NOTE — Progress Notes (Signed)
Clinical Social Worker completed the psychosocial assessment which can be found in the shadow chart. CSW has initiated SNF search, family states preference for [facility].  CSW will continue to follow tofacilitate d/c planning, as appropriate to pt medical progression.  Baxter Flattery, MSW [phone]

## 2011-11-29 NOTE — Plan of Care (Signed)
Problem: Phase III Progression Outcomes Goal: Time patient transferred to PCTU/Telemetry POD Outcome: Completed/Met Date Met:  11/29/11 1115am

## 2011-11-29 NOTE — Progress Notes (Signed)
Pt converted back into sinus rhythm at 21:58  [facility]

## 2011-11-29 NOTE — Progress Notes (Signed)
   CARE MANAGEMENT NOTE 11/29/2011  Patient:  Ryan Pacheco, Ryan Pacheco   Account Number:  [id number]  Date Initiated:  11/25/2011  Documentation initiated by:  GRAVES-BIGELOW,BRENDA  Subjective/Objective Assessment:   Pt admitted with cp. Per  MD notes plan for CABG 11-26-11.  Lives at home alone.     Action/Plan:   PTA, PT INDEPENDENT OF ADLS; LIVES ALONE.  PT HAS SUPPORTIVE GROWN CHILDREN.   Anticipated DC Date:  12/02/2011   Anticipated DC Plan:  HOME W HOME HEALTH SERVICES  In-house referral  Clinical Social Worker      DC Planning Services  CM consult      Choice offered to / List presented to:             Status of service:  In process, will continue to follow Medicare Important Message given?   (If response is "NO", the following Medicare IM given date fields will be blank) Date Medicare IM given:   Date Additional Medicare IM given:    Discharge Disposition:    Per UR Regulation:  Reviewed for med. necessity/level of care/duration of stay  Comments:  11/28/11 Chandria Rookstool,RN,BSN 1530 MET WITH PT TO DISCUSS DC PLANS.  PT STATES CHILDREN WORK, AND CANNOT PROVIDE 24HR CARE AT DISCHARGE.  HE IS AGREEABLE TO SHORT TERM SNF FOR REHAB AT DISCHARGE.  WILL CONSULT CSW TO FACILITATE DISCHARGE TO SNF WHEN MEDICALLY STABLE. Phone #[phone]  11-26-11 11:45am Johny Shears - [phone] UR Completed.  11-25-11 1538 Tomi Bamberger, [place] [phone] CM will continue to monitor for d/c disposition.

## 2011-11-29 NOTE — Significant Event (Signed)
Pt is safely in 2016, settled in chair. VS stable prior and during the transfer. Pt son at bedside. Pt primary RN in the room. All belongings taken to 2000. Enna Warwick, Charity fundraiser.

## 2011-11-30 ENCOUNTER — Inpatient Hospital Stay (HOSPITAL_COMMUNITY): Payer: Medicare Other

## 2011-11-30 LAB — URINALYSIS, ROUTINE W REFLEX MICROSCOPIC
Glucose, UA: NEGATIVE mg/dL
Nitrite: NEGATIVE
Specific Gravity, Urine: 1.017 (ref 1.005–1.030)
pH: 5.5 (ref 5.0–8.0)

## 2011-11-30 LAB — BASIC METABOLIC PANEL
BUN: 21 mg/dL (ref 6–23)
CO2: 24 mEq/L (ref 19–32)
Calcium: 8.9 mg/dL (ref 8.4–10.5)
Chloride: 102 mEq/L (ref 96–112)
Creatinine, Ser: 0.94 mg/dL (ref 0.50–1.35)
GFR calc Af Amer: 83 mL/min — ABNORMAL LOW (ref >90)
GFR calc non Af Amer: 71 mL/min — ABNORMAL LOW (ref >90)
Glucose, Bld: 132 mg/dL — ABNORMAL HIGH (ref 70–99)
Potassium: 3.6 mEq/L (ref 3.5–5.1)
Sodium: 139 mEq/L (ref 135–145)

## 2011-11-30 LAB — CBC
HCT: 29.6 % — ABNORMAL LOW (ref 39.0–52.0)
Hemoglobin: 9.9 g/dL — ABNORMAL LOW (ref 13.0–17.0)
MCH: 30.6 pg (ref 26.0–34.0)
MCHC: 33.4 g/dL (ref 30.0–36.0)
MCV: 91.4 fL (ref 78.0–100.0)
Platelets: 241 10*3/uL (ref 150–400)
RBC: 3.24 MIL/uL — ABNORMAL LOW (ref 4.22–5.81)
RDW: 14.5 % (ref 11.5–15.5)
WBC: 19.6 10*3/uL — ABNORMAL HIGH (ref 4.0–10.5)

## 2011-11-30 LAB — URINE MICROSCOPIC-ADD ON

## 2011-11-30 LAB — GLUCOSE, CAPILLARY
Glucose-Capillary: 103 mg/dL — ABNORMAL HIGH (ref 70–99)
Glucose-Capillary: 76 mg/dL (ref 70–99)

## 2011-11-30 MED ORDER — METFORMIN HCL 500 MG PO TABS
500.0000 mg | ORAL_TABLET | Freq: Two times a day (BID) | ORAL | Status: DC
  Administered 2011-11-30 – 2011-12-03 (×6): 500 mg via ORAL
  Filled 2011-11-30 (×8): qty 1

## 2011-11-30 MED ORDER — POTASSIUM CHLORIDE CRYS ER 20 MEQ PO TBCR
EXTENDED_RELEASE_TABLET | ORAL | Status: AC
  Administered 2011-11-30: 20 meq
  Filled 2011-11-30: qty 2

## 2011-11-30 MED ORDER — FERROUS SULFATE 325 (65 FE) MG PO TABS
325.0000 mg | ORAL_TABLET | Freq: Every day | ORAL | Status: DC
  Administered 2011-11-30 – 2011-12-03 (×4): 325 mg via ORAL
  Filled 2011-11-30 (×4): qty 1

## 2011-11-30 NOTE — Progress Notes (Addendum)
4 Days Post-Op Procedure(s) (LRB): CORONARY ARTERY BYPASS GRAFTING (CABG) (N/A)  Subjective: Patient with loose stools.  Objective: Vital signs in last 24 hours: Patient Vitals for the past 24 hrs:  BP Temp Temp src Pulse Resp SpO2 Weight  11/30/11 0351 158/63 mmHg 97.3 F (36.3 C) Oral 92  20  98 % 186 lb (84.369 kg)  11/29/11 2017 100/65 mmHg 97.7 F (36.5 C) Oral 75  22  100 % -  11/29/11 1758 126/40 mmHg - - 97  22  93 % -  11/29/11 1509 - - - 83  - - -  11/29/11 1500 - - - 103  - - -  11/29/11 1433 135/68 mmHg 97.6 F (36.4 C) Oral 80  22  95 % -  11/29/11 1123 138/47 mmHg - - 78  22  98 % -  11/29/11 1007 122/53 mmHg - - 81  25  98 % -   Pre op weight  82 kg Current Weight  11/30/11 186 lb (84.369 kg)      Intake/Output from previous day: 02/01 0701 - 02/02 0700 In: 300 [P.O.:300] Out: 126 [Urine:125; Stool:1]   Physical Exam:  Cardiovascular: RRR, no murmurs, gallops, or rubs. Pulmonary: Clear to auscultation bilaterally; no rales, wheezes, or rhonchi. Abdomen: Soft, non tender, bowel sounds present. Extremities: Mild bilateral lower extremity edema. Wounds: Clean and dry.  No erythema or signs of infection.  Lab Results: CBC: Basename 11/30/11 0630 11/28/11 0445  WBC 19.6* 15.6*  HGB 9.9* 9.5*  HCT 29.6* 28.3*  PLT 241 137*   BMET:  Basename 11/30/11 0630 11/28/11 0445  NA 139 135  K 3.6 3.8  CL 102 100  CO2 24 28  GLUCOSE 132* 128*  BUN 21 20  CREATININE 0.94 0.90  CALCIUM 8.9 8.5    PT/INR: No results found for this basename: LABPROT,INR in the last 72 hours ABG:  INR: Will add last result for INR, ABG once components are confirmed Will add last 4 CBG results once components are confirmed  Assessment/Plan:  1. CV - Had 1.29 second pause 5:22am and some PVCs.Afib with CVR yesterday.Continue Lopressor 25 bid.Monitor blood pressure as may need another medicine. 2.  Pulmonary - Encourage incentive spirometer and flutter valve.CXR this am  shows no pneumothorax,small b/l pleural effusions, and emphysema. 3. Volume Overload - Continue with diuresis 4.  Acute blood loss anemia -H/H 9.9/29.6. 5.Leukocytosis-Remains afebrile. No signs of wound infection and no pneumonia on CXR. Check UA. 6.BPH-already on Flomax. Bladder scan this am showed 1000 ccs. Foley re inserted. 7.DM-CBGs 75/91/114.Pre op HGA1C 6.2Stop scheduled insulin and start Metformin.. Will require follow up as outpatient. 8.Thrombocytopenia resolved as platelets now increased to 241,000. 9.Stop laxatives.   ZIMMERMAN,DONIELLE MPA-C 11/30/2011  Now in sinus  I have seen and examined Ryan Pacheco and agree with the above assessment  and plan.  Delight Ovens MD Beeper [phone] Office [phone] 11/30/2011 1:05 PM

## 2011-11-30 NOTE — Progress Notes (Signed)
CARDIAC REHAB PHASE I   PRE:  Rate/Rhythm: 58 sinus brady  BP:  Supine:   Sitting: 98/60  Standing:    SaO2: 98%  MODE:  Ambulation: 380 ft   POST:  Rate/Rhythem: 83 sinus rhythm  BP:  Supine:   Sitting: 120/58  Standing:    SaO2: 98% 1415-1445 Pt ambulated in hallway x1 assist with rolling walker.  Pt denies chest pain or dyspnea.  Pt educated in deep breath/cough/IS use, sternal precautions.  Understanding verbalized.  Pt and dtr offered to watch CABG video, both declined.  Encouraged to read post op booklet.  -jr,rn  Dan Europe

## 2011-12-01 LAB — GLUCOSE, CAPILLARY
Glucose-Capillary: 96 mg/dL (ref 70–99)
Glucose-Capillary: 91 mg/dL (ref 70–99)
Glucose-Capillary: 120 mg/dL — ABNORMAL HIGH (ref 70–99)

## 2011-12-01 LAB — URINALYSIS, ROUTINE W REFLEX MICROSCOPIC
Glucose, UA: NEGATIVE mg/dL
Ketones, ur: 15 mg/dL — AB
Nitrite: NEGATIVE
Specific Gravity, Urine: 1.022 (ref 1.005–1.030)
pH: 6 (ref 5.0–8.0)

## 2011-12-01 LAB — CBC
HCT: 24.7 % — ABNORMAL LOW (ref 39.0–52.0)
Hemoglobin: 8.4 g/dL — ABNORMAL LOW (ref 13.0–17.0)
RBC: 2.71 MIL/uL — ABNORMAL LOW (ref 4.22–5.81)
WBC: 12.5 10*3/uL — ABNORMAL HIGH (ref 4.0–10.5)

## 2011-12-01 LAB — URINE MICROSCOPIC-ADD ON

## 2011-12-01 MED ORDER — SULFAMETHOXAZOLE-TRIMETHOPRIM 400-80 MG PO TABS
1.0000 | ORAL_TABLET | Freq: Two times a day (BID) | ORAL | Status: DC
  Administered 2011-12-01 – 2011-12-03 (×5): 1 via ORAL
  Filled 2011-12-01 (×6): qty 1

## 2011-12-01 MED ORDER — INSULIN ASPART 100 UNIT/ML ~~LOC~~ SOLN: 0.0000 [IU] | Freq: Three times a day (TID) | SUBCUTANEOUS | Status: DC

## 2011-12-01 NOTE — Progress Notes (Addendum)
5 Days Post-Op Procedure(s) (LRB): CORONARY ARTERY BYPASS GRAFTING (CABG) (N/A)  Subjective:   Objective: Vital signs in last 24 hours: Patient Vitals for the past 24 hrs:  BP Temp Temp src Pulse Resp SpO2 Weight  12/01/11 0549 146/78 mmHg 97.9 F (36.6 C) Oral 64  14  97 % -  12/01/11 0515 - - - - - - 170 lb 3.1 oz (77.2 kg)  11/30/11 2009 121/68 mmHg 98 F (36.7 C) Oral 66  18  99 % -  11/30/11 1413 98/60 mmHg - - 58  - 98 % -   Pre op weight  82 kg Current Weight  12/01/11 170 lb 3.1 oz (77.2 kg)      Intake/Output from previous day: 02/02 0701 - 02/03 0700 In: 600 [P.O.:600] Out: 2325 [Urine:2325]   Physical Exam:  Cardiovascular: RRR, no murmurs, gallops, or rubs. Pulmonary: Clear to auscultation bilaterally; no rales, wheezes, or rhonchi. Abdomen: Soft, non tender, bowel sounds present. Extremities: Mild bilateral lower extremity edema. Wounds: Clean and dry.  No erythema or signs of infection.  Lab Results: CBC:  Basename 12/01/11 0530 11/30/11 0630  WBC 12.5* 19.6*  HGB 8.4* 9.9*  HCT 24.7* 29.6*  PLT 222 241   BMET:   Basename 11/30/11 0630  NA 139  K 3.6  CL 102  CO2 24  GLUCOSE 132*  BUN 21  CREATININE 0.94  CALCIUM 8.9    PT/INR: No results found for this basename: LABPROT,INR in the last 72 hours ABG:  INR: Will add last result for INR, ABG once components are confirmed Will add last 4 CBG results once components are confirmed  Assessment/Plan:  1. CV - Has had 1.29 second pause, some PVCs, and Afib with CVR .Maintaining SR with some PVCs last 24 hours.Continue Lopressor 25 bid. 2.  Pulmonary - Encourage incentive spirometer and flutter valve. 3. Volume Overload - Continue with diuresis 4.  Acute blood loss anemia -H/H 9.9/29.6. 5.Leukocytosis-WBC decreased to 12,500.Remains afebrile. No signs of wound infection and no pneumonia on CXR. UA showed hyaline casts, few bacteria, and moderate leukocytes. Will start Bactrim DS and await  culture. 6.BPH-Continue  Flomax. Bladder scan yestereday showed 1000 ccs. Foley re inserted yesterday. Remove in am. Consult urology in am. 7.DM-CBGs 103/96/91.Pre op HGA1C 6.2.Continue with Metformin. Will require follow up as outpatient. 8.Thrombocytopenia resolved as platelets now increased to 241,000. 9.Remove EPW am.    ZIMMERMAN,DONIELLE MPA-C 12/01/2011  Holding in sinus today Developed acute urinary retention with 1000 ml residual and foley replaced Will need Urology to  see before d/c home to arrange followup and foley removal I have seen and examined Ryan Pacheco and agree with the above assessment  and plan.  Delight Ovens MD Beeper [phone] Office [phone] 12/01/2011 11:50 AM

## 2011-12-01 NOTE — Progress Notes (Signed)
EPW discontinued per protocol. Tips intact. Patient tolerated well. Last INR INR/Prothrombin Time on   .  Patient advised Bedrest X 1 hour. Elease Hashimoto RN.   Chest tube sutures also removed, steri strips applied. Patient tolerated well. Elease Hashimoto

## 2011-12-02 LAB — GLUCOSE, CAPILLARY
Glucose-Capillary: 98 mg/dL (ref 70–99)
Glucose-Capillary: 103 mg/dL — ABNORMAL HIGH (ref 70–99)

## 2011-12-02 MED ORDER — POTASSIUM CHLORIDE CRYS ER 20 MEQ PO TBCR
40.0000 meq | EXTENDED_RELEASE_TABLET | Freq: Every day | ORAL | Status: DC
  Administered 2011-12-02 – 2011-12-03 (×2): 40 meq via ORAL
  Filled 2011-12-02 (×2): qty 2

## 2011-12-02 MED FILL — Heparin Sodium (Porcine) Inj 1000 Unit/ML: INTRAMUSCULAR | Qty: 10 | Status: AC

## 2011-12-02 MED FILL — Electrolyte-R (PH 7.4) Solution: INTRAVENOUS | Qty: 5000 | Status: AC

## 2011-12-02 MED FILL — Sodium Chloride IV Soln 0.9%: INTRAVENOUS | Qty: 1000 | Status: AC

## 2011-12-02 MED FILL — Sodium Chloride Irrigation Soln 0.9%: Qty: 3000 | Status: AC

## 2011-12-02 MED FILL — Mannitol IV Soln 20%: INTRAVENOUS | Qty: 500 | Status: AC

## 2011-12-02 MED FILL — Sodium Bicarbonate IV Soln 8.4%: INTRAVENOUS | Qty: 50 | Status: AC

## 2011-12-02 MED FILL — Heparin Sodium (Porcine) Inj 1000 Unit/ML: INTRAMUSCULAR | Qty: 30 | Status: AC

## 2011-12-02 NOTE — Progress Notes (Signed)
CARDIAC REHAB PHASE I   PRE:  Rate/Rhythm: 70 SR  BP:  Supine:   Sitting: 123/44  Standing:    SaO2: 96 RA  MODE:  Ambulation: 350 ft   POST:  Rate/Rhythem: 105 ST PVC  BP:  Supine:   Sitting: 146/59  Standing:    SaO2: 98 RA 0820-0905  Assisted X 1 and used walker to ambulate. Gait steady with walker , does tire easily and is DOE. To bathroom after walk, was incontinent of liquid drops of stool in hall during walk. To recliner after walk with call light in reach.  Ryan Pacheco

## 2011-12-02 NOTE — Progress Notes (Signed)
UR Completed.  Ryan Pacheco [phone] 12/02/2011  

## 2011-12-02 NOTE — Progress Notes (Addendum)
                    [address]            [place] [address]          [phone]     6 Days Post-Op Procedure(s) (LRB): CORONARY ARTERY BYPASS GRAFTING (CABG) (N/A)  Subjective: Feels well, no complaints.    Objective: Vital signs in last 24 hours: Patient Vitals for the past 24 hrs:  BP Temp Temp src Pulse Resp SpO2 Height Weight  12/02/11 0500 119/52 mmHg 97.6 F (36.4 C) Oral 63  18  97 % 5\' 8"  (1.727 m) 83.7 kg (184 lb 8.4 oz)  12/01/11 1955 105/60 mmHg 97.3 F (36.3 C) Oral 56  24  94 % - -  12/01/11 1241 104/44 mmHg 97.5 F (36.4 C) Oral 55  20  96 % - -  12/01/11 1129 120/54 mmHg - - 63  16  96 % - -  12/01/11 1115 110/60 mmHg - - 70  16  96 % - -   Current Weight  12/02/11 83.7 kg (184 lb 8.4 oz)  pre-op wt= 82 kg   Intake/Output from previous day: 02/03 0701 - 02/04 0700 In: 720 [P.O.:720] Out: 875 [Urine:875]  CBGs 120-100-97  PHYSICAL EXAM:  Heart: RRR Lungs: clear Wound: clean and dry Extremities:+ LE edema  Lab Results: CBC: Basename 12/01/11 0530 11/30/11 0630  WBC 12.5* 19.6*  HGB 8.4* 9.9*  HCT 24.7* 29.6*  PLT 222 241   BMET:  Basename 11/30/11 0630  NA 139  K 3.6  CL 102  CO2 24  GLUCOSE 132*  BUN 21  CREATININE 0.94  CALCIUM 8.9    PT/INR: No results found for this basename: LABPROT,INR in the last 72 hours   Assessment/Plan: S/P Procedure(s) (LRB): CORONARY ARTERY BYPASS GRAFTING (CABG) (N/A) CV- some bradycardia in 50s, which is baseline for him.  BPs stable.  Continue Lopressor. GU- urinary retention, BPH.  Foley reinserted on Saturday.  Flomax restarted.  Will attempt voiding trial today.  May need to d/c with Foley in place and OP f/u with urology (has seen Dr. Patsi Sears in past). Vol overload- diurese DM- sugars stable on Metformin CRPI/PT Disp- SNF soon.   LOS: 14 days    COLLINS,GINA H 12/02/2011    Chart reviewed, patient examined, agree with above. Foley removed earlier today and he  says he has passed a little urine but unable to quantify. Will see how he does overnight.  He may need bladder scan.  He is otherwise about ready to go to SNF.

## 2011-12-02 NOTE — Progress Notes (Signed)
9:47 AM - d/c foley per protocol and as ordered, pt tolerated will,  will continue to monitor.  Ninetta Lights 12/02/2011

## 2011-12-02 NOTE — Progress Notes (Signed)
Patient's bladder was scanned. Patient has in bladder. Patient does not have any discomfort at the moment. Will continue to monitor.

## 2011-12-02 NOTE — Progress Notes (Signed)
Pt with bed available at [facility]. MD, please advise when pt is medically ready for d/c. Baxter Flattery, MSW [phone]

## 2011-12-03 DIAGNOSIS — I2510 Atherosclerotic heart disease of native coronary artery without angina pectoris: Secondary | ICD-10-CM | POA: Diagnosis not present

## 2011-12-03 DIAGNOSIS — Z5189 Encounter for other specified aftercare: Secondary | ICD-10-CM | POA: Diagnosis not present

## 2011-12-03 DIAGNOSIS — I219 Acute myocardial infarction, unspecified: Secondary | ICD-10-CM | POA: Diagnosis not present

## 2011-12-03 DIAGNOSIS — E119 Type 2 diabetes mellitus without complications: Secondary | ICD-10-CM | POA: Diagnosis not present

## 2011-12-03 DIAGNOSIS — N400 Enlarged prostate without lower urinary tract symptoms: Secondary | ICD-10-CM | POA: Diagnosis not present

## 2011-12-03 DIAGNOSIS — Z951 Presence of aortocoronary bypass graft: Secondary | ICD-10-CM | POA: Diagnosis not present

## 2011-12-03 DIAGNOSIS — R972 Elevated prostate specific antigen [PSA]: Secondary | ICD-10-CM | POA: Diagnosis not present

## 2011-12-03 DIAGNOSIS — D649 Anemia, unspecified: Secondary | ICD-10-CM | POA: Diagnosis not present

## 2011-12-03 DIAGNOSIS — E7800 Pure hypercholesterolemia, unspecified: Secondary | ICD-10-CM | POA: Diagnosis not present

## 2011-12-03 DIAGNOSIS — R339 Retention of urine, unspecified: Secondary | ICD-10-CM | POA: Diagnosis not present

## 2011-12-03 DIAGNOSIS — D62 Acute posthemorrhagic anemia: Secondary | ICD-10-CM | POA: Diagnosis not present

## 2011-12-03 DIAGNOSIS — N401 Enlarged prostate with lower urinary tract symptoms: Secondary | ICD-10-CM | POA: Diagnosis not present

## 2011-12-03 DIAGNOSIS — E871 Hypo-osmolality and hyponatremia: Secondary | ICD-10-CM | POA: Diagnosis not present

## 2011-12-03 DIAGNOSIS — N390 Urinary tract infection, site not specified: Secondary | ICD-10-CM | POA: Diagnosis not present

## 2011-12-03 DIAGNOSIS — I2109 ST elevation (STEMI) myocardial infarction involving other coronary artery of anterior wall: Secondary | ICD-10-CM | POA: Diagnosis not present

## 2011-12-03 LAB — GLUCOSE, CAPILLARY
Glucose-Capillary: 94 mg/dL (ref 70–99)
Glucose-Capillary: 99 mg/dL (ref 70–99)

## 2011-12-03 MED ORDER — METFORMIN HCL 500 MG PO TABS: 500.0000 mg | ORAL_TABLET | Freq: Two times a day (BID) | ORAL | Status: DC

## 2011-12-03 MED ORDER — FERROUS SULFATE 325 (65 FE) MG PO TABS: 325.0000 mg | ORAL_TABLET | Freq: Every day | ORAL | Status: DC

## 2011-12-03 MED ORDER — ASPIRIN 325 MG PO TABS
325.0000 mg | ORAL_TABLET | Freq: Every day | ORAL | Status: DC
  Filled 2011-12-03: qty 1

## 2011-12-03 MED ORDER — OXYCODONE HCL 5 MG PO TABS: 5.0000 mg | ORAL_TABLET | ORAL | Status: AC | PRN

## 2011-12-03 MED ORDER — FUROSEMIDE 10 MG/ML IJ SOLN
40.0000 mg | Freq: Once | INTRAMUSCULAR | Status: AC
  Administered 2011-12-03: 40 mg via INTRAVENOUS
  Filled 2011-12-03: qty 4

## 2011-12-03 MED ORDER — SULFAMETHOXAZOLE-TRIMETHOPRIM 400-80 MG PO TABS: 1.0000 | ORAL_TABLET | Freq: Two times a day (BID) | ORAL | Status: AC

## 2011-12-03 MED ORDER — ASPIRIN 325 MG PO TABS: 325.0000 mg | ORAL_TABLET | Freq: Every day | ORAL | Status: DC

## 2011-12-03 MED ORDER — ROSUVASTATIN CALCIUM 10 MG PO TABS: 10.0000 mg | ORAL_TABLET | Freq: Every day | ORAL | Status: DC

## 2011-12-03 MED ORDER — METOPROLOL TARTRATE 25 MG PO TABS: 25.0000 mg | ORAL_TABLET | Freq: Two times a day (BID) | ORAL | Status: DC

## 2011-12-03 MED ORDER — POTASSIUM CHLORIDE CRYS ER 20 MEQ PO TBCR: 40.0000 meq | EXTENDED_RELEASE_TABLET | Freq: Every day | ORAL | Status: DC

## 2011-12-03 NOTE — Progress Notes (Signed)
Patient stated "I'm suffering and I feel miserable".  Patient asked to go to the bathroom to attempt to void.  He was unsuccessful with voiding. I scanned his bladder again and his urinary retention had increased to 518 ml.  MD was notified and orders received by Dr. Laneta Simmers to insert foley catheter. Will continue to monitor.

## 2011-12-03 NOTE — Discharge Summary (Signed)
[address]            [place] [address]          [phone]         Discharge Summary  Name: Ryan Pacheco DOB: Aug 12, 1920 78 y.o. MRN: [id number]  Admission Date: 11/18/2011 Discharge Date:    Admitting Diagnosis: Chest pain  Discharge Diagnosis:  Chest pain Non-ST segment elevation myocardial infarction Severe multivessel coronary artery disease Chronic lower extremity edema Acid reflux Benign prostatic hypertrophy Postoperative urinary retention Baseline bradycardia History of left lower extremity DVT History of asbestos exposure Arthritis History of tobacco use Type 2 diabetes mellitus   Procedures: Procedure(s): CORONARY ARTERY BYPASS GRAFTING (CABG) x 4 (left internal mammary artery to the LAD, saphenous vein graft to the diagonal, saphenous vein graft to the first obtuse marginal, saphenous vein graft to the second obtuse marginal), EVH right leg on 11/26/2011   HPI:  The patient is a 78 y.o. male  with no prior cardiac history. He had a cold about a week ago. He was coughing a lot and developed some chest pain with coughing.The cold resolved but the chest pain persisted. It was somewhat better when he would rest and worse when he would walk. He came to the emergency room for evaluation after calling his primary care physician. His troponin was abnormal at 0.17. His ECG was not diagnostic for an MI. He was admitted for further evaluation and treatment.  Hospital Course:  The patient was admitted to [facility] on 11/18/2011.he continued to have some intermittent chest discomfort. He underwent cardiac catheterization by Dr.Varanasi on 11/20/2011 which showed severe multivessel coronary artery disease involving the distal left main and extending into the ostial circumflex and ostial LAD. Ejection fraction was 50%. It was felt that his best course of treatment would be surgical revascularization since he had failed medical therapy  in the hospital. Dr. Evelene Croon was consulted and agreed with the recommendation for CABG.All risks, benefits and alternatives of surgery were explained in detail, and the patient agreed to proceed. The patient was taken to the operating room and underwent the above procedure.    The postoperative course was notable for bradycardia. He was not initially started on a beta blocker and did require pacing. His heart rate did improve and he was able to be started on a low dose beta blocker. He did have a brief run of atrial fibrillation but converted back to normal sinus rhythm spontaneously. He has continued to have episodes of bradycardia with heart rates into the 50s which is his baseline.  His main postoperative complication has been urinary retention. He has a history of BPH and has seen Dr. Patsi Sears in the past. He required replacement of his Foley catheter early postop, but failed a voiding trial and required catheter replacement again. He was started on Flomax. It is felt that he may be discharged with catheter in place an outpatient followup with [facility].  He currently is progressing well. He has had elevated blood sugars postop, and with a hemoglobin A1c of 6.2, was started on metformin. He also had a mild postop blood loss anemia and was started on iron. He has been ambulating in the halls without difficulty. He is tolerating a regular diet. He has remained afebrile and vital signs have been stable. He has been evaluated by a clinical Child psychotherapist for skilled nursing  facility placement at the time of discharge and currently is medically stable once a bed is available.  Recent vital signs:  Filed Vitals:   12/03/11 0500  BP: 113/56  Pulse: 56  Temp: 97.6 F (36.4 C)  Resp: 20    Recent laboratory studies:  CBC: Basename 12/01/11 0530  WBC 12.5*  HGB 8.4*  HCT 24.7*  PLT 222   BMET: No results found for this basename:  NA:2,K:2,CL:2,CO2:2,GLUCOSE:2,BUN:2,CREATININE:2,CALCIUM:2 in the last 72 hours  PT/INR: No results found for this basename: LABPROT,INR in the last 72 hours  Discharge Medications:   Medication List  As of 12/03/2011 10:48 AM   STOP taking these medications         aspirin EC 81 MG tablet         TAKE these medications         aspirin 325 MG tablet   Take 1 tablet (325 mg total) by mouth daily.      esomeprazole 40 MG capsule   Commonly known as: NEXIUM   Take 40 mg by mouth daily before breakfast.      ferrous sulfate 325 (65 FE) MG tablet   Take 1 tablet (325 mg total) by mouth daily.      furosemide 40 MG tablet   Commonly known as: LASIX   Take 40 mg by mouth daily.      metFORMIN 500 MG tablet   Commonly known as: GLUCOPHAGE   Take 1 tablet (500 mg total) by mouth 2 (two) times daily with a meal.      metoprolol tartrate 25 MG tablet   Commonly known as: LOPRESSOR   Take 1 tablet (25 mg total) by mouth 2 (two) times daily.      mulitivitamin with minerals Tabs   Take 1 tablet by mouth daily.      oxyCODONE 5 MG immediate release tablet   Commonly known as: Oxy IR/ROXICODONE   Take 1-2 tablets (5-10 mg total) by mouth every 3 (three) hours as needed for pain.      potassium chloride SA 20 MEQ tablet   Commonly known as: K-DUR,KLOR-CON   Take 2 tablets (40 mEq total) by mouth daily.      rosuvastatin 10 MG tablet   Commonly known as: CRESTOR   Take 1 tablet (10 mg total) by mouth daily.      sulfamethoxazole-trimethoprim 400-80 MG per tablet   Commonly known as: BACTRIM,SEPTRA   Take 1 tablet by mouth every 12 (twelve) hours. X 1 week      Tamsulosin HCl 0.4 MG Caps   Commonly known as: FLOMAX   Take 0.4 mg by mouth daily after breakfast.            Discharge Instructions:  The patient is to refrain from driving, heavy lifting or strenuous activity.  May shower daily and clean incisions with soap and water.  May resume regular diet.  Discharge Orders     Future Appointments: Provider: Department: Dept Phone: Center:   12/23/2011 2:00 PM [facility] [phone] [facility]      Follow-up Information    Follow up with Corky Crafts., MD. Schedule an appointment as soon as possible for a visit in 2 weeks.   Contact information:   [address] [place] [address] [phone]       Follow up with Alleen Borne, MD on 12/23/2011. (Have a chest x-ray at 1:00, then see Dr. Sharee Pimple PA at 2:00)  Contact information:   [address] [place] [address] [phone]       Follow up with Jetta Lout, NP on 12/17/2011. (2:15 to remove Foley catheter)    Contact information:   [address] [facility] [address] [facility] [place] [address] [phone]           Adella Hare 12/03/2011, 10:48 AM

## 2011-12-03 NOTE — Progress Notes (Signed)
14 French Foley catheter inserted per hospital protocol. Patient tolerated insertion well. Patient verbalized relief and no longer having feelings of discomfort. Leg strap applied to secure catheter in place. 600 ml of urine drained into standard drainage bag. Will continue to monitor.

## 2011-12-03 NOTE — Progress Notes (Addendum)
                    [address]            [place] [address]          [phone]     7 Days Post-Op Procedure(s) (LRB): CORONARY ARTERY BYPASS GRAFTING (CABG) (N/A)  Subjective: Pt unable to void after Foley d/c'ed yesterday, therefore Foley replaced.  600 ml urine returned.  Feels much better after Foley reinserted.  Objective: Vital signs in last 24 hours: Patient Vitals for the past 24 hrs:  BP Temp Temp src Pulse Resp SpO2 Weight  12/03/11 0500 113/56 mmHg 97.6 F (36.4 C) Oral 56  20  97 % 82.5 kg (181 lb 14.1 oz)  12/02/11 2144 128/58 mmHg 97.2 F (36.2 C) Oral 68  20  94 % -  12/02/11 1400 106/48 mmHg 98.7 F (37.1 C) Oral 59  17  97 % -  12/02/11 0939 146/59 mmHg - - 70  - - -   Current Weight  12/03/11 82.5 kg (181 lb 14.1 oz)   Pre-op wt=82 kg  Intake/Output from previous day: 02/04 0701 - 02/05 0700 In: 723 [P.O.:720; I.V.:3] Out: 1132 [Urine:1130; Stool:2]  CBGs 95-152-103-93  PHYSICAL EXAM:  Heart: RRR Lungs: clear Wound: clean and dry Extremities: +LE edema  Lab Results: CBC: Basename 12/01/11 0530  WBC 12.5*  HGB 8.4*  HCT 24.7*  PLT 222   BMET: No results found for this basename: NA:2,K:2,CL:2,CO2:2,GLUCOSE:2,BUN:2,CREATININE:2,CALCIUM:2 in the last 72 hours  PT/INR: No results found for this basename: LABPROT,INR in the last 72 hours   Assessment/Plan: S/P Procedure(s) (LRB): CORONARY ARTERY BYPASS GRAFTING (CABG) (N/A) Urinary retention- Foley now replaced, on Flomax.  Pt has seen Dr. Patsi Sears in the past for BPH.  Will leave Foley and he can f/u with GU as an outpatient. Vol overload- continue diuresis. DM- stable on home meds. CV- stable. Disp- SNF, possibly today.  Will d/w MD.   LOS: 15 days    COLLINS,GINA H 12/03/2011    Chart reviewed, patient examined, agree with above. He is doing well but has urinary retention and hx of BPH followed by Dr. Patsi Sears.  I think he can go to SNF with foley and follow  up with urology as an outpatient.

## 2011-12-03 NOTE — Progress Notes (Signed)
Pt d/c to [facility] today. CSW gave chart copy to pt daughter, who will carry pt to facility by car. Clinical Social Worker signing off.  Baxter Flattery, MSW [phone]

## 2011-12-03 NOTE — Progress Notes (Signed)
   CARE MANAGEMENT NOTE 12/03/2011  Patient:  Ryan Pacheco, Ryan Pacheco   Account Number:  [id number]  Date Initiated:  11/25/2011  Documentation initiated by:  GRAVES-BIGELOW,BRENDA  Subjective/Objective Assessment:   Pt admitted with cp. Per  MD notes plan for CABG 11-26-11.  Lives at home alone.     Action/Plan:   PTA, PT INDEPENDENT OF ADLS; LIVES ALONE.  PT HAS SUPPORTIVE GROWN CHILDREN.   Anticipated DC Date:  12/02/2011   Anticipated DC Plan:  HOME W HOME HEALTH SERVICES  In-house referral  Clinical Social Worker      DC Planning Services  CM consult      Choice offered to / List presented to:             Status of service:  Completed, signed off Medicare Important Message given?   (If response is "NO", the following Medicare IM given date fields will be blank) Date Medicare IM given:   Date Additional Medicare IM given:    Discharge Disposition:  SKILLED NURSING FACILITY  Per UR Regulation:  Reviewed for med. necessity/level of care/duration of stay  Comments:  12/03/11 Ryan Reader,Ryan Pacheco,Ryan Pacheco 1145 PT FOR DC TO SNF TODAY, PER CSW ARRANGEMENTS. Phone #[phone]   11/28/11 Ryan Hilgers,Ryan Pacheco,Ryan Pacheco 1530 MET WITH PT TO DISCUSS DC PLANS.  PT STATES CHILDREN WORK, AND CANNOT PROVIDE 24HR CARE AT DISCHARGE.  HE IS AGREEABLE TO SHORT TERM SNF FOR REHAB AT DISCHARGE.  WILL CONSULT CSW TO FACILITATE DISCHARGE TO SNF WHEN MEDICALLY STABLE. Phone #[phone]  11-26-11 11:45am Ryan Pacheco - [phone] UR Completed.  11-25-11 1538 Ryan Pacheco, Ryan Pacheco [phone] CM will continue to monitor for d/c disposition.

## 2011-12-03 NOTE — Progress Notes (Signed)
Cardiac Rehab [phone] Education completed with pt and daughter. Permission given to refer to [facility]. Efton Thomley DunlapRN

## 2011-12-04 DIAGNOSIS — E119 Type 2 diabetes mellitus without complications: Secondary | ICD-10-CM | POA: Diagnosis not present

## 2011-12-04 DIAGNOSIS — N400 Enlarged prostate without lower urinary tract symptoms: Secondary | ICD-10-CM | POA: Diagnosis not present

## 2011-12-04 DIAGNOSIS — I219 Acute myocardial infarction, unspecified: Secondary | ICD-10-CM | POA: Diagnosis not present

## 2011-12-04 DIAGNOSIS — D62 Acute posthemorrhagic anemia: Secondary | ICD-10-CM | POA: Diagnosis not present

## 2011-12-17 ENCOUNTER — Other Ambulatory Visit: Payer: Self-pay | Admitting: Surgery

## 2011-12-17 DIAGNOSIS — R339 Retention of urine, unspecified: Secondary | ICD-10-CM | POA: Diagnosis not present

## 2011-12-17 DIAGNOSIS — N401 Enlarged prostate with lower urinary tract symptoms: Secondary | ICD-10-CM | POA: Diagnosis not present

## 2011-12-17 DIAGNOSIS — I2510 Atherosclerotic heart disease of native coronary artery without angina pectoris: Secondary | ICD-10-CM

## 2011-12-23 ENCOUNTER — Ambulatory Visit
Admission: RE | Admit: 2011-12-23 | Discharge: 2011-12-23 | Disposition: A | Discharge status: Home / Self Care | Payer: Medicare Other | Source: Ambulatory Visit | Attending: Surgery | Admitting: Surgery

## 2011-12-23 ENCOUNTER — Ambulatory Visit (INDEPENDENT_AMBULATORY_CARE_PROVIDER_SITE_OTHER): Payer: Self-pay | Admitting: Physician Assistant

## 2011-12-23 VITALS — BP 119/69 | HR 65 | Resp 18 | Ht 68.0 in | Wt 170.0 lb

## 2011-12-23 DIAGNOSIS — I2510 Atherosclerotic heart disease of native coronary artery without angina pectoris: Secondary | ICD-10-CM

## 2011-12-23 DIAGNOSIS — Z951 Presence of aortocoronary bypass graft: Secondary | ICD-10-CM

## 2011-12-23 NOTE — Progress Notes (Signed)
[address]            [place] [address]          [phone]     HPI: Patient returns for routine postoperative follow-up having undergone coronary artery bypass grafting x4 on 11/26/2011 by Dr. Laneta Simmers. The patient's postoperative course was notable for a brief episode of atrial fibrillation, and bradycardia which remained stable and asymptomatic. He also had urinary retention with a history of BPH, and was discharged to a skilled nursing facility with a Foley catheter in place.  Since hospital discharge the patient has overall progressed well. He returned to [facility] for a voiding trial as an outpatient but continued to have urinary retention and a Foley catheter was replaced. He has additional followup scheduled with their office next week. He has an appointment tomorrow with Dr. Eldridge Dace.  He continues to work with physical therapy at the nursing facility, and although he is still weak and fatigues easily, he is slowly improving with mobility. He states that he gets a little short winded with activity but this is improving as well. He denies any chest discomfort or lower extremity edema.   Current Outpatient Prescriptions  Medication Sig Dispense Refill  . aspirin 325 MG tablet Take 1 tablet (325 mg total) by mouth daily.      Marland Kitchen esomeprazole (NEXIUM) 40 MG capsule Take 40 mg by mouth daily before breakfast.      . ferrous sulfate 325 (65 FE) MG tablet Take 1 tablet (325 mg total) by mouth daily.      . furosemide (LASIX) 40 MG tablet Take 40 mg by mouth daily.      . metFORMIN (GLUCOPHAGE) 500 MG tablet Take 1 tablet (500 mg total) by mouth 2 (two) times daily with a meal.      . metoprolol tartrate (LOPRESSOR) 25 MG tablet Take 1 tablet (25 mg total) by mouth 2 (two) times daily.      . Multiple Vitamin (MULITIVITAMIN WITH MINERALS) TABS Take 1 tablet by mouth daily.      Marland Kitchen oxycodone (OXY-IR) 5 MG capsule Take 5 mg by mouth every 4 (four)  hours as needed.      Marland Kitchen oxyCODONE (OXYCONTIN) 10 MG 12 hr tablet Take 10 mg by mouth every 12 (twelve) hours as needed.      . potassium chloride SA (K-DUR,KLOR-CON) 20 MEQ tablet Take 2 tablets (40 mEq total) by mouth daily.      . rosuvastatin (CRESTOR) 10 MG tablet Take 1 tablet (10 mg total) by mouth daily.      . Tamsulosin HCl (FLOMAX) 0.4 MG CAPS Take 0.4 mg by mouth daily after breakfast.        Physical Exam: BP 119/69 HR 65 Resp 18 Wounds:  Clean and dry, mild erythema of RLE EVH site, but he has a Foley leg bag in place over the area, which appears to be creating friction Heart: RRR, no murmurs, rubs or gallops Lungs: clear Extremities: mild bilateral LE edema   Diagnostic Tests: Chest xray: no effusions or atelectasis   Assessment/Plan: The patient is overall progressing well status post CABG. He continues to have problems with urinary retention which is being followed by Dr. Imelda Pillow office. He remains somewhat weak and deconditioned but overall looks to be progressing well for his age. He has cardiology followup scheduled within the next 24 hours.  His family hopes that he will be discharged from the skilled nursing facility within the next 2 weeks. We will continue his current regimen and we will plan to see him back in 3-4 weeks for a recheck or sooner if he develops any problems.

## 2011-12-24 DIAGNOSIS — I2109 ST elevation (STEMI) myocardial infarction involving other coronary artery of anterior wall: Secondary | ICD-10-CM | POA: Diagnosis not present

## 2011-12-24 DIAGNOSIS — E7800 Pure hypercholesterolemia, unspecified: Secondary | ICD-10-CM | POA: Diagnosis not present

## 2011-12-24 DIAGNOSIS — I2510 Atherosclerotic heart disease of native coronary artery without angina pectoris: Secondary | ICD-10-CM | POA: Diagnosis not present

## 2011-12-25 DIAGNOSIS — R339 Retention of urine, unspecified: Secondary | ICD-10-CM | POA: Diagnosis not present

## 2011-12-25 DIAGNOSIS — N401 Enlarged prostate with lower urinary tract symptoms: Secondary | ICD-10-CM | POA: Diagnosis not present

## 2012-01-01 ENCOUNTER — Other Ambulatory Visit: Payer: Self-pay | Admitting: Urology

## 2012-01-01 DIAGNOSIS — N401 Enlarged prostate with lower urinary tract symptoms: Secondary | ICD-10-CM | POA: Diagnosis not present

## 2012-01-01 DIAGNOSIS — R972 Elevated prostate specific antigen [PSA]: Secondary | ICD-10-CM | POA: Diagnosis not present

## 2012-01-01 DIAGNOSIS — R339 Retention of urine, unspecified: Secondary | ICD-10-CM | POA: Diagnosis not present

## 2012-01-06 ENCOUNTER — Encounter (HOSPITAL_COMMUNITY): Payer: Self-pay | Admitting: Pharmacy Technician

## 2012-01-09 ENCOUNTER — Other Ambulatory Visit (HOSPITAL_COMMUNITY): Payer: Medicare Other

## 2012-01-09 ENCOUNTER — Encounter (HOSPITAL_COMMUNITY)
Admission: RE | Admit: 2012-01-09 | Discharge: 2012-01-09 | Disposition: A | Discharge status: Home / Self Care | Payer: Medicare Other | Source: Ambulatory Visit | Attending: Urology | Admitting: Urology

## 2012-01-09 ENCOUNTER — Encounter (HOSPITAL_COMMUNITY): Payer: Self-pay

## 2012-01-09 DIAGNOSIS — Z01812 Encounter for preprocedural laboratory examination: Secondary | ICD-10-CM | POA: Diagnosis not present

## 2012-01-09 DIAGNOSIS — R339 Retention of urine, unspecified: Secondary | ICD-10-CM | POA: Diagnosis not present

## 2012-01-09 DIAGNOSIS — N401 Enlarged prostate with lower urinary tract symptoms: Secondary | ICD-10-CM | POA: Diagnosis not present

## 2012-01-09 DIAGNOSIS — Z86718 Personal history of other venous thrombosis and embolism: Secondary | ICD-10-CM | POA: Diagnosis not present

## 2012-01-09 DIAGNOSIS — I252 Old myocardial infarction: Secondary | ICD-10-CM | POA: Diagnosis not present

## 2012-01-09 DIAGNOSIS — N400 Enlarged prostate without lower urinary tract symptoms: Secondary | ICD-10-CM

## 2012-01-09 DIAGNOSIS — R972 Elevated prostate specific antigen [PSA]: Secondary | ICD-10-CM | POA: Diagnosis not present

## 2012-01-09 DIAGNOSIS — Z951 Presence of aortocoronary bypass graft: Secondary | ICD-10-CM | POA: Diagnosis not present

## 2012-01-09 DIAGNOSIS — Z7982 Long term (current) use of aspirin: Secondary | ICD-10-CM | POA: Diagnosis not present

## 2012-01-09 DIAGNOSIS — Z79899 Other long term (current) drug therapy: Secondary | ICD-10-CM | POA: Diagnosis not present

## 2012-01-09 DIAGNOSIS — M1990 Unspecified osteoarthritis, unspecified site: Secondary | ICD-10-CM

## 2012-01-09 DIAGNOSIS — K219 Gastro-esophageal reflux disease without esophagitis: Secondary | ICD-10-CM | POA: Diagnosis not present

## 2012-01-09 HISTORY — DX: Benign prostatic hyperplasia without lower urinary tract symptoms: N40.0

## 2012-01-09 HISTORY — PX: CARDIAC CATHETERIZATION: SHX172

## 2012-01-09 HISTORY — DX: Unspecified osteoarthritis, unspecified site: M19.90

## 2012-01-09 LAB — CBC
Hemoglobin: 12.6 g/dL — ABNORMAL LOW (ref 13.0–17.0)
MCH: 28.4 pg (ref 26.0–34.0)
MCHC: 31.9 g/dL (ref 30.0–36.0)
MCV: 89 fL (ref 78.0–100.0)
RBC: 4.44 MIL/uL (ref 4.22–5.81)
WBC: 10.1 10*3/uL (ref 4.0–10.5)

## 2012-01-09 LAB — SURGICAL PCR SCREEN: MRSA, PCR: POSITIVE — AB

## 2012-01-09 LAB — BASIC METABOLIC PANEL
Calcium: 9.7 mg/dL (ref 8.4–10.5)
Chloride: 101 mEq/L (ref 96–112)
Creatinine, Ser: 1.05 mg/dL (ref 0.50–1.35)
GFR calc Af Amer: 70 mL/min — ABNORMAL LOW (ref >90)
Sodium: 140 mEq/L (ref 135–145)

## 2012-01-09 NOTE — Patient Instructions (Signed)
20 Ryan Pacheco  01/09/2012   Your procedure is scheduled on: 3-18  -2013  Report to [facility] at  0630      AM.  Call this number if you have problems the morning of surgery: [phone]   Remember:   Do not eat food:After Midnight.  .  Take these medicines the morning of surgery with A SIP OF WATER: Metoprolol, Nexium, Proscar, Tamsulosin. Use usual eye drops   Do not wear jewelry, make-up or nail polish.  Do not wear lotions, powders, or perfumes. You may wear deodorant.  Do not shave 48 hours prior to surgery.(face and neck okay, no shaving of legs)  Do not bring valuables to the hospital.  Contacts, dentures or bridgework may not be worn into surgery.  Leave suitcase in the car. After surgery it may be brought to your room.  For patients admitted to the hospital, checkout time is 11:00 AM the day of discharge.   Patients discharged the day of surgery will not be allowed to drive home.  Name and phone number of your driver: family Special Instructions: CHG Shower Use Special Wash: 1/2 bottle night before surgery and 1/2 bottle morning of surgery.(avoid face and genitals)   Please read over the following fact sheets that you were given: MRSA Information.

## 2012-01-09 NOTE — Pre-Procedure Instructions (Signed)
01-09-12 EKG(11-27-11), CXR (12-23-11)-reports with chart.

## 2012-01-12 NOTE — H&P (Signed)
Chief Complaint  cc: Dr. Elias Else   Reason For Visit  Discuss long term foley vs s/p tube   Active Problems Problems  1. Acute Urinary Retention 788.20 2. Benign Prostatic Hypertrophy With Urinary Obstruction 600.01 3. PSA,Elevated 790.93  History of Present Illness       [name] returns today to discuss long term foley vs s/p tube.  He has failed multiple voiding trials.  Patient had a prostate u/s on 12/25/11 that showed a 78.64gr prostate.  He was last seen by Jetta Lout, NP on 12/25/11 as an attempted voiding trail. Has NSTEMI and developed AUR and foley was placed. Recommendation was to discharge patient with foley and on Tamuslosin 0.4 mg 1 po daily. Added Finasteride 5 mg 1 po daily and was to have SNF attempt 3rd voiding trail.  [name] family states that SNF did attempt to remove foley but when foley removed he would void between 100-150 ml with PVR >200 ml. SNF replaced foley. He is now ambulatory with leg bag. Recommendation at previous visit was to continue with Tamsulosin 0.4 mg 1 po daily and to add Finasteride 5 mg 1 po daily (unsure if he is taking Finasteride).   Past Medical History Problems  1. History of  Acute Myocardial Infarction V12.59 2. History of  Arthritis V13.4 3. History of  Esophageal Reflux 530.81 4. History of  Thrombophlebitis Of Deep Vessels Of The Lower Extremity V12.51 5. History of  Venous Thrombosis Of The Deep Vessels Of The Distal Lower Extremity 453.42  Surgical History Problems  1. History of  CABG (CABG) V45.81 2. History of  Cholecystectomy  Current Meds 1. Aspirin 325 MG Oral Tablet; Therapy: (Recorded:19Feb2013) to 2. Bactrim 400-80 MG Oral Tablet; Therapy: (Recorded:19Feb2013) to 3. Crestor 10 MG Oral Tablet; Therapy: (Recorded:19Feb2013) to 4. Ferrous Sulfate 325 MG CAPS; Therapy: (Recorded:19Feb2013) to 5. Flomax 0.4 MG CP24; Therapy: (Recorded:13Apr2009) to 6. Lasix 40 MG Oral Tablet; Therapy:  (Recorded:19Feb2013) to 7. Lopressor TABS; 25mg ; Therapy: (Recorded:19Feb2013) to 8. Multi-Vitamin Oral Tablet; Therapy: (Recorded:19Feb2013) to 9. Omeprazole 40 MG Oral Capsule Delayed Release; Therapy: (Recorded:19Feb2013) to 10. OxyCODONE HCl 10 MG Oral Tablet; Therapy: (Recorded:19Feb2013) to 11. OxyCODONE HCl 15 MG Oral Tablet; Therapy: (Recorded:19Feb2013) to 12. OxyCODONE HCl 5 MG Oral Tablet; Therapy: (Recorded:19Feb2013) to 13. Pataday 0.2 % Ophthalmic Solution; Therapy: (Recorded:19Feb2013) to 14. Potassium Chloride TBCR; 40 meq; Therapy: (Recorded:19Feb2013) to  Allergies Medication  1. No Known Drug Allergies  Family History Problems  1. Paternal history of  Death In The Family Father father passed @ age 64heart attack 2. Maternal history of  Death In The Family Mother mother passed @ age 54old age 37. Family history of  Family Health Status Number Of Children 1 son1 daughter 4. Paternal history of  Heart Disease V17.49  Social History Problems  1. Caffeine Use 2. Marital History - Widowed 3. Never A Smoker 4. Occupation: Retired Nurse, children's  5. History of  Alcohol Use  Review of Systems Constitutional, skin, eye, otolaryngeal, hematologic/lymphatic, cardiovascular, pulmonary, endocrine, musculoskeletal, gastrointestinal, neurological and psychiatric system(s) were reviewed and pertinent findings if present are noted.  Genitourinary: incomplete emptying of bladder.    Vitals Vital Signs [Data Includes: Last 1 Day]  06Mar2013 09:54AM  Blood Pressure: 124 / 73 Temperature: 97 F Heart Rate: 61  Physical Exam Constitutional: Well nourished and well developed . No acute distress.  ENT:. The ears and nose are normal in appearance.  Neck: The appearance of the neck is normal and  no neck mass is present.  Abdomen: The abdomen is flat. The abdomen is soft and nontender. No masses are palpated. No CVA tenderness. No hernias are palpable. No hepatosplenomegaly noted.    Rectal: Rectal exam demonstrates absent sphincter tone. Estimated prostate size is 4+. The prostate is not indurated.  Genitourinary: Examination of the penis demonstrates no discharge, no masses, no lesions and a normal meatus. The scrotum is without lesions. The right epididymis is palpably normal and non-tender. The left epididymis is palpably normal and non-tender. The right testis is non-tender and without masses. The left testis is non-tender and without masses.  Lymphatics: The femoral and inguinal nodes are not enlarged or tender.  Skin: Normal skin turgor, no visible rash and no visible skin lesions.  Neuro/Psych:. Mood and affect are appropriate.    Assessment Assessed  1. Acute Urinary Retention 788.20 2. Benign Prostatic Hypertrophy With Urinary Obstruction 600.01 3. PSA,Elevated 790.93   78 yo male. ( 40 minute face to face discussion and phone call to [facility]).  Discussion with pt and his daughter. She has not given up on his ability to void. He has foley cath in place now, and is able to care for it. He has failed multiple voiding trials. He is able to care for his catheter himself. He is unable to do I/o cath, however.    We have agreed to pursue s-p tube placement and then use cath plug and bag interchangeably, to be changed by home health each month.   Plan Acute Urinary Retention (788.20), Benign Prostatic Hypertrophy With Urinary Obstruction (600.01), PSA,Elevated (790.93)  1. Follow-up Schedule Surgery Office  Follow-up  Requested for: 06Mar2013   Arrange s-p tube, with plug and bag, and then HHN to change q months.   Signatures Electronically signed by : Ryan Pacheco, M.D.; Jan 01 2012 10:44AM

## 2012-01-13 ENCOUNTER — Encounter (HOSPITAL_COMMUNITY): Payer: Self-pay | Admitting: Anesthesiology

## 2012-01-13 ENCOUNTER — Ambulatory Visit (HOSPITAL_COMMUNITY)
Admission: RE | Admit: 2012-01-13 | Discharge: 2012-01-13 | Disposition: A | Discharge status: Home Health | Payer: Medicare Other | Source: Ambulatory Visit | Attending: Urology | Admitting: Urology

## 2012-01-13 ENCOUNTER — Encounter (HOSPITAL_COMMUNITY): Payer: Self-pay

## 2012-01-13 ENCOUNTER — Ambulatory Visit (HOSPITAL_COMMUNITY): Payer: Medicare Other | Admitting: Anesthesiology

## 2012-01-13 ENCOUNTER — Encounter (HOSPITAL_COMMUNITY)
Admission: RE | Disposition: A | Discharge status: Home Health | Payer: Self-pay | Source: Ambulatory Visit | Attending: Urology

## 2012-01-13 DIAGNOSIS — I252 Old myocardial infarction: Secondary | ICD-10-CM | POA: Diagnosis not present

## 2012-01-13 DIAGNOSIS — Z951 Presence of aortocoronary bypass graft: Secondary | ICD-10-CM | POA: Insufficient documentation

## 2012-01-13 DIAGNOSIS — N401 Enlarged prostate with lower urinary tract symptoms: Secondary | ICD-10-CM | POA: Insufficient documentation

## 2012-01-13 DIAGNOSIS — R972 Elevated prostate specific antigen [PSA]: Secondary | ICD-10-CM | POA: Insufficient documentation

## 2012-01-13 DIAGNOSIS — Z86718 Personal history of other venous thrombosis and embolism: Secondary | ICD-10-CM | POA: Diagnosis not present

## 2012-01-13 DIAGNOSIS — Z79899 Other long term (current) drug therapy: Secondary | ICD-10-CM | POA: Insufficient documentation

## 2012-01-13 DIAGNOSIS — R339 Retention of urine, unspecified: Secondary | ICD-10-CM | POA: Diagnosis not present

## 2012-01-13 DIAGNOSIS — K219 Gastro-esophageal reflux disease without esophagitis: Secondary | ICD-10-CM | POA: Insufficient documentation

## 2012-01-13 DIAGNOSIS — Z7982 Long term (current) use of aspirin: Secondary | ICD-10-CM | POA: Insufficient documentation

## 2012-01-13 DIAGNOSIS — Z01812 Encounter for preprocedural laboratory examination: Secondary | ICD-10-CM | POA: Insufficient documentation

## 2012-01-13 DIAGNOSIS — N138 Other obstructive and reflux uropathy: Secondary | ICD-10-CM | POA: Insufficient documentation

## 2012-01-13 LAB — GLUCOSE, CAPILLARY
Glucose-Capillary: 109 mg/dL — ABNORMAL HIGH (ref 70–99)
Glucose-Capillary: 106 mg/dL — ABNORMAL HIGH (ref 70–99)

## 2012-01-13 SURGERY — INSERTION, SUPRAPUBIC CATHETER
Anesthesia: General | Wound class: Clean Contaminated

## 2012-01-13 MED ORDER — CEFAZOLIN SODIUM 1-5 GM-% IV SOLN
INTRAVENOUS | Status: DC | PRN
  Administered 2012-01-13: 1 g via INTRAVENOUS

## 2012-01-13 MED ORDER — PROMETHAZINE HCL 25 MG/ML IJ SOLN: 6.2500 mg | INTRAMUSCULAR | Status: DC | PRN

## 2012-01-13 MED ORDER — ACETAMINOPHEN 10 MG/ML IV SOLN
INTRAVENOUS | Status: DC | PRN
  Administered 2012-01-13: 1000 mg via INTRAVENOUS

## 2012-01-13 MED ORDER — METOCLOPRAMIDE HCL 5 MG/ML IJ SOLN
INTRAMUSCULAR | Status: DC | PRN
  Administered 2012-01-13: 10 mg via INTRAVENOUS

## 2012-01-13 MED ORDER — LIDOCAINE HCL (CARDIAC) 20 MG/ML IV SOLN
INTRAVENOUS | Status: DC | PRN
  Administered 2012-01-13: 50 mg via INTRAVENOUS

## 2012-01-13 MED ORDER — CEFAZOLIN SODIUM 1-5 GM-% IV SOLN
INTRAVENOUS | Status: AC
  Filled 2012-01-13: qty 50

## 2012-01-13 MED ORDER — LACTATED RINGERS IV SOLN
INTRAVENOUS | Status: DC
  Administered 2012-01-13: 1000 mL via INTRAVENOUS

## 2012-01-13 MED ORDER — HYDROCODONE-ACETAMINOPHEN 7.5-650 MG PO TABS: 1.0000 | ORAL_TABLET | Freq: Four times a day (QID) | ORAL | Status: AC | PRN

## 2012-01-13 MED ORDER — ACETAMINOPHEN 10 MG/ML IV SOLN
INTRAVENOUS | Status: AC
  Filled 2012-01-13: qty 100

## 2012-01-13 MED ORDER — PHENYLEPHRINE HCL 10 MG/ML IJ SOLN
INTRAMUSCULAR | Status: DC | PRN
  Administered 2012-01-13: 40 ug via INTRAVENOUS

## 2012-01-13 MED ORDER — ONDANSETRON HCL 4 MG/2ML IJ SOLN
INTRAMUSCULAR | Status: DC | PRN
  Administered 2012-01-13: 4 mg via INTRAVENOUS

## 2012-01-13 MED ORDER — FENTANYL CITRATE 0.05 MG/ML IJ SOLN
INTRAMUSCULAR | Status: DC | PRN
  Administered 2012-01-13 (×6): 25 ug via INTRAVENOUS

## 2012-01-13 MED ORDER — URELLE 81 MG PO TABS: 1.0000 | ORAL_TABLET | Freq: Three times a day (TID) | ORAL | Status: DC

## 2012-01-13 MED ORDER — SODIUM CHLORIDE 0.9 % IR SOLN
Status: DC | PRN
  Administered 2012-01-13: 3000 mL

## 2012-01-13 MED ORDER — PROPOFOL 10 MG/ML IV EMUL
INTRAVENOUS | Status: DC | PRN
  Administered 2012-01-13: 100 ug/kg/min via INTRAVENOUS

## 2012-01-13 MED ORDER — FENTANYL CITRATE 0.05 MG/ML IJ SOLN: 25.0000 ug | INTRAMUSCULAR | Status: DC | PRN

## 2012-01-13 MED ORDER — LACTATED RINGERS IV SOLN
INTRAVENOUS | Status: DC | PRN
  Administered 2012-01-13: 09:00:00 via INTRAVENOUS

## 2012-01-13 MED ORDER — BUPIVACAINE HCL (PF) 0.5 % IJ SOLN
INTRAMUSCULAR | Status: DC | PRN
  Administered 2012-01-13: 10 mL

## 2012-01-13 MED ORDER — CEFAZOLIN SODIUM 1-5 GM-% IV SOLN: 1.0000 g | INTRAVENOUS | Status: DC

## 2012-01-13 MED ORDER — KETAMINE HCL 50 MG/ML IJ SOLN
INTRAMUSCULAR | Status: DC | PRN
  Administered 2012-01-13: 4 mg via INTRAMUSCULAR
  Administered 2012-01-13: 2 mg via INTRAMUSCULAR
  Administered 2012-01-13: 4 mg via INTRAMUSCULAR
  Administered 2012-01-13 (×11): 2 mg via INTRAMUSCULAR
  Administered 2012-01-13 (×2): 4 mg via INTRAMUSCULAR

## 2012-01-13 MED ORDER — BUPIVACAINE HCL (PF) 0.5 % IJ SOLN
INTRAMUSCULAR | Status: AC
  Filled 2012-01-13: qty 30

## 2012-01-13 SURGICAL SUPPLY — 22 items
BAG URINE DRAINAGE (UROLOGICAL SUPPLIES) ×2 IMPLANT
BAG URINE LEG 500ML (DRAIN) ×2 IMPLANT
BLADE SURG 15 STRL LF DISP TIS (BLADE) ×1 IMPLANT
BLADE SURG 15 STRL SS (BLADE) ×2
CATH SILASTIC FOLEY 18FRX30CC (CATHETERS) ×1 IMPLANT
CLOTH BEACON ORANGE TIMEOUT ST (SAFETY) ×3 IMPLANT
COVER SURGICAL LIGHT HANDLE (MISCELLANEOUS) ×3 IMPLANT
ELECT REM PT RETURN 9FT ADLT (ELECTROSURGICAL) ×2
ELECTRODE REM PT RTRN 9FT ADLT (ELECTROSURGICAL) ×1 IMPLANT
GLOVE SURG SS PI 8.0 STRL IVOR (GLOVE) ×2 IMPLANT
GOWN STRL REIN XL XLG (GOWN DISPOSABLE) ×4 IMPLANT
KIT SUPRAPUBIC CATH (MISCELLANEOUS) ×1 IMPLANT
MANIFOLD NEPTUNE II (INSTRUMENTS) ×2 IMPLANT
NEEDLE HYPO 22GX1.5 SAFETY (NEEDLE) IMPLANT
NS IRRIG 1000ML POUR BTL (IV SOLUTION) ×2 IMPLANT
PACK CYSTO (CUSTOM PROCEDURE TRAY) ×2 IMPLANT
PENCIL BUTTON HOLSTER BLD 10FT (ELECTRODE) IMPLANT
PLUG CATH AND CAP STER (CATHETERS) IMPLANT
SUT ETHILON 3 0 PS 1 (SUTURE) ×2 IMPLANT
SYR 30ML LL (SYRINGE) ×1 IMPLANT
TOWEL OR 17X26 10 PK STRL BLUE (TOWEL DISPOSABLE) ×2 IMPLANT
WATER STERILE IRR 3000ML UROMA (IV SOLUTION) ×2 IMPLANT

## 2012-01-13 NOTE — Anesthesia Postprocedure Evaluation (Signed)
  Anesthesia Post-op Note  Patient: Ryan Pacheco  Procedure(s) Performed: Procedure(s) (LRB): INSERTION OF SUPRAPUBIC CATHETER (N/A)  Patient Location: PACU  Anesthesia Type: MAC  Level of Consciousness: awake and alert   Airway and Oxygen Therapy: Patient Spontanous Breathing  Post-op Pain: mild  Post-op Assessment: Post-op Vital signs reviewed, Patient's Cardiovascular Status Stable, Respiratory Function Stable, Patent Airway and No signs of Nausea or vomiting  Post-op Vital Signs: stable  Complications: No apparent anesthesia complications

## 2012-01-13 NOTE — Transfer of Care (Signed)
Immediate Anesthesia Transfer of Care Note  Patient: Ryan Pacheco  Procedure(s) Performed: Procedure(s) (LRB): INSERTION OF SUPRAPUBIC CATHETER (N/A)  Patient Location: PACU  Anesthesia Type: MAC  Level of Consciousness: oriented, sedated and responds to stimulation  Airway & Oxygen Therapy: Patient Spontanous Breathing and Patient connected to face mask oxygen  Post-op Assessment: Report given to PACU RN, Post -op Vital signs reviewed and stable and Patient moving all extremities  Post vital signs: Reviewed and stable  Complications: No apparent anesthesia complications

## 2012-01-13 NOTE — Discharge Instructions (Signed)
Cystoscopy (Bladder Exam) Care After Refer to this sheet in the next few weeks. These discharge instructions provide you with general information on caring for yourself after you leave the hospital. Your caregiver may also give you specific instructions. Your treatment has been planned according to the most current medical practices available, but unavoidable complications sometimes occur. If you have any problems or questions after discharge, please call your caregiver. AFTER THE PROCEDURE   There may be temporary bleeding and burning with urination.   Drink enough water and fluids to keep your urine clear or pale yellow.  FINDING OUT THE RESULTS OF YOUR TEST Not all test results are available during your visit. If your test results are not back during the visit, make an appointment with your caregiver to find out the results. Do not assume everything is normal if you have not heard from your caregiver or the medical facility. It is important for you to follow up on all of your test results. SEEK IMMEDIATE MEDICAL CARE IF:   There is an increase in blood in the urine or you are passing clots.   There is difficulty passing urine.   You develop the chills.   You have an oral temperature above 102 F (38.9 C), not controlled by medicine.   Belly (abdominal) pain develops.  Document Released: 05/03/2005 Document Revised: 10/03/2011 Document Reviewed: 02/29/2008 ExitCare Patient Information 2012 ExitCare, LLCSuprapubic Catheter Home Guide A suprapubic catheter is a rubber tube with a tiny balloon on the end. It is used to drain urine from the bladder. This catheter is put in your bladder through a small opening in the lower center part of your abdomen. Suprapubic refers to the area right above your pubic bone. The balloon on the end of the catheter is filled with germ-free (sterile) water. This keeps the catheter from slipping out. When the catheter is in place, your urine will drain into a  collection bag. The bag can be put beside your bed at night or attached to your leg during the day. HOW TO CARE FOR YOUR CATHETER  Cleaning your skin  Clean the skin around the catheter opening every day.   Wash your hands with soap and water.   Clean the skin around the opening with a clean washcloth and soapy water. Do not pull on the tube.   Pat the area dry with a clean towel.   Your caregiver may want you to put a bandage (dressing) over the site. Do not use ointment on this area unless your caregiver tells you to.  Cleaning the catheter  Ask your caregiver if you need to clean the catheter and how often.   Use only soap and water.   There may be crusts on the catheter. Put hydrogen peroxide on a cotton ball or gauze pad to remove any crust.  Emptying the collection bag  You may have a large drainage bag to use at night and a smaller one for daytime. Empty the large bag every 8 hours. Empty the small bag when it is about ? full.   Keep the drainage bag below the level of the catheter. This keeps urine from flowing backwards.   Hold the bag over the toilet or another container. Release the valve (spigot) at the bottom of the bag. Do not touch the opening of the spigot. Do not let the opening touch the toilet or container.   Close the spigot tightly when the bag is empty.  Cleaning the collection bag  Clean the bag every few days.   First, wash your hands.   Disconnect the tubing from the catheter. Replace the used bag with a new bag. Then you can clean the used one.   Empty the used bag completely. Rinse it out with warm water and soap or fill the bag with water and add 1 teaspoon of vinegar. Let it sit for about 30 minutes. Then drain.   The bag should be completely dry before storing it. Put it inside a plastic bag to keep it clean.  Checking everything  Always make sure there are no kinks in the catheter or tubing.   Always make sure there are no leaks in the  catheter, tubing, or collection bag.  HOW TO CHANGE YOUR CATHETER Sometimes, a caregiver will change your suprapubic catheter. Other times, you may need to change it yourself. This may be the case if you need to wear a catheter for a long time. Usually, they need to be changed every 4 to 6 weeks. Ask your caregiver how often yours should be changed. Your caregiver will help you order the following supplies for home delivery:  Sterile gloves.   Catheters.   Syringes.   Sterile water.   Sterile cleaning solution.   Lubricant.   Drainage bags.  Changing your catheter  Drink plenty of fluids before changing the catheter.   Wash your hands with soap and water.   Lie on your back and put on sterile gloves.   Clean the skin around the catheter opening. Use the sterile cleaning solution.   Use a syringe to get the water out of the balloon from the old catheter.   Slowly remove the catheter.   Take off the first pair of gloves, and put on a new pair. Then put lubricant on the tip of the new catheter. Put the new catheter through the opening.   Wait for some urine to start flowing. Then, use the other syringe to fill the balloon with sterile water.   Attach the catheter to your drainage bag. Make sure the connection is tight.  Important warnings  The catheter should come out easily. If it seems stuck, do not pull it.   Call your caregiver right away if you have any trouble while changing the catheter.   When the old catheter is removed, the new one should be put in right away. This is because the opening will close quickly. If you have a problem, go to an emergency clinic right away.  RISKS AND COMPLICATIONS  Urine flow can become blocked. This can happen if the catheter or tubes are not working right. A blood clot can also block urine flow.   The catheter might irritate tissue in your body. This can cause bleeding.   The skin near the opening for the catheter may become  irritated or infected.   Bacteria may get into your bladder. This can cause a urinary tract infection.  HOME CARE INSTRUCTIONS  Take all medicines prescribed by your caregiver. Follow the directions carefully.   Drink 8 glasses of water every day. This produces good urine flow.   Check the skin around your catheter a few times every day. Watch for redness and swelling. Look for any fluids coming out of the opening.   Do not use powder or cream around the catheter opening.   Do not take tub baths or use pools or hot tubs.   Keep all follow-up appointments.  SEEK MEDICAL CARE IF:  You  leak urine.   Your skin around the catheter becomes red or sore.   Your urine flow slows down.   Your urine gets cloudy or smelly.  SEEK IMMEDIATE MEDICAL CARE IF:   You have chills, nausea, or back pain.   You have trouble changing your catheter.   Your catheter comes out.   You have blood in your urine.   You have no urine flow for 1 hour.   You have a fever.  Document Released: 07/02/2011 Document Revised: 10/03/2011 Document Reviewed: 07/02/2011 [facility] Patient Information 2012 [place], [place].Suprapubic Catheter Replacement Care After Refer to this sheet in the next few weeks. These instructions provide you with information on caring for yourself after changing your catheter. Your caregiver may also give you more specific instructions. Call your caregiver if you have any problems or questions after you change your catheter. HOME CARE INSTRUCTIONS   Take all medicines prescribed by your caregiver. Follow the directions carefully.   Drink 8 glasses of water every day. This produces good urine flow.   Check the skin around your catheter a few times every day. Watch for redness and swelling. Look for any fluids coming out of the opening.   Do not use powder or cream around the catheter opening.   Do not take tub baths or use pools or hot tubs.   Keep all follow-up appointments.    SEEK MEDICAL CARE IF:   You leak urine.   Your skin around the catheter becomes red or sore.   Your urine flow slows down.   Your urine gets cloudy or smelly.  SEEK IMMEDIATE MEDICAL CARE IF:   You have chills, nausea, or back pain.   You have trouble changing your catheter.   Your catheter comes out.   You have blood in your urine.   You have no urine flow for 1 hour.   You have a fever.  Document Released: 07/02/2011 Document Revised: 10/03/2011 Document Reviewed: 07/02/2011 [facility] Patient Information 2012 [place], [place].Marland Kitchen

## 2012-01-13 NOTE — Anesthesia Preprocedure Evaluation (Signed)
Anesthesia Evaluation  Patient identified by MRN, date of birth, ID band Patient awake    Reviewed: Allergy & Precautions, H&P , NPO status , Patient's Chart, lab work & pertinent test results  Airway Mallampati: II TM Distance: <3 FB Neck ROM: Full    Dental No notable dental hx.    Pulmonary neg pulmonary ROS,  breath sounds clear to auscultation  Pulmonary exam normal       Cardiovascular + angina + CAD and + Past MI Rhythm:Regular Rate:Normal     Neuro/Psych negative neurological ROS  negative psych ROS   GI/Hepatic Neg liver ROS, GERD-  ,  Endo/Other  negative endocrine ROS  Renal/GU negative Renal ROS  negative genitourinary   Musculoskeletal negative musculoskeletal ROS (+)   Abdominal   Peds negative pediatric ROS (+)  Hematology negative hematology ROS (+)   Anesthesia Other Findings   Reproductive/Obstetrics negative OB ROS                           Anesthesia Physical Anesthesia Plan  ASA: III  Anesthesia Plan: General   Post-op Pain Management:    Induction: Intravenous  Airway Management Planned: LMA  Additional Equipment:   Intra-op Plan:   Post-operative Plan:   Informed Consent: I have reviewed the patients History and Physical, chart, labs and discussed the procedure including the risks, benefits and alternatives for the proposed anesthesia with the patient or authorized representative who has indicated his/her understanding and acceptance.   Dental advisory given  Plan Discussed with: CRNA  Anesthesia Plan Comments:         Anesthesia Quick Evaluation

## 2012-01-13 NOTE — Progress Notes (Signed)
Paged Dr Patsi Sears re: pt & family's questions about [facility]

## 2012-01-13 NOTE — Interval H&P Note (Signed)
History and Physical Interval Note:  01/13/2012 9:04 AM  Ryan Pacheco  has presented today for surgery, with the diagnosis of urinary retention  The various methods of treatment have been discussed with the patient and family. After consideration of risks, benefits and other options for treatment, the patient has consented to  Procedure(s) (LRB): INSERTION OF SUPRAPUBIC CATHETER (N/A) as a surgical intervention .  The patients' history has been reviewed, patient examined, no change in status, stable for surgery.  I have reviewed the patients' chart and labs.  Questions were answered to the patient's satisfaction.     Jethro Bolus I

## 2012-01-13 NOTE — Progress Notes (Signed)
Pt states has a tender spot on backside..sacral area reddened w/o broken skin. Encouraged to  Lay on alternate  sides , not on sacral area

## 2012-01-13 NOTE — Progress Notes (Signed)
Received orders from Dr Patsi Sears for [facility] nurse visit to teach cath care & dressing change prn. Contacted [facility] & faxed orders.

## 2012-01-13 NOTE — Preoperative (Signed)
Beta Blockers   Reason not to administer Beta Blockers:Not Applicable 

## 2012-01-13 NOTE — Op Note (Signed)
Pre-operative diagnosis : Chronic urinary retention  Postoperative diagnosis: Same  Operation: Cystourethroscopy, insertion soon-to-be tub none  Surgeon:  S. Patsi Sears, MD  First assistant: None  Anesthesia:  Local, standby, MAC  Preparation: After appropriate preanesthesia, the patient was brought to the operating room, and placed on the operating table in the dorsal supine position. Ryan Pacheco was rechecked. General IV sedation was then given, the patient was replaced in the dorsal lithotomy position, prepped with Betadine solution, and draped in usual fashion.  Review history: 78 year old male with multiple medical issues, and chronic urinary retention. He has had Foley catheter for an extended period time, and has the region of his glans, now has distal urethral meatal erosion, and hypospadias. He is now for placement of suprapubic tube.  Statement of  Likelihood of Success: Excellent. TIME-OUT observed.:  Procedure: Rigid cystoscopy was accomplished, and photodocumentation of eroded urethral meatus was identified. The pendulous urethra is normal. The proximal urethra shows a normal vera, with bilobar prostate hypertrophy and obstruction. There is gross edema of the prostate itself, bladder neck. The trigone appears normal, and there is clear reflux in both cortices. Trabeculation and early cellule formation is noted. There is no diverticular formation, and no bladder stone or tumor noted.  The patient was placed in the [place] position, with legs elevated. The cystoscope was removed after filling the bladder, and a Lowsley tractor was then placed, which was palpable through the suprapubic abdominal wall. A blue marking pen was used to mark the spot of incision, and 10 cc of 0.5% plain Marcaine was injected into the skin, and subcutaneous tissues. A 15 blade was then used to cut down on the Lowsley tractor, which was then brought into the wound, and an 18 [place] Silastic catheter was then  brought into the urethra. Cystoscopy was then accomplished to follow the retrograde placement of the catheter, with 20 cc water placed in the balloon . Following this, a 3-0 nylon suture was used to anchor the super pubic tube in place, and simultaneously closed the incision. The patient was replaced in the supine position at, out of Trendelenburg. He was then awakened, taken to recovery room in stable condition.

## 2012-01-14 DIAGNOSIS — Z435 Encounter for attention to cystostomy: Secondary | ICD-10-CM | POA: Diagnosis not present

## 2012-01-14 DIAGNOSIS — I252 Old myocardial infarction: Secondary | ICD-10-CM | POA: Diagnosis not present

## 2012-01-14 DIAGNOSIS — R339 Retention of urine, unspecified: Secondary | ICD-10-CM | POA: Diagnosis not present

## 2012-01-14 DIAGNOSIS — Z951 Presence of aortocoronary bypass graft: Secondary | ICD-10-CM | POA: Diagnosis not present

## 2012-01-14 DIAGNOSIS — I2510 Atherosclerotic heart disease of native coronary artery without angina pectoris: Secondary | ICD-10-CM | POA: Diagnosis not present

## 2012-01-14 DIAGNOSIS — E119 Type 2 diabetes mellitus without complications: Secondary | ICD-10-CM | POA: Diagnosis not present

## 2012-01-15 DIAGNOSIS — Z951 Presence of aortocoronary bypass graft: Secondary | ICD-10-CM | POA: Diagnosis not present

## 2012-01-15 DIAGNOSIS — R339 Retention of urine, unspecified: Secondary | ICD-10-CM | POA: Diagnosis not present

## 2012-01-15 DIAGNOSIS — I252 Old myocardial infarction: Secondary | ICD-10-CM | POA: Diagnosis not present

## 2012-01-15 DIAGNOSIS — Z435 Encounter for attention to cystostomy: Secondary | ICD-10-CM | POA: Diagnosis not present

## 2012-01-15 DIAGNOSIS — E119 Type 2 diabetes mellitus without complications: Secondary | ICD-10-CM | POA: Diagnosis not present

## 2012-01-15 DIAGNOSIS — I2510 Atherosclerotic heart disease of native coronary artery without angina pectoris: Secondary | ICD-10-CM | POA: Diagnosis not present

## 2012-01-16 DIAGNOSIS — E119 Type 2 diabetes mellitus without complications: Secondary | ICD-10-CM | POA: Diagnosis not present

## 2012-01-16 DIAGNOSIS — I2510 Atherosclerotic heart disease of native coronary artery without angina pectoris: Secondary | ICD-10-CM | POA: Diagnosis not present

## 2012-01-16 DIAGNOSIS — Z951 Presence of aortocoronary bypass graft: Secondary | ICD-10-CM | POA: Diagnosis not present

## 2012-01-16 DIAGNOSIS — I252 Old myocardial infarction: Secondary | ICD-10-CM | POA: Diagnosis not present

## 2012-01-16 DIAGNOSIS — Z435 Encounter for attention to cystostomy: Secondary | ICD-10-CM | POA: Diagnosis not present

## 2012-01-16 DIAGNOSIS — R339 Retention of urine, unspecified: Secondary | ICD-10-CM | POA: Diagnosis not present

## 2012-01-17 DIAGNOSIS — I252 Old myocardial infarction: Secondary | ICD-10-CM | POA: Diagnosis not present

## 2012-01-17 DIAGNOSIS — E119 Type 2 diabetes mellitus without complications: Secondary | ICD-10-CM | POA: Diagnosis not present

## 2012-01-17 DIAGNOSIS — R339 Retention of urine, unspecified: Secondary | ICD-10-CM | POA: Diagnosis not present

## 2012-01-17 DIAGNOSIS — Z435 Encounter for attention to cystostomy: Secondary | ICD-10-CM | POA: Diagnosis not present

## 2012-01-17 DIAGNOSIS — I2510 Atherosclerotic heart disease of native coronary artery without angina pectoris: Secondary | ICD-10-CM | POA: Diagnosis not present

## 2012-01-17 DIAGNOSIS — Z951 Presence of aortocoronary bypass graft: Secondary | ICD-10-CM | POA: Diagnosis not present

## 2012-01-18 DIAGNOSIS — E119 Type 2 diabetes mellitus without complications: Secondary | ICD-10-CM | POA: Diagnosis not present

## 2012-01-18 DIAGNOSIS — R339 Retention of urine, unspecified: Secondary | ICD-10-CM | POA: Diagnosis not present

## 2012-01-18 DIAGNOSIS — Z951 Presence of aortocoronary bypass graft: Secondary | ICD-10-CM | POA: Diagnosis not present

## 2012-01-18 DIAGNOSIS — Z435 Encounter for attention to cystostomy: Secondary | ICD-10-CM | POA: Diagnosis not present

## 2012-01-18 DIAGNOSIS — I252 Old myocardial infarction: Secondary | ICD-10-CM | POA: Diagnosis not present

## 2012-01-18 DIAGNOSIS — I2510 Atherosclerotic heart disease of native coronary artery without angina pectoris: Secondary | ICD-10-CM | POA: Diagnosis not present

## 2012-01-20 DIAGNOSIS — Z951 Presence of aortocoronary bypass graft: Secondary | ICD-10-CM | POA: Diagnosis not present

## 2012-01-20 DIAGNOSIS — I252 Old myocardial infarction: Secondary | ICD-10-CM | POA: Diagnosis not present

## 2012-01-20 DIAGNOSIS — Z435 Encounter for attention to cystostomy: Secondary | ICD-10-CM | POA: Diagnosis not present

## 2012-01-20 DIAGNOSIS — R339 Retention of urine, unspecified: Secondary | ICD-10-CM | POA: Diagnosis not present

## 2012-01-20 DIAGNOSIS — I2510 Atherosclerotic heart disease of native coronary artery without angina pectoris: Secondary | ICD-10-CM | POA: Diagnosis not present

## 2012-01-20 DIAGNOSIS — E119 Type 2 diabetes mellitus without complications: Secondary | ICD-10-CM | POA: Diagnosis not present

## 2012-01-21 ENCOUNTER — Ambulatory Visit: Payer: Medicare Other | Admitting: Surgery

## 2012-01-22 DIAGNOSIS — R339 Retention of urine, unspecified: Secondary | ICD-10-CM | POA: Diagnosis not present

## 2012-01-22 DIAGNOSIS — Z435 Encounter for attention to cystostomy: Secondary | ICD-10-CM | POA: Diagnosis not present

## 2012-01-22 DIAGNOSIS — E119 Type 2 diabetes mellitus without complications: Secondary | ICD-10-CM | POA: Diagnosis not present

## 2012-01-22 DIAGNOSIS — I2510 Atherosclerotic heart disease of native coronary artery without angina pectoris: Secondary | ICD-10-CM | POA: Diagnosis not present

## 2012-01-22 DIAGNOSIS — Z951 Presence of aortocoronary bypass graft: Secondary | ICD-10-CM | POA: Diagnosis not present

## 2012-01-22 DIAGNOSIS — I252 Old myocardial infarction: Secondary | ICD-10-CM | POA: Diagnosis not present

## 2012-01-23 DIAGNOSIS — I252 Old myocardial infarction: Secondary | ICD-10-CM | POA: Diagnosis not present

## 2012-01-23 DIAGNOSIS — I2510 Atherosclerotic heart disease of native coronary artery without angina pectoris: Secondary | ICD-10-CM | POA: Diagnosis not present

## 2012-01-23 DIAGNOSIS — R339 Retention of urine, unspecified: Secondary | ICD-10-CM | POA: Diagnosis not present

## 2012-01-23 DIAGNOSIS — Z435 Encounter for attention to cystostomy: Secondary | ICD-10-CM | POA: Diagnosis not present

## 2012-01-23 DIAGNOSIS — Z951 Presence of aortocoronary bypass graft: Secondary | ICD-10-CM | POA: Diagnosis not present

## 2012-01-23 DIAGNOSIS — E119 Type 2 diabetes mellitus without complications: Secondary | ICD-10-CM | POA: Diagnosis not present

## 2012-01-24 DIAGNOSIS — E119 Type 2 diabetes mellitus without complications: Secondary | ICD-10-CM | POA: Diagnosis not present

## 2012-01-24 DIAGNOSIS — R339 Retention of urine, unspecified: Secondary | ICD-10-CM | POA: Diagnosis not present

## 2012-01-24 DIAGNOSIS — Z951 Presence of aortocoronary bypass graft: Secondary | ICD-10-CM | POA: Diagnosis not present

## 2012-01-24 DIAGNOSIS — I2510 Atherosclerotic heart disease of native coronary artery without angina pectoris: Secondary | ICD-10-CM | POA: Diagnosis not present

## 2012-01-24 DIAGNOSIS — Z435 Encounter for attention to cystostomy: Secondary | ICD-10-CM | POA: Diagnosis not present

## 2012-01-24 DIAGNOSIS — I252 Old myocardial infarction: Secondary | ICD-10-CM | POA: Diagnosis not present

## 2012-01-29 DIAGNOSIS — Z951 Presence of aortocoronary bypass graft: Secondary | ICD-10-CM | POA: Diagnosis not present

## 2012-01-29 DIAGNOSIS — I252 Old myocardial infarction: Secondary | ICD-10-CM | POA: Diagnosis not present

## 2012-01-29 DIAGNOSIS — E119 Type 2 diabetes mellitus without complications: Secondary | ICD-10-CM | POA: Diagnosis not present

## 2012-01-29 DIAGNOSIS — R339 Retention of urine, unspecified: Secondary | ICD-10-CM | POA: Diagnosis not present

## 2012-01-29 DIAGNOSIS — I2510 Atherosclerotic heart disease of native coronary artery without angina pectoris: Secondary | ICD-10-CM | POA: Diagnosis not present

## 2012-01-29 DIAGNOSIS — Z435 Encounter for attention to cystostomy: Secondary | ICD-10-CM | POA: Diagnosis not present

## 2012-02-04 ENCOUNTER — Ambulatory Visit (INDEPENDENT_AMBULATORY_CARE_PROVIDER_SITE_OTHER): Payer: Self-pay | Admitting: Surgery

## 2012-02-04 ENCOUNTER — Encounter: Payer: Self-pay | Admitting: Surgery

## 2012-02-04 VITALS — BP 141/75 | HR 55 | Resp 18 | Ht 68.0 in | Wt 169.0 lb

## 2012-02-04 DIAGNOSIS — I2510 Atherosclerotic heart disease of native coronary artery without angina pectoris: Secondary | ICD-10-CM

## 2012-02-04 DIAGNOSIS — Z951 Presence of aortocoronary bypass graft: Secondary | ICD-10-CM

## 2012-02-05 DIAGNOSIS — E119 Type 2 diabetes mellitus without complications: Secondary | ICD-10-CM | POA: Diagnosis not present

## 2012-02-05 DIAGNOSIS — R339 Retention of urine, unspecified: Secondary | ICD-10-CM | POA: Diagnosis not present

## 2012-02-05 DIAGNOSIS — Z435 Encounter for attention to cystostomy: Secondary | ICD-10-CM | POA: Diagnosis not present

## 2012-02-05 DIAGNOSIS — I252 Old myocardial infarction: Secondary | ICD-10-CM | POA: Diagnosis not present

## 2012-02-05 DIAGNOSIS — I2510 Atherosclerotic heart disease of native coronary artery without angina pectoris: Secondary | ICD-10-CM | POA: Diagnosis not present

## 2012-02-05 DIAGNOSIS — Z951 Presence of aortocoronary bypass graft: Secondary | ICD-10-CM | POA: Diagnosis not present

## 2012-02-06 ENCOUNTER — Encounter: Payer: Self-pay | Admitting: Surgery

## 2012-02-06 DIAGNOSIS — Z951 Presence of aortocoronary bypass graft: Secondary | ICD-10-CM | POA: Insufficient documentation

## 2012-02-06 DIAGNOSIS — I2510 Atherosclerotic heart disease of native coronary artery without angina pectoris: Secondary | ICD-10-CM | POA: Insufficient documentation

## 2012-02-06 NOTE — Progress Notes (Signed)
[address]            [place] [address]          [phone]      HPI: Patient returns for routine postoperative follow-up having undergone coronary artery bypass graft surgery on 11/26/2011. The patient's early postoperative recovery while in the hospital was notable for a slow postoperative recovery. He had problems with urinary retention and was discharged to a skilled nursing facility with a urinary catheter in place. Since hospital discharge the patient reports he has subsequently undergone placement of a suprapubic urinary catheter for chronic urinary retention by Dr. Patsi Sears. He is now living with his daughter and he is slowly gaining strength.   Current Outpatient Prescriptions  Medication Sig Dispense Refill  . aspirin 325 MG tablet Take 325 mg by mouth daily after breakfast.      . atorvastatin (LIPITOR) 20 MG tablet Take 20 mg by mouth every evening.      Marland Kitchen esomeprazole (NEXIUM) 40 MG capsule Take 40 mg by mouth daily before breakfast.      . ferrous sulfate 325 (65 FE) MG tablet Take 325 mg by mouth 2 (two) times daily.      . furosemide (LASIX) 40 MG tablet Take 40 mg by mouth daily after breakfast.       . metFORMIN (GLUCOPHAGE) 500 MG tablet Take 1 tablet (500 mg total) by mouth 2 (two) times daily with a meal.      . methylcellulose (ARTIFICIAL TEARS) 1 % ophthalmic solution Place 2 drops into both eyes 4 (four) times daily.      . metoprolol tartrate (LOPRESSOR) 25 MG tablet Take 1 tablet (25 mg total) by mouth 2 (two) times daily.      . Multiple Vitamin (MULITIVITAMIN WITH MINERALS) TABS Take 1 tablet by mouth daily.      . potassium chloride SA (K-DUR,KLOR-CON) 20 MEQ tablet Take 2 tablets (40 mEq total) by mouth daily.      . rosuvastatin (CRESTOR) 10 MG tablet Take 10 mg by mouth at bedtime.      . sennosides-docusate sodium (SENOKOT-S) 8.6-50 MG tablet Take 2 tablets by mouth at bedtime.      Marland Kitchen URELLE (URELLE/URISED) 81 MG  TABS Take 1 tablet (81 mg total) by mouth 3 (three) times daily.  30 each  2  . oxycodone (OXY-IR) 5 MG capsule Take 5-15 mg by mouth every 3 (three) hours as needed. Pain        Physical Exam: BP 141/75  Pulse 55  Resp 18  Ht 5\' 8"  (1.727 m)  Wt 169 lb (76.658 kg)  BMI 25.70 kg/m2  SpO2 98% He looks well. Lungs clear. Cardiac exam shows a regular rate and rhythm with normal heart sounds. The chest incision is healing well and the sternum is stable. His leg incision is healing well and there is no peripheral edema.  Diagnostic Tests:  His last chest x-ray done in February showed mild bibasilar atelectasis.  Impression:  He is making slow but steady progress following surgery. I think he is doing as well as could be expected given his age. I encouraged him to continue walking as much as possible. I told him he could return to normal activity as of 02/26/2012.  Plan:  He will continue followup with cardiology and will contact my office if he develops any problems with his incisions.

## 2012-02-11 DIAGNOSIS — I10 Essential (primary) hypertension: Secondary | ICD-10-CM | POA: Diagnosis not present

## 2012-02-11 DIAGNOSIS — N400 Enlarged prostate without lower urinary tract symptoms: Secondary | ICD-10-CM | POA: Diagnosis not present

## 2012-02-11 DIAGNOSIS — E119 Type 2 diabetes mellitus without complications: Secondary | ICD-10-CM | POA: Diagnosis not present

## 2012-02-11 DIAGNOSIS — K219 Gastro-esophageal reflux disease without esophagitis: Secondary | ICD-10-CM | POA: Diagnosis not present

## 2012-02-19 DIAGNOSIS — I252 Old myocardial infarction: Secondary | ICD-10-CM | POA: Diagnosis not present

## 2012-02-19 DIAGNOSIS — E119 Type 2 diabetes mellitus without complications: Secondary | ICD-10-CM | POA: Diagnosis not present

## 2012-02-19 DIAGNOSIS — I2510 Atherosclerotic heart disease of native coronary artery without angina pectoris: Secondary | ICD-10-CM | POA: Diagnosis not present

## 2012-02-19 DIAGNOSIS — Z435 Encounter for attention to cystostomy: Secondary | ICD-10-CM | POA: Diagnosis not present

## 2012-02-19 DIAGNOSIS — R339 Retention of urine, unspecified: Secondary | ICD-10-CM | POA: Diagnosis not present

## 2012-02-19 DIAGNOSIS — Z951 Presence of aortocoronary bypass graft: Secondary | ICD-10-CM | POA: Diagnosis not present

## 2012-02-21 DIAGNOSIS — R339 Retention of urine, unspecified: Secondary | ICD-10-CM | POA: Diagnosis not present

## 2012-03-10 DIAGNOSIS — I2510 Atherosclerotic heart disease of native coronary artery without angina pectoris: Secondary | ICD-10-CM | POA: Diagnosis not present

## 2012-03-10 DIAGNOSIS — K219 Gastro-esophageal reflux disease without esophagitis: Secondary | ICD-10-CM | POA: Diagnosis not present

## 2012-03-10 DIAGNOSIS — Z435 Encounter for attention to cystostomy: Secondary | ICD-10-CM | POA: Diagnosis not present

## 2012-03-10 DIAGNOSIS — Z951 Presence of aortocoronary bypass graft: Secondary | ICD-10-CM | POA: Diagnosis not present

## 2012-03-10 DIAGNOSIS — I252 Old myocardial infarction: Secondary | ICD-10-CM | POA: Diagnosis not present

## 2012-03-10 DIAGNOSIS — Z0000 Encounter for general adult medical examination without abnormal findings: Secondary | ICD-10-CM | POA: Diagnosis not present

## 2012-03-10 DIAGNOSIS — E119 Type 2 diabetes mellitus without complications: Secondary | ICD-10-CM | POA: Diagnosis not present

## 2012-03-10 DIAGNOSIS — R7309 Other abnormal glucose: Secondary | ICD-10-CM | POA: Diagnosis not present

## 2012-03-10 DIAGNOSIS — R339 Retention of urine, unspecified: Secondary | ICD-10-CM | POA: Diagnosis not present

## 2012-03-11 DIAGNOSIS — E7800 Pure hypercholesterolemia, unspecified: Secondary | ICD-10-CM | POA: Diagnosis not present

## 2012-03-14 DIAGNOSIS — E119 Type 2 diabetes mellitus without complications: Secondary | ICD-10-CM | POA: Diagnosis not present

## 2012-03-14 DIAGNOSIS — I2510 Atherosclerotic heart disease of native coronary artery without angina pectoris: Secondary | ICD-10-CM | POA: Diagnosis not present

## 2012-03-14 DIAGNOSIS — Z435 Encounter for attention to cystostomy: Secondary | ICD-10-CM | POA: Diagnosis not present

## 2012-03-14 DIAGNOSIS — R339 Retention of urine, unspecified: Secondary | ICD-10-CM | POA: Diagnosis not present

## 2012-03-14 DIAGNOSIS — I252 Old myocardial infarction: Secondary | ICD-10-CM | POA: Diagnosis not present

## 2012-03-14 DIAGNOSIS — Z951 Presence of aortocoronary bypass graft: Secondary | ICD-10-CM | POA: Diagnosis not present

## 2012-03-16 DIAGNOSIS — R609 Edema, unspecified: Secondary | ICD-10-CM | POA: Diagnosis not present

## 2012-03-16 DIAGNOSIS — I2109 ST elevation (STEMI) myocardial infarction involving other coronary artery of anterior wall: Secondary | ICD-10-CM | POA: Diagnosis not present

## 2012-03-16 DIAGNOSIS — I2510 Atherosclerotic heart disease of native coronary artery without angina pectoris: Secondary | ICD-10-CM | POA: Diagnosis not present

## 2012-03-16 DIAGNOSIS — E7800 Pure hypercholesterolemia, unspecified: Secondary | ICD-10-CM | POA: Diagnosis not present

## 2012-03-19 DIAGNOSIS — R339 Retention of urine, unspecified: Secondary | ICD-10-CM | POA: Diagnosis not present

## 2012-03-19 DIAGNOSIS — E119 Type 2 diabetes mellitus without complications: Secondary | ICD-10-CM | POA: Diagnosis not present

## 2012-03-19 DIAGNOSIS — Z435 Encounter for attention to cystostomy: Secondary | ICD-10-CM | POA: Diagnosis not present

## 2012-03-19 DIAGNOSIS — I2510 Atherosclerotic heart disease of native coronary artery without angina pectoris: Secondary | ICD-10-CM | POA: Diagnosis not present

## 2012-03-19 DIAGNOSIS — I252 Old myocardial infarction: Secondary | ICD-10-CM | POA: Diagnosis not present

## 2012-03-19 DIAGNOSIS — Z951 Presence of aortocoronary bypass graft: Secondary | ICD-10-CM | POA: Diagnosis not present

## 2012-03-25 DIAGNOSIS — I2510 Atherosclerotic heart disease of native coronary artery without angina pectoris: Secondary | ICD-10-CM | POA: Diagnosis not present

## 2012-03-25 DIAGNOSIS — Z435 Encounter for attention to cystostomy: Secondary | ICD-10-CM | POA: Diagnosis not present

## 2012-03-25 DIAGNOSIS — Z951 Presence of aortocoronary bypass graft: Secondary | ICD-10-CM | POA: Diagnosis not present

## 2012-03-25 DIAGNOSIS — I252 Old myocardial infarction: Secondary | ICD-10-CM | POA: Diagnosis not present

## 2012-03-25 DIAGNOSIS — E119 Type 2 diabetes mellitus without complications: Secondary | ICD-10-CM | POA: Diagnosis not present

## 2012-03-25 DIAGNOSIS — R339 Retention of urine, unspecified: Secondary | ICD-10-CM | POA: Diagnosis not present

## 2012-04-06 ENCOUNTER — Other Ambulatory Visit: Payer: Self-pay | Admitting: Family Medicine

## 2012-04-06 DIAGNOSIS — M6281 Muscle weakness (generalized): Secondary | ICD-10-CM

## 2012-04-07 ENCOUNTER — Ambulatory Visit
Admission: RE | Admit: 2012-04-07 | Discharge: 2012-04-07 | Disposition: A | Discharge status: Home / Self Care | Payer: Medicare Other | Source: Ambulatory Visit | Attending: Family Medicine | Admitting: Family Medicine

## 2012-04-07 DIAGNOSIS — M6281 Muscle weakness (generalized): Secondary | ICD-10-CM

## 2012-04-07 DIAGNOSIS — G8190 Hemiplegia, unspecified affecting unspecified side: Secondary | ICD-10-CM | POA: Diagnosis not present

## 2012-04-20 DIAGNOSIS — M6281 Muscle weakness (generalized): Secondary | ICD-10-CM | POA: Diagnosis not present

## 2012-04-28 DIAGNOSIS — R339 Retention of urine, unspecified: Secondary | ICD-10-CM | POA: Diagnosis not present

## 2012-04-28 DIAGNOSIS — I2510 Atherosclerotic heart disease of native coronary artery without angina pectoris: Secondary | ICD-10-CM | POA: Diagnosis not present

## 2012-04-28 DIAGNOSIS — Z435 Encounter for attention to cystostomy: Secondary | ICD-10-CM | POA: Diagnosis not present

## 2012-04-28 DIAGNOSIS — I252 Old myocardial infarction: Secondary | ICD-10-CM | POA: Diagnosis not present

## 2012-04-28 DIAGNOSIS — Z951 Presence of aortocoronary bypass graft: Secondary | ICD-10-CM | POA: Diagnosis not present

## 2012-04-28 DIAGNOSIS — E119 Type 2 diabetes mellitus without complications: Secondary | ICD-10-CM | POA: Diagnosis not present

## 2012-05-05 DIAGNOSIS — I252 Old myocardial infarction: Secondary | ICD-10-CM | POA: Diagnosis not present

## 2012-05-05 DIAGNOSIS — Z435 Encounter for attention to cystostomy: Secondary | ICD-10-CM | POA: Diagnosis not present

## 2012-05-05 DIAGNOSIS — E119 Type 2 diabetes mellitus without complications: Secondary | ICD-10-CM | POA: Diagnosis not present

## 2012-05-05 DIAGNOSIS — Z951 Presence of aortocoronary bypass graft: Secondary | ICD-10-CM | POA: Diagnosis not present

## 2012-05-05 DIAGNOSIS — I2510 Atherosclerotic heart disease of native coronary artery without angina pectoris: Secondary | ICD-10-CM | POA: Diagnosis not present

## 2012-05-05 DIAGNOSIS — R339 Retention of urine, unspecified: Secondary | ICD-10-CM | POA: Diagnosis not present

## 2012-05-13 DIAGNOSIS — Z951 Presence of aortocoronary bypass graft: Secondary | ICD-10-CM | POA: Diagnosis not present

## 2012-05-13 DIAGNOSIS — I252 Old myocardial infarction: Secondary | ICD-10-CM | POA: Diagnosis not present

## 2012-05-13 DIAGNOSIS — R339 Retention of urine, unspecified: Secondary | ICD-10-CM | POA: Diagnosis not present

## 2012-05-13 DIAGNOSIS — Z439 Encounter for attention to unspecified artificial opening: Secondary | ICD-10-CM | POA: Diagnosis not present

## 2012-05-13 DIAGNOSIS — E119 Type 2 diabetes mellitus without complications: Secondary | ICD-10-CM | POA: Diagnosis not present

## 2012-05-13 DIAGNOSIS — I2510 Atherosclerotic heart disease of native coronary artery without angina pectoris: Secondary | ICD-10-CM | POA: Diagnosis not present

## 2012-05-13 DIAGNOSIS — Z435 Encounter for attention to cystostomy: Secondary | ICD-10-CM | POA: Diagnosis not present

## 2012-05-14 DIAGNOSIS — I252 Old myocardial infarction: Secondary | ICD-10-CM | POA: Diagnosis not present

## 2012-05-14 DIAGNOSIS — I2510 Atherosclerotic heart disease of native coronary artery without angina pectoris: Secondary | ICD-10-CM | POA: Diagnosis not present

## 2012-05-14 DIAGNOSIS — Z951 Presence of aortocoronary bypass graft: Secondary | ICD-10-CM | POA: Diagnosis not present

## 2012-05-14 DIAGNOSIS — E119 Type 2 diabetes mellitus without complications: Secondary | ICD-10-CM | POA: Diagnosis not present

## 2012-05-14 DIAGNOSIS — Z435 Encounter for attention to cystostomy: Secondary | ICD-10-CM | POA: Diagnosis not present

## 2012-05-14 DIAGNOSIS — R339 Retention of urine, unspecified: Secondary | ICD-10-CM | POA: Diagnosis not present

## 2012-05-22 DIAGNOSIS — R339 Retention of urine, unspecified: Secondary | ICD-10-CM | POA: Diagnosis not present

## 2012-05-22 DIAGNOSIS — N401 Enlarged prostate with lower urinary tract symptoms: Secondary | ICD-10-CM | POA: Diagnosis not present

## 2012-05-22 DIAGNOSIS — N3000 Acute cystitis without hematuria: Secondary | ICD-10-CM | POA: Diagnosis not present

## 2012-06-10 DIAGNOSIS — R7309 Other abnormal glucose: Secondary | ICD-10-CM | POA: Diagnosis not present

## 2012-06-10 DIAGNOSIS — R609 Edema, unspecified: Secondary | ICD-10-CM | POA: Diagnosis not present

## 2012-06-10 DIAGNOSIS — K219 Gastro-esophageal reflux disease without esophagitis: Secondary | ICD-10-CM | POA: Diagnosis not present

## 2012-06-12 DIAGNOSIS — R339 Retention of urine, unspecified: Secondary | ICD-10-CM | POA: Diagnosis not present

## 2012-06-23 DIAGNOSIS — N322 Vesical fistula, not elsewhere classified: Secondary | ICD-10-CM | POA: Diagnosis not present

## 2012-07-02 DIAGNOSIS — R7309 Other abnormal glucose: Secondary | ICD-10-CM | POA: Diagnosis not present

## 2012-07-21 DIAGNOSIS — N401 Enlarged prostate with lower urinary tract symptoms: Secondary | ICD-10-CM | POA: Diagnosis not present

## 2012-07-21 DIAGNOSIS — R82998 Other abnormal findings in urine: Secondary | ICD-10-CM | POA: Diagnosis not present

## 2012-07-21 DIAGNOSIS — N322 Vesical fistula, not elsewhere classified: Secondary | ICD-10-CM | POA: Diagnosis not present

## 2012-07-23 DIAGNOSIS — Z23 Encounter for immunization: Secondary | ICD-10-CM | POA: Diagnosis not present

## 2012-08-03 DIAGNOSIS — E860 Dehydration: Secondary | ICD-10-CM | POA: Diagnosis not present

## 2012-09-14 DIAGNOSIS — Z79899 Other long term (current) drug therapy: Secondary | ICD-10-CM | POA: Diagnosis not present

## 2012-09-14 DIAGNOSIS — I2510 Atherosclerotic heart disease of native coronary artery without angina pectoris: Secondary | ICD-10-CM | POA: Diagnosis not present

## 2012-09-16 DIAGNOSIS — I2109 ST elevation (STEMI) myocardial infarction involving other coronary artery of anterior wall: Secondary | ICD-10-CM | POA: Diagnosis not present

## 2012-09-16 DIAGNOSIS — I2510 Atherosclerotic heart disease of native coronary artery without angina pectoris: Secondary | ICD-10-CM | POA: Diagnosis not present

## 2012-09-16 DIAGNOSIS — R609 Edema, unspecified: Secondary | ICD-10-CM | POA: Diagnosis not present

## 2012-09-16 DIAGNOSIS — E7800 Pure hypercholesterolemia, unspecified: Secondary | ICD-10-CM | POA: Diagnosis not present

## 2012-10-01 DIAGNOSIS — L821 Other seborrheic keratosis: Secondary | ICD-10-CM | POA: Diagnosis not present

## 2012-10-01 DIAGNOSIS — Z85828 Personal history of other malignant neoplasm of skin: Secondary | ICD-10-CM | POA: Diagnosis not present

## 2012-10-01 DIAGNOSIS — L819 Disorder of pigmentation, unspecified: Secondary | ICD-10-CM | POA: Diagnosis not present

## 2012-10-01 DIAGNOSIS — L570 Actinic keratosis: Secondary | ICD-10-CM | POA: Diagnosis not present

## 2012-12-23 DIAGNOSIS — Z961 Presence of intraocular lens: Secondary | ICD-10-CM | POA: Diagnosis not present

## 2012-12-23 DIAGNOSIS — H04129 Dry eye syndrome of unspecified lacrimal gland: Secondary | ICD-10-CM | POA: Diagnosis not present

## 2012-12-23 DIAGNOSIS — H02139 Senile ectropion of unspecified eye, unspecified eyelid: Secondary | ICD-10-CM | POA: Diagnosis not present

## 2012-12-31 DIAGNOSIS — L821 Other seborrheic keratosis: Secondary | ICD-10-CM | POA: Diagnosis not present

## 2012-12-31 DIAGNOSIS — L851 Acquired keratosis [keratoderma] palmaris et plantaris: Secondary | ICD-10-CM | POA: Diagnosis not present

## 2012-12-31 DIAGNOSIS — Z85828 Personal history of other malignant neoplasm of skin: Secondary | ICD-10-CM | POA: Diagnosis not present

## 2012-12-31 DIAGNOSIS — D485 Neoplasm of uncertain behavior of skin: Secondary | ICD-10-CM | POA: Diagnosis not present

## 2012-12-31 DIAGNOSIS — L570 Actinic keratosis: Secondary | ICD-10-CM | POA: Diagnosis not present

## 2013-01-05 DIAGNOSIS — K219 Gastro-esophageal reflux disease without esophagitis: Secondary | ICD-10-CM | POA: Diagnosis not present

## 2013-01-05 DIAGNOSIS — R7309 Other abnormal glucose: Secondary | ICD-10-CM | POA: Diagnosis not present

## 2013-01-05 DIAGNOSIS — R609 Edema, unspecified: Secondary | ICD-10-CM | POA: Diagnosis not present

## 2013-01-20 DIAGNOSIS — N401 Enlarged prostate with lower urinary tract symptoms: Secondary | ICD-10-CM | POA: Diagnosis not present

## 2013-03-23 DIAGNOSIS — R609 Edema, unspecified: Secondary | ICD-10-CM | POA: Diagnosis not present

## 2013-03-23 DIAGNOSIS — E7800 Pure hypercholesterolemia, unspecified: Secondary | ICD-10-CM | POA: Diagnosis not present

## 2013-03-23 DIAGNOSIS — I2109 ST elevation (STEMI) myocardial infarction involving other coronary artery of anterior wall: Secondary | ICD-10-CM | POA: Diagnosis not present

## 2013-03-23 DIAGNOSIS — I2510 Atherosclerotic heart disease of native coronary artery without angina pectoris: Secondary | ICD-10-CM | POA: Diagnosis not present

## 2013-04-22 DIAGNOSIS — E7800 Pure hypercholesterolemia, unspecified: Secondary | ICD-10-CM | POA: Diagnosis not present

## 2013-04-27 DIAGNOSIS — R609 Edema, unspecified: Secondary | ICD-10-CM | POA: Diagnosis not present

## 2013-04-27 DIAGNOSIS — I2109 ST elevation (STEMI) myocardial infarction involving other coronary artery of anterior wall: Secondary | ICD-10-CM | POA: Diagnosis not present

## 2013-04-27 DIAGNOSIS — E7800 Pure hypercholesterolemia, unspecified: Secondary | ICD-10-CM | POA: Diagnosis not present

## 2013-04-27 DIAGNOSIS — I2510 Atherosclerotic heart disease of native coronary artery without angina pectoris: Secondary | ICD-10-CM | POA: Diagnosis not present

## 2013-07-11 IMAGING — CR DG CHEST 2V
2 series · 2 of 2 positions shown · non-contrast
Comparison: Chest x-ray of 11/29/2010 and CT chest of 02/25/2008

CLINICAL DATA: CABG, follow-up

CHEST - 2 VIEW

[w chest ap]
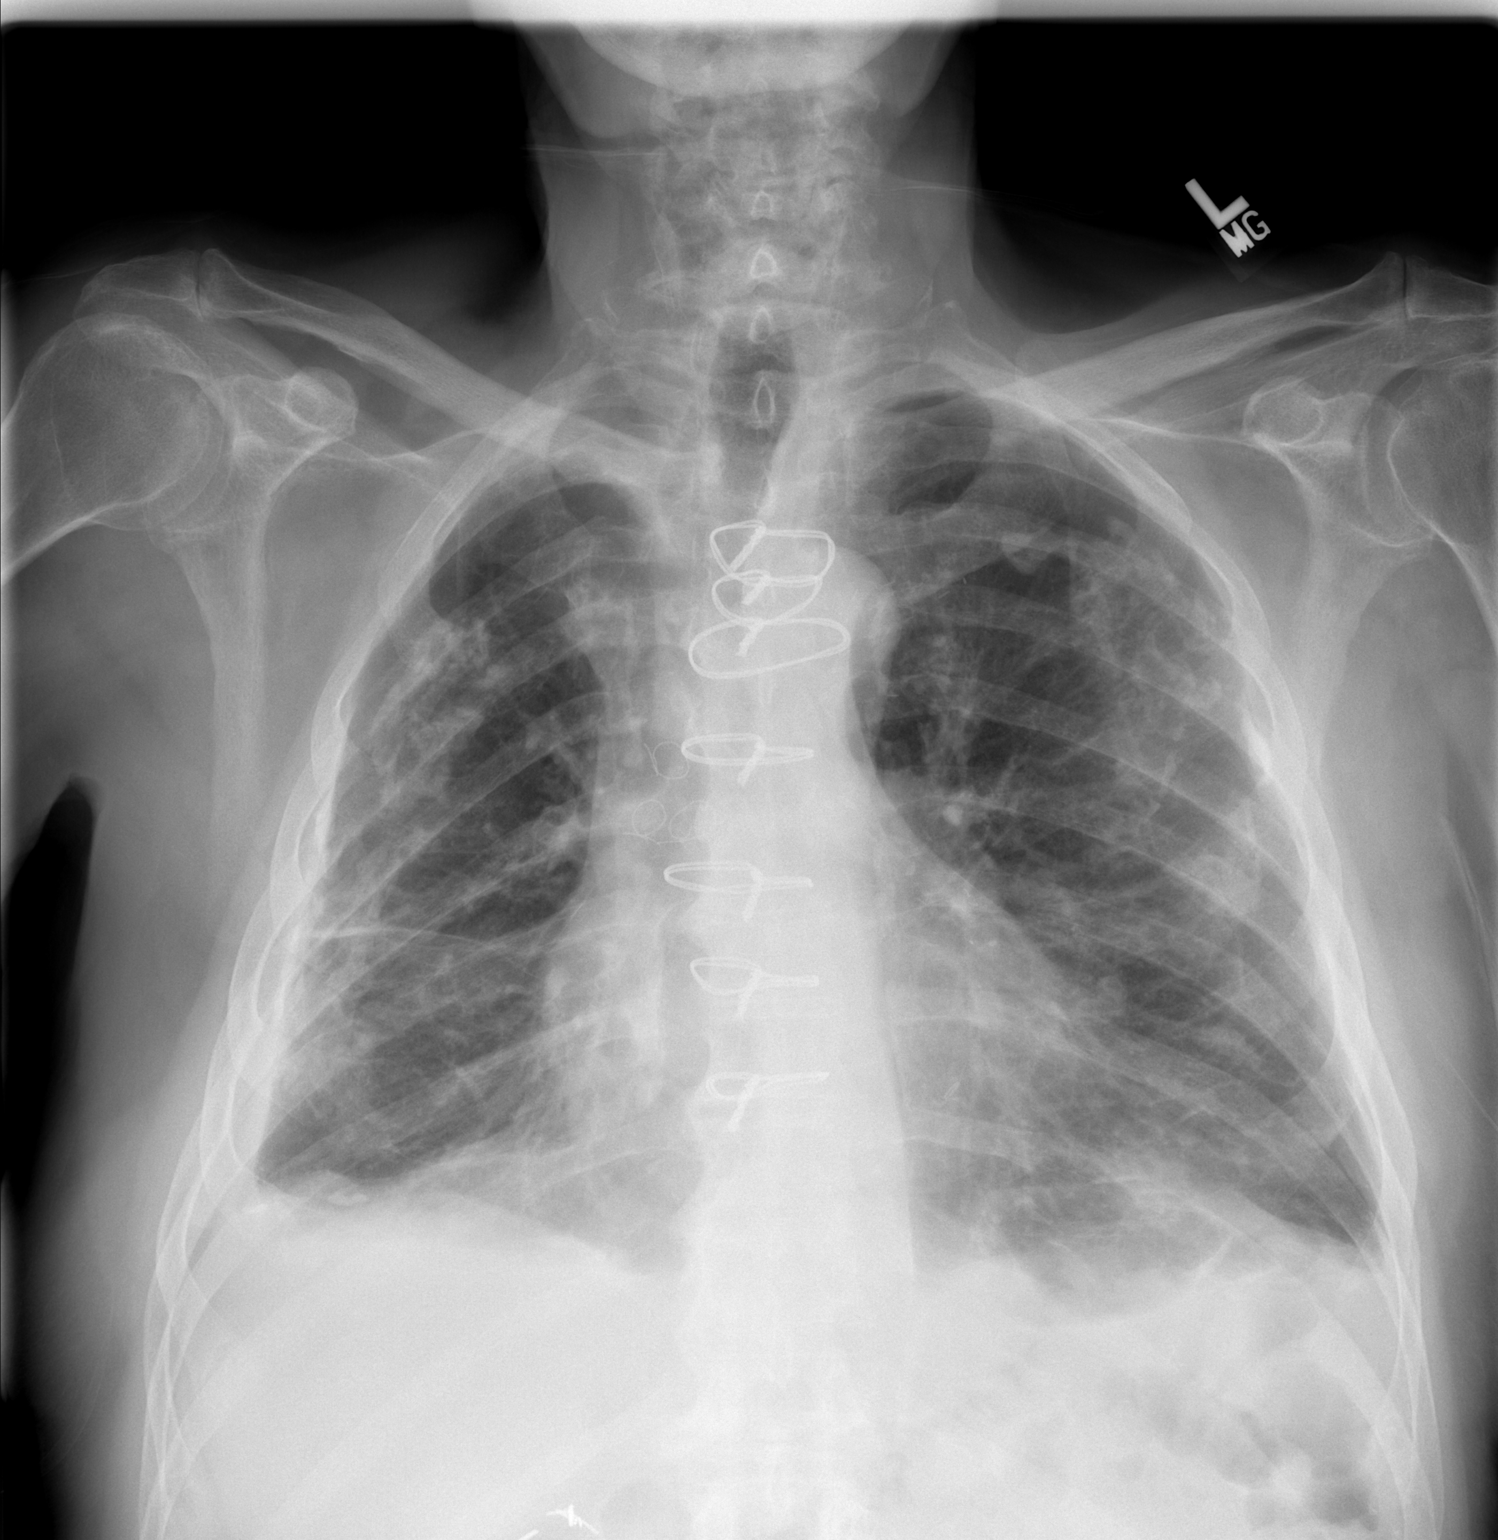

[w chest lat]
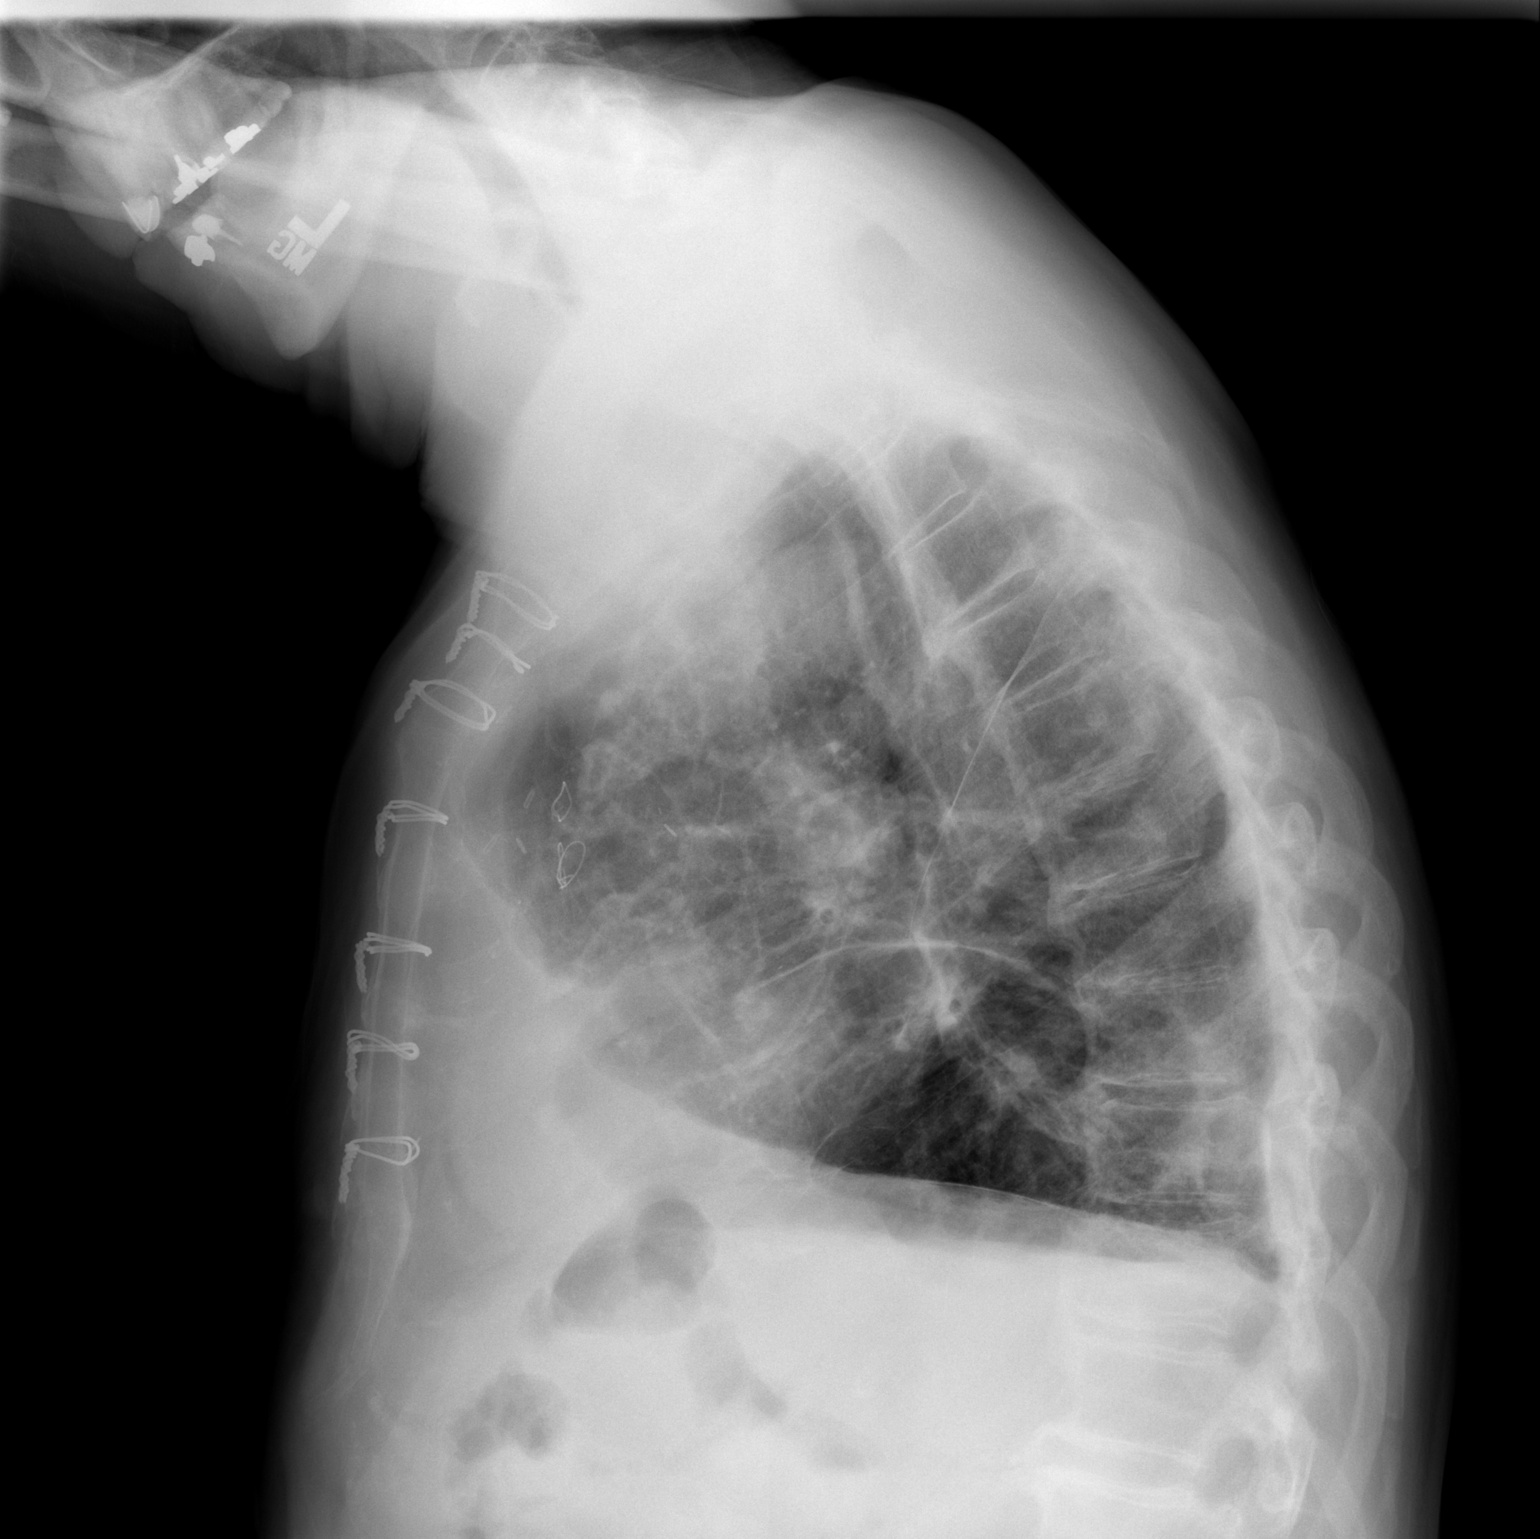

[2 of 2 positions shown; findings below may reference images not displayed]

FINDINGS: There is little change in bilateral pleural plaques, many
of which are calcified, when compared to the prior CT.  Pleural
thickening at the lung bases appears stable with blunted
costophrenic angles.  No active infiltrate or effusion is seen.
Mild cardiomegaly is stable.  Median sternotomy sutures are intact
from recent CABG.
IMPRESSION: Stable chronic change with calcified pleural plaques and scarring.
No active lung disease.

## 2013-07-20 DIAGNOSIS — Z23 Encounter for immunization: Secondary | ICD-10-CM | POA: Diagnosis not present

## 2013-07-28 DIAGNOSIS — L570 Actinic keratosis: Secondary | ICD-10-CM | POA: Diagnosis not present

## 2013-07-28 DIAGNOSIS — Z85828 Personal history of other malignant neoplasm of skin: Secondary | ICD-10-CM | POA: Diagnosis not present

## 2013-07-28 DIAGNOSIS — L821 Other seborrheic keratosis: Secondary | ICD-10-CM | POA: Diagnosis not present

## 2013-08-27 ENCOUNTER — Encounter: Payer: Self-pay | Admitting: Interventional Cardiology

## 2013-09-08 DIAGNOSIS — H02139 Senile ectropion of unspecified eye, unspecified eyelid: Secondary | ICD-10-CM | POA: Diagnosis not present

## 2013-09-25 ENCOUNTER — Other Ambulatory Visit: Payer: Self-pay | Admitting: Interventional Cardiology

## 2013-10-26 ENCOUNTER — Encounter: Payer: Self-pay | Admitting: Cardiology

## 2013-11-08 ENCOUNTER — Ambulatory Visit: Payer: Medicare Other | Admitting: Interventional Cardiology

## 2013-11-10 ENCOUNTER — Ambulatory Visit (INDEPENDENT_AMBULATORY_CARE_PROVIDER_SITE_OTHER): Payer: Medicare Other | Admitting: Interventional Cardiology

## 2013-11-10 ENCOUNTER — Encounter: Payer: Self-pay | Admitting: Interventional Cardiology

## 2013-11-10 VITALS — BP 118/66 | HR 60 | Ht 68.0 in | Wt 184.0 lb

## 2013-11-10 DIAGNOSIS — I252 Old myocardial infarction: Secondary | ICD-10-CM | POA: Insufficient documentation

## 2013-11-10 DIAGNOSIS — Z79899 Other long term (current) drug therapy: Secondary | ICD-10-CM

## 2013-11-10 DIAGNOSIS — R609 Edema, unspecified: Secondary | ICD-10-CM | POA: Diagnosis not present

## 2013-11-10 DIAGNOSIS — E782 Mixed hyperlipidemia: Secondary | ICD-10-CM | POA: Diagnosis not present

## 2013-11-10 LAB — BASIC METABOLIC PANEL
BUN: 22 mg/dL (ref 6–23)
CALCIUM: 9.6 mg/dL (ref 8.4–10.5)
CHLORIDE: 104 meq/L (ref 96–112)
CO2: 29 meq/L (ref 19–32)
CREATININE: 1.3 mg/dL (ref 0.4–1.5)
GFR: 53.92 mL/min — ABNORMAL LOW (ref >60.00)
GLUCOSE: 100 mg/dL — AB (ref 70–99)
Potassium: 4.6 mEq/L (ref 3.5–5.1)
SODIUM: 140 meq/L (ref 135–145)

## 2013-11-10 MED ORDER — ATORVASTATIN CALCIUM 20 MG PO TABS: ORAL_TABLET | ORAL | Status: DC

## 2013-11-10 NOTE — Progress Notes (Signed)
Patient ID: Ryan Pacheco, male   DOB: [birth year], 80 y.o.   MRN: [id number]    [facility], [place], [place]  [address] Phone: [phone] Fax:  [phone]  Date:  11/10/2013   ID:  Ryan Pacheco, DOB 04-Jun-1920, MRN [id number]  PCP:  [name], MD      History of Present Illness: Ryan Pacheco is a 80 y.o. male who had CABG in Jan 2013 . No exertional chest pain. Chest soreness resolved. He has not had any sx like he had with ischemia. He walks around his house and yard for exercise. He is not having excessive fatigue. Occasional h/a relieved with tylenol. He started diuretic due to a congested feeling in his chest. CAD/ASCVD:  Denies : Chest pain.  Dizziness.  Leg edema.  Nitroglycerin.  Orthopnea.  Palpitations.  Syncope.   When his family member comes in, he may skip his diuretic for 4 days.  He will retain fluid and it takes a week to equilibrate.  No orthopnea at those times.    Wt Readings from Last 3 Encounters:  11/10/13 184 lb (83.462 kg)  02/04/12 169 lb (76.658 kg)  01/09/12 170 lb (77.111 kg)     Past Medical History  Diagnosis Date  . Edema   . Acid reflux   . Benign prostatic hypertrophy   . Bradycardia   . Embolism - blood clot ~ 2000    "behind left knee"  . History of asbestos exposure     "have some lung disease from years of exposure"  . Angina   . Myocardial infarction 11/17/10     NSTEMI  . Arthritis 01-09-12    hands, foot  . Prostate hypertrophy 01-09-12    urinary retention-Foley catheter at present; surgery planned  . Diabetes mellitus     Current Outpatient Prescriptions  Medication Sig Dispense Refill  . aspirin 325 MG tablet Take 325 mg by mouth every 4 (four) hours as needed.      Marland Kitchen atorvastatin (LIPITOR) 20 MG tablet 1/2 tablet by mouth daily  90 tablet  0  . esomeprazole (NEXIUM) 40 MG capsule Take 40 mg by mouth daily before breakfast.      . ferrous sulfate 325 (65 FE) MG tablet Take 325 mg by mouth  2 (two) times daily.      . furosemide (LASIX) 40 MG tablet Take 40 mg by mouth daily after breakfast.       . methylcellulose (ARTIFICIAL TEARS) 1 % ophthalmic solution Place 2 drops into both eyes 4 (four) times daily.      . Multiple Vitamin (MULITIVITAMIN WITH MINERALS) TABS Take 1 tablet by mouth daily.      . pantoprazole (PROTONIX) 40 MG tablet Take 40 mg by mouth daily.      . metFORMIN (GLUCOPHAGE) 500 MG tablet Take 1 tablet (500 mg total) by mouth 2 (two) times daily with a meal.      . metoprolol tartrate (LOPRESSOR) 25 MG tablet Take 1 tablet (25 mg total) by mouth 2 (two) times daily.      . potassium chloride SA (K-DUR,KLOR-CON) 20 MEQ tablet Take 2 tablets (40 mEq total) by mouth daily.      . rosuvastatin (CRESTOR) 10 MG tablet Take 10 mg by mouth at bedtime.       No current facility-administered medications for this visit.    Allergies:   No Known Allergies  Social History:  The patient  reports that  he has quit smoking. His smoking use included Cigars. He has quit using smokeless tobacco. His smokeless tobacco use included Chew. He reports that he does not drink alcohol or use illicit drugs.   Family History:  The patient's family history is not on file.   ROS:  Please see the history of present illness.  No nausea, vomiting.  No fevers, chills.  No focal weakness.  No dysuria. As above.   All other systems reviewed and negative.   PHYSICAL EXAM: VS:  BP 118/66  Pulse 60  Ht 5\' 8"  (1.727 m)  Wt 184 lb (83.462 kg)  BMI 27.98 kg/m2 Well nourished, well developed, in no acute distress HEENT: normal Neck: no JVD, no carotid bruits Cardiac:  normal S1, S2; RRR;  Lungs:  clear to auscultation bilaterally, no wheezing, rhonchi or rales Abd: soft, nontender, no hepatomegaly Ext: no edema Skin: warm and dry Neuro:   no focal abnormalities noted      ASSESSMENT AND PLAN:  Coronary atherosclerosis of native coronary artery  Notes: No angina.  s/p CABG.  Had  NSTEMI.  2. Pure hypercholesterolemia  Continue Atorvastatin Calcium Tablet, 10 MG, 1 tablet, Orally, Once a day, 90 day(s), 90, Refills 3 Notes: Change to generic for cost savings. LDL target is 70. cholesterol very low. continue decreased atorvastatin 10 mg daily. LDL 53 in 6/14.  3. Myocardial infarction, anterior wall, subsequent care  Notes: No CHF sx. Bradycardia limiting beta blocker use. HR better off of metoprolol.  4. Bilateral leg edema  Notes: Chronic. Likely from venous insufficiency and prior DVT.Dr. Alyson Ingles Increased Furosemide (fluid pill) to twice a day for a few days to help with the swelling if needed. Elevate legs at night to reduce swelling.   Check BMet given increased dose of diuretics.    Signed, Mina Marble, MD, [facility] 11/10/2013 2:32 PM

## 2013-11-10 NOTE — Patient Instructions (Signed)
Your physician wants you to follow-up in: 6 Months with Dr. Irish Lack. You will receive a reminder letter in the mail two months in advance. If you don't receive a letter, please call our office to schedule the follow-up appointment.  Your physician recommends that you continue on your current medications as directed. Please refer to the Current Medication list given to you today.  Your physician recommends that you return for lab work today for Bmet.

## 2013-11-29 DIAGNOSIS — Z1331 Encounter for screening for depression: Secondary | ICD-10-CM | POA: Diagnosis not present

## 2013-11-29 DIAGNOSIS — K219 Gastro-esophageal reflux disease without esophagitis: Secondary | ICD-10-CM | POA: Diagnosis not present

## 2013-11-29 DIAGNOSIS — Z23 Encounter for immunization: Secondary | ICD-10-CM | POA: Diagnosis not present

## 2013-11-29 DIAGNOSIS — R609 Edema, unspecified: Secondary | ICD-10-CM | POA: Diagnosis not present

## 2013-11-29 DIAGNOSIS — E7800 Pure hypercholesterolemia, unspecified: Secondary | ICD-10-CM | POA: Diagnosis not present

## 2013-11-29 DIAGNOSIS — Z0000 Encounter for general adult medical examination without abnormal findings: Secondary | ICD-10-CM | POA: Diagnosis not present

## 2013-11-29 DIAGNOSIS — G44209 Tension-type headache, unspecified, not intractable: Secondary | ICD-10-CM | POA: Diagnosis not present

## 2014-02-08 DIAGNOSIS — N401 Enlarged prostate with lower urinary tract symptoms: Secondary | ICD-10-CM | POA: Diagnosis not present

## 2014-02-08 DIAGNOSIS — R972 Elevated prostate specific antigen [PSA]: Secondary | ICD-10-CM | POA: Diagnosis not present

## 2014-06-13 ENCOUNTER — Encounter: Payer: Self-pay | Admitting: Interventional Cardiology

## 2014-06-13 ENCOUNTER — Ambulatory Visit (INDEPENDENT_AMBULATORY_CARE_PROVIDER_SITE_OTHER): Payer: Medicare Other | Admitting: Interventional Cardiology

## 2014-06-13 VITALS — BP 144/80 | HR 56 | Ht 67.0 in | Wt 184.0 lb

## 2014-06-13 DIAGNOSIS — I2510 Atherosclerotic heart disease of native coronary artery without angina pectoris: Secondary | ICD-10-CM | POA: Diagnosis not present

## 2014-06-13 DIAGNOSIS — E782 Mixed hyperlipidemia: Secondary | ICD-10-CM

## 2014-06-13 DIAGNOSIS — R609 Edema, unspecified: Secondary | ICD-10-CM

## 2014-06-13 DIAGNOSIS — I252 Old myocardial infarction: Secondary | ICD-10-CM | POA: Diagnosis not present

## 2014-06-13 NOTE — Patient Instructions (Signed)
Your physician recommends that you return for a FASTING lipid and hepatic  Your physician recommends that you continue on your current medications as directed. Please refer to the Current Medication list given to you today.  Your physician wants you to follow-up in: 9 months with Dr. Irish Lack. You will receive a reminder letter in the mail two months in advance. If you don't receive a letter, please call our office to schedule the follow-up appointment.

## 2014-06-13 NOTE — Progress Notes (Signed)
Patient ID: Ryan Pacheco, male   DOB: 01/07/1920, 80 y.o.   MRN: [id number] Patient ID: Ryan Pacheco, male   DOB: 09/07/1920, 80 y.o.   MRN: [id number]    [facility], [place], [place]  [address] Phone: [phone] Fax:  [phone]  Date:  06/13/2014   ID:  Ryan Pacheco, DOB [birth year], MRN [id number]  PCP:  [name], MD      History of Present Illness: Ryan Pacheco is a 80 y.o. male who had CABG in Jan 2013 . No exertional chest pain. Chest soreness resolved. He has not had any sx like he had with ischemia. He walks around his house and yard for exercise.  Limits going out because of diuretic. He is not having excessive fatigue. Occasional h/a relieved with tylenol. He started diuretic due to a congested feeling in his chest. CAD/ASCVD:  Denies : Chest pain.  Dizziness.  Leg edema.  Nitroglycerin.  Orthopnea.  Palpitations.  Syncope.   When his family member comes in, he may skip his diuretic for 4 days.  He will retain fluid and it takes a week to equilibrate.  No orthopnea at those times.    He would like to skip 1-2 days per week.  Wt Readings from Last 3 Encounters:  06/13/14 184 lb (83.462 kg)  11/10/13 184 lb (83.462 kg)  02/04/12 169 lb (76.658 kg)     Past Medical History  Diagnosis Date  . Edema   . Acid reflux   . Benign prostatic hypertrophy   . Bradycardia   . Embolism - blood clot ~ 2000    "behind left knee"  . History of asbestos exposure     "have some lung disease from years of exposure"  . Angina   . Myocardial infarction 11/17/10     NSTEMI  . Arthritis 01-09-12    hands, foot  . Prostate hypertrophy 01-09-12    urinary retention-Foley catheter at present; surgery planned  . Diabetes mellitus     Current Outpatient Prescriptions  Medication Sig Dispense Refill  . aspirin 325 MG tablet Take 325 mg by mouth every 4 (four) hours as needed.      Marland Kitchen atorvastatin (LIPITOR) 20 MG tablet 1/2 tablet by mouth daily  45  tablet  1  . ferrous sulfate 325 (65 FE) MG tablet Take 325 mg by mouth 2 (two) times daily.      . furosemide (LASIX) 40 MG tablet Take 40 mg by mouth daily after breakfast.       . methylcellulose (ARTIFICIAL TEARS) 1 % ophthalmic solution Place 2 drops into both eyes 4 (four) times daily.      . Multiple Vitamin (MULITIVITAMIN WITH MINERALS) TABS Take 1 tablet by mouth daily.      . pantoprazole (PROTONIX) 40 MG tablet Take 40 mg by mouth daily.      . potassium chloride SA (K-DUR,KLOR-CON) 20 MEQ tablet Take 2 tablets (40 mEq total) by mouth daily.       No current facility-administered medications for this visit.    Allergies:   No Known Allergies  Social History:  The patient  reports that he has quit smoking. His smoking use included Cigars. He has quit using smokeless tobacco. His smokeless tobacco use included Chew. He reports that he does not drink alcohol or use illicit drugs.   Family History:  The patient's family history is not on file.   ROS:  Please see the history  of present illness.  No nausea, vomiting.  No fevers, chills. No syncope.  No focal weakness.  No dysuria. As above.   All other systems reviewed and negative.   PHYSICAL EXAM: VS:  BP 144/80  Pulse 56  Ht 5\' 7"  (1.702 m)  Wt 184 lb (83.462 kg)  BMI 28.81 kg/m2 Well nourished, well developed, in no acute distress HEENT: normal Neck: no JVD, no carotid bruits Cardiac:  normal S1, S2; RRR;  Lungs:  clear to auscultation bilaterally, no wheezing, rhonchi or rales Abd: soft, nontender, no hepatomegaly Ext: no edema Skin: warm and dry Neuro:   no focal abnormalities noted    ECG: Sinus bradycardia, ST depressions laterally-no change from prior  ASSESSMENT AND PLAN:  Coronary atherosclerosis of native coronary artery  Notes: No angina.  s/p CABG.  Had NSTEMI. Surgery was in 1/13. 2. Pure hypercholesterolemia  Continue Atorvastatin Calcium Tablet, 10 MG, 1 tablet, Orally, Once a day, 90 day(s), 90,  Refills 3 Notes: Change to generic for cost savings. LDL target is 70. cholesterol very low. continue decreased atorvastatin 10 mg daily. LDL 53 in 6/14.  He needs a repeat cholesterol check and liver check. 3. Myocardial infarction, anterior wall, subsequent care  Notes: No CHF sx. Bradycardia limiting beta blocker use. HR better off of metoprolol.  4. Bilateral leg edema  Notes: Chronic. Likely from venous insufficiency and prior DVT.  OK to Increase Furosemide (fluid pill) to twice a day for a few days to help with the swelling if needed. Elevate legs at night to reduce swelling.   Normal Cr in 2/15.    Signed, Mina Marble, MD, [facility] 06/13/2014 4:17 PM

## 2014-06-15 ENCOUNTER — Other Ambulatory Visit (INDEPENDENT_AMBULATORY_CARE_PROVIDER_SITE_OTHER): Payer: Medicare Other

## 2014-06-15 DIAGNOSIS — E782 Mixed hyperlipidemia: Secondary | ICD-10-CM

## 2014-06-15 LAB — LIPID PANEL
CHOL/HDL RATIO: 2
Cholesterol: 124 mg/dL (ref 0–200)
HDL: 52.5 mg/dL (ref >39.00)
LDL CALC: 52 mg/dL (ref 0–99)
NONHDL: 71.5
Triglycerides: 100 mg/dL (ref 0.0–149.0)
VLDL: 20 mg/dL (ref 0.0–40.0)

## 2014-06-15 LAB — HEPATIC FUNCTION PANEL
ALBUMIN: 3.9 g/dL (ref 3.5–5.2)
ALK PHOS: 57 U/L (ref 39–117)
ALT: 17 U/L (ref 0–53)
AST: 19 U/L (ref 0–37)
Bilirubin, Direct: 0.1 mg/dL (ref 0.0–0.3)
Total Bilirubin: 0.7 mg/dL (ref 0.2–1.2)
Total Protein: 7.1 g/dL (ref 6.0–8.3)

## 2014-06-21 ENCOUNTER — Other Ambulatory Visit: Payer: Self-pay | Admitting: Interventional Cardiology

## 2014-06-22 ENCOUNTER — Other Ambulatory Visit: Payer: Self-pay | Admitting: Cardiology

## 2014-06-22 DIAGNOSIS — E782 Mixed hyperlipidemia: Secondary | ICD-10-CM

## 2014-07-14 DIAGNOSIS — K219 Gastro-esophageal reflux disease without esophagitis: Secondary | ICD-10-CM | POA: Diagnosis not present

## 2014-07-14 DIAGNOSIS — R059 Cough, unspecified: Secondary | ICD-10-CM | POA: Diagnosis not present

## 2014-07-14 DIAGNOSIS — R609 Edema, unspecified: Secondary | ICD-10-CM | POA: Diagnosis not present

## 2014-07-14 DIAGNOSIS — Z23 Encounter for immunization: Secondary | ICD-10-CM | POA: Diagnosis not present

## 2014-07-14 DIAGNOSIS — Z77090 Contact with and (suspected) exposure to asbestos: Secondary | ICD-10-CM | POA: Diagnosis not present

## 2014-07-14 DIAGNOSIS — R05 Cough: Secondary | ICD-10-CM | POA: Diagnosis not present

## 2014-07-20 ENCOUNTER — Other Ambulatory Visit: Payer: Self-pay | Admitting: Family Medicine

## 2014-07-20 ENCOUNTER — Ambulatory Visit
Admission: RE | Admit: 2014-07-20 | Discharge: 2014-07-20 | Disposition: A | Discharge status: Home / Self Care | Payer: Medicare Other | Source: Ambulatory Visit | Attending: Family Medicine | Admitting: Family Medicine

## 2014-07-20 DIAGNOSIS — L820 Inflamed seborrheic keratosis: Secondary | ICD-10-CM | POA: Diagnosis not present

## 2014-07-20 DIAGNOSIS — R091 Pleurisy: Secondary | ICD-10-CM | POA: Diagnosis not present

## 2014-07-20 DIAGNOSIS — Z77090 Contact with and (suspected) exposure to asbestos: Secondary | ICD-10-CM

## 2014-07-20 DIAGNOSIS — L570 Actinic keratosis: Secondary | ICD-10-CM | POA: Diagnosis not present

## 2014-07-20 DIAGNOSIS — Z951 Presence of aortocoronary bypass graft: Secondary | ICD-10-CM | POA: Diagnosis not present

## 2014-07-20 DIAGNOSIS — Z85828 Personal history of other malignant neoplasm of skin: Secondary | ICD-10-CM | POA: Diagnosis not present

## 2014-07-20 DIAGNOSIS — L821 Other seborrheic keratosis: Secondary | ICD-10-CM | POA: Diagnosis not present

## 2014-07-20 DIAGNOSIS — J398 Other specified diseases of upper respiratory tract: Secondary | ICD-10-CM | POA: Diagnosis not present

## 2014-10-06 ENCOUNTER — Encounter (HOSPITAL_COMMUNITY): Payer: Self-pay | Admitting: Interventional Cardiology

## 2014-12-21 DIAGNOSIS — D485 Neoplasm of uncertain behavior of skin: Secondary | ICD-10-CM | POA: Diagnosis not present

## 2014-12-21 DIAGNOSIS — L821 Other seborrheic keratosis: Secondary | ICD-10-CM | POA: Diagnosis not present

## 2014-12-21 DIAGNOSIS — Z85828 Personal history of other malignant neoplasm of skin: Secondary | ICD-10-CM | POA: Diagnosis not present

## 2014-12-21 DIAGNOSIS — C4442 Squamous cell carcinoma of skin of scalp and neck: Secondary | ICD-10-CM | POA: Diagnosis not present

## 2014-12-21 DIAGNOSIS — L570 Actinic keratosis: Secondary | ICD-10-CM | POA: Diagnosis not present

## 2014-12-24 ENCOUNTER — Other Ambulatory Visit: Payer: Self-pay | Admitting: Interventional Cardiology

## 2015-02-15 DIAGNOSIS — N401 Enlarged prostate with lower urinary tract symptoms: Secondary | ICD-10-CM | POA: Diagnosis not present

## 2015-02-15 DIAGNOSIS — N390 Urinary tract infection, site not specified: Secondary | ICD-10-CM | POA: Diagnosis not present

## 2015-02-15 DIAGNOSIS — N3000 Acute cystitis without hematuria: Secondary | ICD-10-CM | POA: Diagnosis not present

## 2015-02-15 DIAGNOSIS — R339 Retention of urine, unspecified: Secondary | ICD-10-CM | POA: Diagnosis not present

## 2015-02-15 DIAGNOSIS — N138 Other obstructive and reflux uropathy: Secondary | ICD-10-CM | POA: Diagnosis not present

## 2015-03-20 ENCOUNTER — Encounter: Payer: Self-pay | Admitting: Interventional Cardiology

## 2015-03-20 ENCOUNTER — Ambulatory Visit (INDEPENDENT_AMBULATORY_CARE_PROVIDER_SITE_OTHER): Payer: Medicare Other | Admitting: Interventional Cardiology

## 2015-03-20 VITALS — BP 150/60 | HR 77 | Ht 68.0 in | Wt 192.0 lb

## 2015-03-20 DIAGNOSIS — I2510 Atherosclerotic heart disease of native coronary artery without angina pectoris: Secondary | ICD-10-CM

## 2015-03-20 DIAGNOSIS — R609 Edema, unspecified: Secondary | ICD-10-CM | POA: Diagnosis not present

## 2015-03-20 DIAGNOSIS — E782 Mixed hyperlipidemia: Secondary | ICD-10-CM

## 2015-03-20 NOTE — Progress Notes (Signed)
Patient ID: Ryan Pacheco, male   DOB: 1919-12-09, 81 y.o.   MRN: [id number]     Cardiology Office Note   Date:  03/20/2015   ID:  Ryan Pacheco, DOB 11/05/1919, MRN [id number]  PCP:  [name], MD    No chief complaint on file. f/u CAD   Wt Readings from Last 3 Encounters:  03/20/15 192 lb (87.091 kg)  06/13/14 184 lb (83.462 kg)  11/10/13 184 lb (83.462 kg)       History of Present Illness: Ryan Pacheco is a 81 y.o. male  who had CABG in Jan 2013 . No exertional chest pain. Chest soreness resolved. He has not had any sx like he had with ischemia. He walks around his house and yard for exercise. Limits going out because of diuretic. He is not having excessive fatigue. Occasional h/a relieved with tylenol.  CAD/ASCVD:  Denies : Chest pain.  Dizziness.  Nitroglycerin.  Orthopnea.  Palpitations.  Syncope.   He reports leg edema.  He skips the diuretic some days.  He is careful to avoid falls.  He is using a cane frequently. He is not checking his BP at hoe.  Daughter can do this for him.  He still drives.   Past Medical History  Diagnosis Date  . Edema   . Acid reflux   . Benign prostatic hypertrophy   . Bradycardia   . Embolism - blood clot ~ 2000    "behind left knee"  . History of asbestos exposure     "have some lung disease from years of exposure"  . Angina   . Myocardial infarction 11/17/10     NSTEMI  . Arthritis 01-09-12    hands, foot  . Prostate hypertrophy 01-09-12    urinary retention-Foley catheter at present; surgery planned  . Diabetes mellitus     Past Surgical History  Procedure Laterality Date  . Cholecystectomy  1980's  . Cataract extraction, bilateral  ~ 2010  . Blepharoplasty      left eye  . Coronary artery bypass graft  11/26/2011    Procedure:x4- CORONARY ARTERY BYPASS GRAFTING (CABG);  Surgeon: Gaye Pollack, MD;  Location: [facility];  Service: Open Heart Surgery;  Laterality: N/A;  . Cardiac catheterization  01-09-12   1'13  . Left heart catheterization with coronary angiogram N/A 11/20/2011    Procedure: LEFT HEART CATHETERIZATION WITH CORONARY ANGIOGRAM;  Surgeon: Jettie Booze, MD;  Location: [facility] CATH LAB;  Service: Cardiovascular;  Laterality: N/A;  possible PCI     Current Outpatient Prescriptions  Medication Sig Dispense Refill  . aspirin 325 MG tablet Take 325 mg by mouth every 4 (four) hours as needed.    Marland Kitchen atorvastatin (LIPITOR) 20 MG tablet TAKE 1/2 TABLET BY MOUTH DAILY 45 tablet 1  . CALCIUM-VITAMIN D PO Take 600 mg by mouth daily.    . ferrous sulfate 325 (65 FE) MG tablet Take 325 mg by mouth daily with breakfast.     . furosemide (LASIX) 40 MG tablet Take 40 mg by mouth daily after breakfast.     . methylcellulose (ARTIFICIAL TEARS) 1 % ophthalmic solution Place 2 drops into both eyes 4 (four) times daily.    . Multiple Vitamin (MULITIVITAMIN WITH MINERALS) TABS Take 1 tablet by mouth daily.    . pantoprazole (PROTONIX) 40 MG tablet Take 40 mg by mouth daily.    . potassium chloride SA (K-DUR,KLOR-CON) 20 MEQ tablet Take 2 tablets (40 mEq total) by mouth  daily.     No current facility-administered medications for this visit.    Allergies:   Review of patient's allergies indicates no known allergies.    Social History:  The patient  reports that he has quit smoking. His smoking use included Cigars. He has quit using smokeless tobacco. His smokeless tobacco use included Chew. He reports that he does not drink alcohol or use illicit drugs.   Family History:  The patient's *family history includes Heart attack in his father.    ROS:  Please see the history of present illness.   Otherwise, review of systems are positive for poor balance.   All other systems are reviewed and negative.    PHYSICAL EXAM: VS:  BP 150/60 mmHg  Pulse 77  Ht 5\' 8"  (1.727 m)  Wt 192 lb (87.091 kg)  BMI 29.20 kg/m2 , BMI Body mass index is 29.2 kg/(m^2). GEN: Well nourished, well developed, in no acute  distress HEENT: normal Neck: no JVD, carotid bruits, or masses Cardiac: *RRR; no murmurs, rubs, or gallops,no edema  Respiratory:  clear to auscultation bilaterally, normal work of breathing GI: soft, nontender, nondistended, + BS MS: no deformity or atrophy Skin: warm and dry, no rash Neuro:  Strength and sensation are intact Psych: euthymic mood, full affect   EKG:   The ekg ordered today demonstrates NSR, NSST, PVCs   Recent Labs: 06/15/2014: ALT 17   Lipid Panel    Component Value Date/Time   CHOL 124 06/15/2014 0921   TRIG 100.0 06/15/2014 0921   HDL 52.50 06/15/2014 0921   CHOLHDL 2 06/15/2014 0921   VLDL 20.0 06/15/2014 0921   LDLCALC 52 06/15/2014 0921     Other studies Reviewed: Additional studies/ records that were reviewed today with results demonstrating: prior ECG reviewed; no significant change.   ASSESSMENT AND PLAN:  Coronary atherosclerosis of native coronary artery  Notes: No angina.  s/p CABG.  Had NSTEMI. Surgery was in 1/13.  HAs done very well since that time.   2. Pure hypercholesterolemia  Continue Atorvastatin Calcium Tablet, 10 MG, 1 tablet, Orally, Once a day, 90 day(s), 90, Refills 3 Notes: Change to generic for cost savings. LDL target is 70. cholesterol very low. continue decreased atorvastatin 10 mg daily. LDL 53 in 6/14. Obtain most recent labs. 3. Myocardial infarction, anterior wall, subsequent care  Notes: No CHF sx. Bradycardia limiting beta blocker use. HR better off of metoprolol.  4. Bilateral leg edema  Notes: Chronic. Likely from venous insufficiency and prior DVT. OK to Increase Furosemide (fluid pill) to twice a day for a few days to help with the swelling if needed. Elevate legs at night to reduce swelling. Normal Cr in 2/15. Obtain most recent labs.  Avoid falls.   He was counselled to use his cane.  He only drives short distances.  Current medicines are reviewed at length with the patient today.  The patient concerns  regarding his medicines were addressed.  The following changes have been made:  No change  Labs/ tests ordered today include: obtain [facility] No orders of the defined types were placed in this encounter.    Recommend 150 minutes/week of aerobic exercise, avoid falling, use cane Low fat, low carb, high fiber diet recommended  Disposition:   FU in 1 year   Teresita Madura., MD  03/20/2015 10:38 AM    [facility] [address], [place], [place]  [address] Phone: [phone]; Fax: [phone]

## 2015-03-20 NOTE — Patient Instructions (Signed)
Medication Instructions:  Same  Labwork: None  Testing/Procedures: None  Follow-Up: Your physician wants you to follow-up in: 1 year. You will receive a reminder letter in the mail two months in advance. If you don't receive a letter, please call our office to schedule the follow-up appointment.      

## 2015-03-22 DIAGNOSIS — Z85828 Personal history of other malignant neoplasm of skin: Secondary | ICD-10-CM | POA: Diagnosis not present

## 2015-03-22 DIAGNOSIS — D485 Neoplasm of uncertain behavior of skin: Secondary | ICD-10-CM | POA: Diagnosis not present

## 2015-03-22 DIAGNOSIS — D044 Carcinoma in situ of skin of scalp and neck: Secondary | ICD-10-CM | POA: Diagnosis not present

## 2015-03-22 DIAGNOSIS — L821 Other seborrheic keratosis: Secondary | ICD-10-CM | POA: Diagnosis not present

## 2015-03-22 DIAGNOSIS — C4442 Squamous cell carcinoma of skin of scalp and neck: Secondary | ICD-10-CM | POA: Diagnosis not present

## 2015-04-10 DIAGNOSIS — Z85828 Personal history of other malignant neoplasm of skin: Secondary | ICD-10-CM | POA: Diagnosis not present

## 2015-04-10 DIAGNOSIS — C4441 Basal cell carcinoma of skin of scalp and neck: Secondary | ICD-10-CM | POA: Diagnosis not present

## 2015-04-10 DIAGNOSIS — C4442 Squamous cell carcinoma of skin of scalp and neck: Secondary | ICD-10-CM | POA: Diagnosis not present

## 2015-05-08 DIAGNOSIS — I252 Old myocardial infarction: Secondary | ICD-10-CM | POA: Diagnosis not present

## 2015-05-08 DIAGNOSIS — N183 Chronic kidney disease, stage 3 (moderate): Secondary | ICD-10-CM | POA: Diagnosis not present

## 2015-05-08 DIAGNOSIS — K219 Gastro-esophageal reflux disease without esophagitis: Secondary | ICD-10-CM | POA: Diagnosis not present

## 2015-05-08 DIAGNOSIS — Z951 Presence of aortocoronary bypass graft: Secondary | ICD-10-CM | POA: Diagnosis not present

## 2015-05-08 DIAGNOSIS — R609 Edema, unspecified: Secondary | ICD-10-CM | POA: Diagnosis not present

## 2015-05-08 DIAGNOSIS — E785 Hyperlipidemia, unspecified: Secondary | ICD-10-CM | POA: Diagnosis not present

## 2015-05-08 DIAGNOSIS — I10 Essential (primary) hypertension: Secondary | ICD-10-CM | POA: Diagnosis not present

## 2015-05-15 ENCOUNTER — Telehealth: Payer: Self-pay | Admitting: Interventional Cardiology

## 2015-05-15 NOTE — Telephone Encounter (Signed)
Called pt and got verbal permission to speak with daughter [name] or son in law [place]. Spoke with daughter and she states that pt was seen by Dr. Moreen Fowler last week and feels that pt is no longer responding to Lasix 40mg  BID. Daughter states pt has BLE edema. Daughter states BP is usually good when she checks it at home.  No values available. Denies CP, lightheadedness, dizziness and adding salts to food. Daughter states pt does SHOB occasionally when he is exerting. Daughter states that pt isnt getting out as much as he use. Daughter does say that pt props his feet up when he is in his recliner with not much improvement in swelling. Will forward to Dr. Irish Lack for review and advisement.

## 2015-05-15 NOTE — Telephone Encounter (Signed)
Would try some below the knee compression socks, 15-20 mm Hg.  WOuld like to avoid increasing diuretic dose.

## 2015-05-15 NOTE — Telephone Encounter (Signed)
Pt c/o swelling: STAT is pt has developed SOB within 24 hours  1. How long have you been experiencing swelling? 2 Months  2. Where is the swelling located? Both Legs  3.  Are you currently taking a "fluid pill"? Yes  4.  Are you currently SOB? No  5.  Have you traveled recently? No

## 2015-05-15 NOTE — Telephone Encounter (Signed)
Spoke with daughter and informed her that Dr. Irish Lack would like for pt to try knee compression socks 15-77mm Hg and that he would like to avoid increasing diuretic dose. Daughter verbalized understanding and was in agreement with this plan.

## 2015-06-16 ENCOUNTER — Other Ambulatory Visit: Payer: Medicare Other

## 2015-06-20 DIAGNOSIS — R972 Elevated prostate specific antigen [PSA]: Secondary | ICD-10-CM | POA: Diagnosis not present

## 2015-06-20 DIAGNOSIS — R350 Frequency of micturition: Secondary | ICD-10-CM | POA: Diagnosis not present

## 2015-06-20 DIAGNOSIS — N3000 Acute cystitis without hematuria: Secondary | ICD-10-CM | POA: Diagnosis not present

## 2015-06-22 ENCOUNTER — Other Ambulatory Visit: Payer: Self-pay | Admitting: Interventional Cardiology

## 2015-07-12 ENCOUNTER — Ambulatory Visit
Admission: RE | Admit: 2015-07-12 | Discharge: 2015-07-12 | Disposition: A | Discharge status: Home / Self Care | Payer: Medicare Other | Source: Ambulatory Visit | Attending: Family Medicine | Admitting: Family Medicine

## 2015-07-12 ENCOUNTER — Other Ambulatory Visit: Payer: Self-pay | Admitting: Family Medicine

## 2015-07-12 DIAGNOSIS — M79604 Pain in right leg: Secondary | ICD-10-CM | POA: Diagnosis not present

## 2015-07-12 DIAGNOSIS — Z86718 Personal history of other venous thrombosis and embolism: Secondary | ICD-10-CM

## 2015-07-12 DIAGNOSIS — M7989 Other specified soft tissue disorders: Secondary | ICD-10-CM | POA: Diagnosis not present

## 2015-07-12 DIAGNOSIS — M79661 Pain in right lower leg: Secondary | ICD-10-CM | POA: Diagnosis not present

## 2015-07-12 DIAGNOSIS — R52 Pain, unspecified: Secondary | ICD-10-CM

## 2015-07-12 DIAGNOSIS — I10 Essential (primary) hypertension: Secondary | ICD-10-CM | POA: Diagnosis not present

## 2015-07-12 DIAGNOSIS — R609 Edema, unspecified: Secondary | ICD-10-CM | POA: Diagnosis not present

## 2015-08-01 DIAGNOSIS — M79604 Pain in right leg: Secondary | ICD-10-CM | POA: Diagnosis not present

## 2015-08-08 DIAGNOSIS — M79604 Pain in right leg: Secondary | ICD-10-CM | POA: Diagnosis not present

## 2015-08-15 DIAGNOSIS — M79604 Pain in right leg: Secondary | ICD-10-CM | POA: Diagnosis not present

## 2015-08-15 DIAGNOSIS — R600 Localized edema: Secondary | ICD-10-CM | POA: Diagnosis not present

## 2015-09-11 DIAGNOSIS — Z23 Encounter for immunization: Secondary | ICD-10-CM | POA: Diagnosis not present

## 2015-10-09 DIAGNOSIS — L720 Epidermal cyst: Secondary | ICD-10-CM | POA: Diagnosis not present

## 2015-10-09 DIAGNOSIS — L821 Other seborrheic keratosis: Secondary | ICD-10-CM | POA: Diagnosis not present

## 2015-10-09 DIAGNOSIS — Z85828 Personal history of other malignant neoplasm of skin: Secondary | ICD-10-CM | POA: Diagnosis not present

## 2015-11-02 DIAGNOSIS — H6120 Impacted cerumen, unspecified ear: Secondary | ICD-10-CM | POA: Diagnosis not present

## 2015-11-08 DIAGNOSIS — Z0000 Encounter for general adult medical examination without abnormal findings: Secondary | ICD-10-CM | POA: Diagnosis not present

## 2015-11-08 DIAGNOSIS — N400 Enlarged prostate without lower urinary tract symptoms: Secondary | ICD-10-CM | POA: Diagnosis not present

## 2015-11-08 DIAGNOSIS — Z111 Encounter for screening for respiratory tuberculosis: Secondary | ICD-10-CM | POA: Diagnosis not present

## 2015-11-08 DIAGNOSIS — Z23 Encounter for immunization: Secondary | ICD-10-CM | POA: Diagnosis not present

## 2015-11-08 DIAGNOSIS — E7800 Pure hypercholesterolemia, unspecified: Secondary | ICD-10-CM | POA: Diagnosis not present

## 2015-11-08 DIAGNOSIS — K219 Gastro-esophageal reflux disease without esophagitis: Secondary | ICD-10-CM | POA: Diagnosis not present

## 2015-11-08 DIAGNOSIS — S50319A Abrasion of unspecified elbow, initial encounter: Secondary | ICD-10-CM | POA: Diagnosis not present

## 2015-11-08 DIAGNOSIS — R296 Repeated falls: Secondary | ICD-10-CM | POA: Diagnosis not present

## 2015-11-08 DIAGNOSIS — R609 Edema, unspecified: Secondary | ICD-10-CM | POA: Diagnosis not present

## 2015-11-08 DIAGNOSIS — Z1389 Encounter for screening for other disorder: Secondary | ICD-10-CM | POA: Diagnosis not present

## 2015-11-14 DIAGNOSIS — S80211D Abrasion, right knee, subsequent encounter: Secondary | ICD-10-CM | POA: Diagnosis not present

## 2015-11-14 DIAGNOSIS — S80211A Abrasion, right knee, initial encounter: Secondary | ICD-10-CM | POA: Diagnosis not present

## 2015-11-14 DIAGNOSIS — L89222 Pressure ulcer of left hip, stage 2: Secondary | ICD-10-CM | POA: Diagnosis not present

## 2015-11-14 DIAGNOSIS — R296 Repeated falls: Secondary | ICD-10-CM | POA: Diagnosis not present

## 2015-11-14 DIAGNOSIS — Z86718 Personal history of other venous thrombosis and embolism: Secondary | ICD-10-CM | POA: Diagnosis not present

## 2015-11-14 DIAGNOSIS — Z7982 Long term (current) use of aspirin: Secondary | ICD-10-CM | POA: Diagnosis not present

## 2015-11-14 DIAGNOSIS — L89210 Pressure ulcer of right hip, unstageable: Secondary | ICD-10-CM | POA: Diagnosis not present

## 2015-11-14 DIAGNOSIS — W19XXXD Unspecified fall, subsequent encounter: Secondary | ICD-10-CM | POA: Diagnosis not present

## 2015-11-14 DIAGNOSIS — L89120 Pressure ulcer of left upper back, unstageable: Secondary | ICD-10-CM | POA: Diagnosis not present

## 2015-11-14 DIAGNOSIS — S50312D Abrasion of left elbow, subsequent encounter: Secondary | ICD-10-CM | POA: Diagnosis not present

## 2015-11-14 DIAGNOSIS — S80212A Abrasion, left knee, initial encounter: Secondary | ICD-10-CM | POA: Diagnosis not present

## 2015-11-14 DIAGNOSIS — N400 Enlarged prostate without lower urinary tract symptoms: Secondary | ICD-10-CM | POA: Diagnosis not present

## 2015-11-14 DIAGNOSIS — S80212D Abrasion, left knee, subsequent encounter: Secondary | ICD-10-CM | POA: Diagnosis not present

## 2015-11-15 DIAGNOSIS — L89120 Pressure ulcer of left upper back, unstageable: Secondary | ICD-10-CM | POA: Diagnosis not present

## 2015-11-15 DIAGNOSIS — W19XXXD Unspecified fall, subsequent encounter: Secondary | ICD-10-CM | POA: Diagnosis not present

## 2015-11-15 DIAGNOSIS — L89222 Pressure ulcer of left hip, stage 2: Secondary | ICD-10-CM | POA: Diagnosis not present

## 2015-11-15 DIAGNOSIS — S80211D Abrasion, right knee, subsequent encounter: Secondary | ICD-10-CM | POA: Diagnosis not present

## 2015-11-15 DIAGNOSIS — S80212D Abrasion, left knee, subsequent encounter: Secondary | ICD-10-CM | POA: Diagnosis not present

## 2015-11-15 DIAGNOSIS — L89210 Pressure ulcer of right hip, unstageable: Secondary | ICD-10-CM | POA: Diagnosis not present

## 2015-11-16 DIAGNOSIS — S80211D Abrasion, right knee, subsequent encounter: Secondary | ICD-10-CM | POA: Diagnosis not present

## 2015-11-16 DIAGNOSIS — L89222 Pressure ulcer of left hip, stage 2: Secondary | ICD-10-CM | POA: Diagnosis not present

## 2015-11-16 DIAGNOSIS — S80212D Abrasion, left knee, subsequent encounter: Secondary | ICD-10-CM | POA: Diagnosis not present

## 2015-11-16 DIAGNOSIS — L89210 Pressure ulcer of right hip, unstageable: Secondary | ICD-10-CM | POA: Diagnosis not present

## 2015-11-16 DIAGNOSIS — L89120 Pressure ulcer of left upper back, unstageable: Secondary | ICD-10-CM | POA: Diagnosis not present

## 2015-11-16 DIAGNOSIS — W19XXXD Unspecified fall, subsequent encounter: Secondary | ICD-10-CM | POA: Diagnosis not present

## 2015-11-17 DIAGNOSIS — S80212D Abrasion, left knee, subsequent encounter: Secondary | ICD-10-CM | POA: Diagnosis not present

## 2015-11-17 DIAGNOSIS — L89210 Pressure ulcer of right hip, unstageable: Secondary | ICD-10-CM | POA: Diagnosis not present

## 2015-11-17 DIAGNOSIS — L89222 Pressure ulcer of left hip, stage 2: Secondary | ICD-10-CM | POA: Diagnosis not present

## 2015-11-17 DIAGNOSIS — L89120 Pressure ulcer of left upper back, unstageable: Secondary | ICD-10-CM | POA: Diagnosis not present

## 2015-11-17 DIAGNOSIS — W19XXXD Unspecified fall, subsequent encounter: Secondary | ICD-10-CM | POA: Diagnosis not present

## 2015-11-17 DIAGNOSIS — S80211D Abrasion, right knee, subsequent encounter: Secondary | ICD-10-CM | POA: Diagnosis not present

## 2015-11-19 DIAGNOSIS — L89120 Pressure ulcer of left upper back, unstageable: Secondary | ICD-10-CM | POA: Diagnosis not present

## 2015-11-19 DIAGNOSIS — W19XXXD Unspecified fall, subsequent encounter: Secondary | ICD-10-CM | POA: Diagnosis not present

## 2015-11-19 DIAGNOSIS — L89222 Pressure ulcer of left hip, stage 2: Secondary | ICD-10-CM | POA: Diagnosis not present

## 2015-11-19 DIAGNOSIS — L89210 Pressure ulcer of right hip, unstageable: Secondary | ICD-10-CM | POA: Diagnosis not present

## 2015-11-19 DIAGNOSIS — S80212D Abrasion, left knee, subsequent encounter: Secondary | ICD-10-CM | POA: Diagnosis not present

## 2015-11-19 DIAGNOSIS — S80211D Abrasion, right knee, subsequent encounter: Secondary | ICD-10-CM | POA: Diagnosis not present

## 2015-11-21 DIAGNOSIS — L89222 Pressure ulcer of left hip, stage 2: Secondary | ICD-10-CM | POA: Diagnosis not present

## 2015-11-21 DIAGNOSIS — L89120 Pressure ulcer of left upper back, unstageable: Secondary | ICD-10-CM | POA: Diagnosis not present

## 2015-11-21 DIAGNOSIS — L89210 Pressure ulcer of right hip, unstageable: Secondary | ICD-10-CM | POA: Diagnosis not present

## 2015-11-21 DIAGNOSIS — S80212D Abrasion, left knee, subsequent encounter: Secondary | ICD-10-CM | POA: Diagnosis not present

## 2015-11-21 DIAGNOSIS — W19XXXD Unspecified fall, subsequent encounter: Secondary | ICD-10-CM | POA: Diagnosis not present

## 2015-11-21 DIAGNOSIS — S80211D Abrasion, right knee, subsequent encounter: Secondary | ICD-10-CM | POA: Diagnosis not present

## 2015-11-22 DIAGNOSIS — L89120 Pressure ulcer of left upper back, unstageable: Secondary | ICD-10-CM | POA: Diagnosis not present

## 2015-11-22 DIAGNOSIS — S80211D Abrasion, right knee, subsequent encounter: Secondary | ICD-10-CM | POA: Diagnosis not present

## 2015-11-22 DIAGNOSIS — L89210 Pressure ulcer of right hip, unstageable: Secondary | ICD-10-CM | POA: Diagnosis not present

## 2015-11-22 DIAGNOSIS — L89222 Pressure ulcer of left hip, stage 2: Secondary | ICD-10-CM | POA: Diagnosis not present

## 2015-11-22 DIAGNOSIS — W19XXXD Unspecified fall, subsequent encounter: Secondary | ICD-10-CM | POA: Diagnosis not present

## 2015-11-22 DIAGNOSIS — S80212D Abrasion, left knee, subsequent encounter: Secondary | ICD-10-CM | POA: Diagnosis not present

## 2015-11-26 DIAGNOSIS — L89120 Pressure ulcer of left upper back, unstageable: Secondary | ICD-10-CM | POA: Diagnosis not present

## 2015-11-26 DIAGNOSIS — W19XXXD Unspecified fall, subsequent encounter: Secondary | ICD-10-CM | POA: Diagnosis not present

## 2015-11-26 DIAGNOSIS — S80211D Abrasion, right knee, subsequent encounter: Secondary | ICD-10-CM | POA: Diagnosis not present

## 2015-11-26 DIAGNOSIS — S80212D Abrasion, left knee, subsequent encounter: Secondary | ICD-10-CM | POA: Diagnosis not present

## 2015-11-26 DIAGNOSIS — L89210 Pressure ulcer of right hip, unstageable: Secondary | ICD-10-CM | POA: Diagnosis not present

## 2015-11-26 DIAGNOSIS — L89222 Pressure ulcer of left hip, stage 2: Secondary | ICD-10-CM | POA: Diagnosis not present

## 2015-11-29 DIAGNOSIS — W19XXXD Unspecified fall, subsequent encounter: Secondary | ICD-10-CM | POA: Diagnosis not present

## 2015-11-29 DIAGNOSIS — L89120 Pressure ulcer of left upper back, unstageable: Secondary | ICD-10-CM | POA: Diagnosis not present

## 2015-11-29 DIAGNOSIS — S80211D Abrasion, right knee, subsequent encounter: Secondary | ICD-10-CM | POA: Diagnosis not present

## 2015-11-29 DIAGNOSIS — S80212D Abrasion, left knee, subsequent encounter: Secondary | ICD-10-CM | POA: Diagnosis not present

## 2015-11-29 DIAGNOSIS — L89222 Pressure ulcer of left hip, stage 2: Secondary | ICD-10-CM | POA: Diagnosis not present

## 2015-11-29 DIAGNOSIS — L89210 Pressure ulcer of right hip, unstageable: Secondary | ICD-10-CM | POA: Diagnosis not present

## 2015-11-30 DIAGNOSIS — S80211D Abrasion, right knee, subsequent encounter: Secondary | ICD-10-CM | POA: Diagnosis not present

## 2015-11-30 DIAGNOSIS — W19XXXD Unspecified fall, subsequent encounter: Secondary | ICD-10-CM | POA: Diagnosis not present

## 2015-11-30 DIAGNOSIS — S80212D Abrasion, left knee, subsequent encounter: Secondary | ICD-10-CM | POA: Diagnosis not present

## 2015-11-30 DIAGNOSIS — L89120 Pressure ulcer of left upper back, unstageable: Secondary | ICD-10-CM | POA: Diagnosis not present

## 2015-11-30 DIAGNOSIS — L89210 Pressure ulcer of right hip, unstageable: Secondary | ICD-10-CM | POA: Diagnosis not present

## 2015-11-30 DIAGNOSIS — L89222 Pressure ulcer of left hip, stage 2: Secondary | ICD-10-CM | POA: Diagnosis not present

## 2015-12-01 DIAGNOSIS — S80211D Abrasion, right knee, subsequent encounter: Secondary | ICD-10-CM | POA: Diagnosis not present

## 2015-12-01 DIAGNOSIS — L89222 Pressure ulcer of left hip, stage 2: Secondary | ICD-10-CM | POA: Diagnosis not present

## 2015-12-01 DIAGNOSIS — S80212D Abrasion, left knee, subsequent encounter: Secondary | ICD-10-CM | POA: Diagnosis not present

## 2015-12-01 DIAGNOSIS — W19XXXD Unspecified fall, subsequent encounter: Secondary | ICD-10-CM | POA: Diagnosis not present

## 2015-12-01 DIAGNOSIS — L89210 Pressure ulcer of right hip, unstageable: Secondary | ICD-10-CM | POA: Diagnosis not present

## 2015-12-01 DIAGNOSIS — L89120 Pressure ulcer of left upper back, unstageable: Secondary | ICD-10-CM | POA: Diagnosis not present

## 2015-12-03 DIAGNOSIS — L89222 Pressure ulcer of left hip, stage 2: Secondary | ICD-10-CM | POA: Diagnosis not present

## 2015-12-03 DIAGNOSIS — L89120 Pressure ulcer of left upper back, unstageable: Secondary | ICD-10-CM | POA: Diagnosis not present

## 2015-12-03 DIAGNOSIS — S80212D Abrasion, left knee, subsequent encounter: Secondary | ICD-10-CM | POA: Diagnosis not present

## 2015-12-03 DIAGNOSIS — W19XXXD Unspecified fall, subsequent encounter: Secondary | ICD-10-CM | POA: Diagnosis not present

## 2015-12-03 DIAGNOSIS — L89210 Pressure ulcer of right hip, unstageable: Secondary | ICD-10-CM | POA: Diagnosis not present

## 2015-12-03 DIAGNOSIS — S80211D Abrasion, right knee, subsequent encounter: Secondary | ICD-10-CM | POA: Diagnosis not present

## 2015-12-04 DIAGNOSIS — W19XXXD Unspecified fall, subsequent encounter: Secondary | ICD-10-CM | POA: Diagnosis not present

## 2015-12-04 DIAGNOSIS — L89120 Pressure ulcer of left upper back, unstageable: Secondary | ICD-10-CM | POA: Diagnosis not present

## 2015-12-04 DIAGNOSIS — S80211D Abrasion, right knee, subsequent encounter: Secondary | ICD-10-CM | POA: Diagnosis not present

## 2015-12-04 DIAGNOSIS — L89222 Pressure ulcer of left hip, stage 2: Secondary | ICD-10-CM | POA: Diagnosis not present

## 2015-12-04 DIAGNOSIS — S80212D Abrasion, left knee, subsequent encounter: Secondary | ICD-10-CM | POA: Diagnosis not present

## 2015-12-04 DIAGNOSIS — L89210 Pressure ulcer of right hip, unstageable: Secondary | ICD-10-CM | POA: Diagnosis not present

## 2015-12-05 DIAGNOSIS — L89210 Pressure ulcer of right hip, unstageable: Secondary | ICD-10-CM | POA: Diagnosis not present

## 2015-12-05 DIAGNOSIS — L89222 Pressure ulcer of left hip, stage 2: Secondary | ICD-10-CM | POA: Diagnosis not present

## 2015-12-05 DIAGNOSIS — L89120 Pressure ulcer of left upper back, unstageable: Secondary | ICD-10-CM | POA: Diagnosis not present

## 2015-12-05 DIAGNOSIS — S80211D Abrasion, right knee, subsequent encounter: Secondary | ICD-10-CM | POA: Diagnosis not present

## 2015-12-05 DIAGNOSIS — W19XXXD Unspecified fall, subsequent encounter: Secondary | ICD-10-CM | POA: Diagnosis not present

## 2015-12-05 DIAGNOSIS — S80212D Abrasion, left knee, subsequent encounter: Secondary | ICD-10-CM | POA: Diagnosis not present

## 2015-12-06 DIAGNOSIS — L89210 Pressure ulcer of right hip, unstageable: Secondary | ICD-10-CM | POA: Diagnosis not present

## 2015-12-06 DIAGNOSIS — S80211D Abrasion, right knee, subsequent encounter: Secondary | ICD-10-CM | POA: Diagnosis not present

## 2015-12-06 DIAGNOSIS — L89222 Pressure ulcer of left hip, stage 2: Secondary | ICD-10-CM | POA: Diagnosis not present

## 2015-12-06 DIAGNOSIS — L89120 Pressure ulcer of left upper back, unstageable: Secondary | ICD-10-CM | POA: Diagnosis not present

## 2015-12-06 DIAGNOSIS — W19XXXD Unspecified fall, subsequent encounter: Secondary | ICD-10-CM | POA: Diagnosis not present

## 2015-12-06 DIAGNOSIS — S80212D Abrasion, left knee, subsequent encounter: Secondary | ICD-10-CM | POA: Diagnosis not present

## 2015-12-11 DIAGNOSIS — L89120 Pressure ulcer of left upper back, unstageable: Secondary | ICD-10-CM | POA: Diagnosis not present

## 2015-12-11 DIAGNOSIS — S80212D Abrasion, left knee, subsequent encounter: Secondary | ICD-10-CM | POA: Diagnosis not present

## 2015-12-11 DIAGNOSIS — W19XXXD Unspecified fall, subsequent encounter: Secondary | ICD-10-CM | POA: Diagnosis not present

## 2015-12-11 DIAGNOSIS — S80211D Abrasion, right knee, subsequent encounter: Secondary | ICD-10-CM | POA: Diagnosis not present

## 2015-12-11 DIAGNOSIS — Z79899 Other long term (current) drug therapy: Secondary | ICD-10-CM | POA: Diagnosis not present

## 2015-12-11 DIAGNOSIS — L89210 Pressure ulcer of right hip, unstageable: Secondary | ICD-10-CM | POA: Diagnosis not present

## 2015-12-11 DIAGNOSIS — L89222 Pressure ulcer of left hip, stage 2: Secondary | ICD-10-CM | POA: Diagnosis not present

## 2015-12-13 DIAGNOSIS — W19XXXD Unspecified fall, subsequent encounter: Secondary | ICD-10-CM | POA: Diagnosis not present

## 2015-12-13 DIAGNOSIS — S80211D Abrasion, right knee, subsequent encounter: Secondary | ICD-10-CM | POA: Diagnosis not present

## 2015-12-13 DIAGNOSIS — L89120 Pressure ulcer of left upper back, unstageable: Secondary | ICD-10-CM | POA: Diagnosis not present

## 2015-12-13 DIAGNOSIS — L89210 Pressure ulcer of right hip, unstageable: Secondary | ICD-10-CM | POA: Diagnosis not present

## 2015-12-13 DIAGNOSIS — L89222 Pressure ulcer of left hip, stage 2: Secondary | ICD-10-CM | POA: Diagnosis not present

## 2015-12-13 DIAGNOSIS — S80212D Abrasion, left knee, subsequent encounter: Secondary | ICD-10-CM | POA: Diagnosis not present

## 2016-03-19 ENCOUNTER — Encounter: Payer: Self-pay | Admitting: Interventional Cardiology

## 2016-03-19 ENCOUNTER — Ambulatory Visit (INDEPENDENT_AMBULATORY_CARE_PROVIDER_SITE_OTHER): Payer: Medicare Other | Admitting: Interventional Cardiology

## 2016-03-19 VITALS — BP 120/60 | HR 60 | Ht 68.0 in | Wt 169.0 lb

## 2016-03-19 DIAGNOSIS — I252 Old myocardial infarction: Secondary | ICD-10-CM | POA: Diagnosis not present

## 2016-03-19 DIAGNOSIS — E782 Mixed hyperlipidemia: Secondary | ICD-10-CM | POA: Diagnosis not present

## 2016-03-19 DIAGNOSIS — I2510 Atherosclerotic heart disease of native coronary artery without angina pectoris: Secondary | ICD-10-CM | POA: Diagnosis not present

## 2016-03-19 DIAGNOSIS — R0989 Other specified symptoms and signs involving the circulatory and respiratory systems: Secondary | ICD-10-CM

## 2016-03-19 DIAGNOSIS — R609 Edema, unspecified: Secondary | ICD-10-CM

## 2016-03-19 NOTE — Patient Instructions (Signed)
Medication Instructions:  Same-no changes. You may take extra Lasix dose up to 2 times a week as needed for edema.  Labwork: None  Testing/Procedures: Your physician has requested that you have a carotid duplex. This test is an ultrasound of the carotid arteries in your neck. It looks at blood flow through these arteries that supply the brain with blood. Allow one hour for this exam. There are no restrictions or special instructions.   Follow-Up: Your physician wants you to follow-up in: 1 year. You will receive a reminder letter in the mail two months in advance. If you don't receive a letter, please call our office to schedule the follow-up appointment.     If you need a refill on your cardiac medications before your next appointment, please call your pharmacy.

## 2016-03-19 NOTE — Progress Notes (Signed)
Patient ID: Ryan Pacheco, male   DOB: 1920-09-21, 82 y.o.   MRN: [id number]     Cardiology Office Note   Date:  03/19/2016   ID:  Ryan Pacheco, DOB 06-30-1920, MRN [id number]  PCP:  [name], MD    No chief complaint on file.    Wt Readings from Last 3 Encounters:  03/19/16 169 lb (76.658 kg)  03/20/15 192 lb (87.091 kg)  06/13/14 184 lb (83.462 kg)       History of Present Illness: Ryan Pacheco is a 82 y.o. male  who had CABG in Jan 2013 . No exertional chest pain.  He has not had any sx like he had with ischemia. Walking is more limited.  He had to move to an assisted living facility.He fell a few months ago. He is not having excessive fatigue. Occasional h/a relieved with tylenol.  CAD/ASCVD:  Denies : Chest pain.  Dizziness.  Nitroglycerin.  Orthopnea.  Palpitations.  Syncope.   He reports leg edema. He skips the diuretic some days. He is careful to avoid falls. He is using a walker frequently. He is not checking his BP at home. Daughter can do this for him. He no longer drives, but still has a license.  He feels some extra fluid some times. It shows up as swelling.      Past Medical History  Diagnosis Date  . Edema   . Acid reflux   . Benign prostatic hypertrophy   . Bradycardia   . Embolism - blood clot ~ 2000    "behind left knee"  . History of asbestos exposure     "have some lung disease from years of exposure"  . Angina   . Myocardial infarction ([facility]) 11/17/10     NSTEMI  . Arthritis 01-09-12    hands, foot  . Prostate hypertrophy 01-09-12    urinary retention-Foley catheter at present; surgery planned  . Diabetes mellitus     Past Surgical History  Procedure Laterality Date  . Cholecystectomy  1980's  . Cataract extraction, bilateral  ~ 2010  . Blepharoplasty      left eye  . Coronary artery bypass graft  11/26/2011    Procedure:x4- CORONARY ARTERY BYPASS GRAFTING (CABG);  Surgeon: Gaye Pollack, MD;  Location: [facility];   Service: Open Heart Surgery;  Laterality: N/A;  . Cardiac catheterization  01-09-12    1'13  . Left heart catheterization with coronary angiogram N/A 11/20/2011    Procedure: LEFT HEART CATHETERIZATION WITH CORONARY ANGIOGRAM;  Surgeon: Jettie Booze, MD;  Location: [facility] CATH LAB;  Service: Cardiovascular;  Laterality: N/A;  possible PCI     Current Outpatient Prescriptions  Medication Sig Dispense Refill  . aspirin 81 MG tablet Take 81 mg by mouth daily.    Marland Kitchen atorvastatin (LIPITOR) 20 MG tablet TAKE 1/2 TABLET BY MOUTH ONCE DAILY 45 tablet 8  . CALCIUM-VITAMIN D PO Take 600 mg by mouth daily.    . ferrous sulfate 325 (65 FE) MG tablet Take 325 mg by mouth daily with breakfast.     . furosemide (LASIX) 40 MG tablet Take 40 mg by mouth daily after breakfast.     . methylcellulose (ARTIFICIAL TEARS) 1 % ophthalmic solution Place 2 drops into both eyes 4 (four) times daily.    . Multiple Vitamin (MULITIVITAMIN WITH MINERALS) TABS Take 1 tablet by mouth daily.    . pantoprazole (PROTONIX) 40 MG tablet Take 40 mg by mouth daily.    Marland Kitchen  potassium chloride SA (K-DUR,KLOR-CON) 20 MEQ tablet Take 2 tablets (40 mEq total) by mouth daily.    . tamsulosin (FLOMAX) 0.4 MG CAPS capsule TK 1 C PO BED  2   No current facility-administered medications for this visit.    Allergies:   Review of patient's allergies indicates no known allergies.    Social History:  The patient  reports that he has quit smoking. His smoking use included Cigars. He has quit using smokeless tobacco. His smokeless tobacco use included Chew. He reports that he does not drink alcohol or use illicit drugs.   Family History:  The patient's family history includes Heart attack in his father. There is no history of Hypertension or Stroke.    ROS:  Please see the history of present illness.   Otherwise, review of systems are positive for no bleeding; occasional swelling.   All other systems are reviewed and negative.    PHYSICAL  EXAM: VS:  BP 120/60 mmHg  Pulse 60  Ht 5\' 8"  (1.727 m)  Wt 169 lb (76.658 kg)  BMI 25.70 kg/m2 , BMI Body mass index is 25.7 kg/(m^2). GEN: Well nourished, well developed, in no acute distress, frail HEENT: normal Neck: no JVD, right carotid bruit, no masses Cardiac: RRR; 2/6 systolic murmurs, no rubs, or gallops,no edema  Respiratory:  clear to auscultation bilaterally, normal work of breathing GI: soft, nontender, nondistended, + BS MS: no deformity or atrophy Skin: warm and dry, no rash Neuro:  Strength and sensation are intact Psych: euthymic mood, full affect   EKG:   The ekg ordered today demonstrates NSR, nonspecific ST segment changes   Recent Labs: No results found for requested labs within last 365 days.   Lipid Panel    Component Value Date/Time   CHOL 124 06/15/2014 0921   TRIG 100.0 06/15/2014 0921   HDL 52.50 06/15/2014 0921   CHOLHDL 2 06/15/2014 0921   VLDL 20.0 06/15/2014 0921   LDLCALC 52 06/15/2014 0921     Other studies Reviewed: Additional studies/ records that were reviewed today with results demonstrating: prior echo in 2013.   ASSESSMENT AND PLAN:  1. CAD:  No angina. s/p CABG. Had NSTEMI. Surgery was in 1/13. Has done very well since that time. 2. Hyperlipidemia:  3. Old MI: Normla EF in 2013.  DIastolic heart familure-chronic 4. Bilateral leg edema: Recurrent.  Continue diuretics.  Can take an extra Lasix if needed twice a week. 5. Right carotid Doppler: check carotid Duplex.   Current medicines are reviewed at length with the patient today.  The patient concerns regarding his medicines were addressed.  The following changes have been made:  No change  Labs/ tests ordered today include:  Orders Placed This Encounter  Procedures  . EKG 12-Lead    Recommend 150 minutes/week of aerobic exercise Low fat, low carb, high fiber diet recommended  Disposition:   FU in 1 year   Signed, Larae Grooms, MD  03/19/2016 2:13 PM      [facility] [address], [place], [place]  [address] Phone: [phone]; Fax: [phone]

## 2016-04-03 ENCOUNTER — Ambulatory Visit (HOSPITAL_COMMUNITY)
Admission: RE | Admit: 2016-04-03 | Discharge: 2016-04-03 | Disposition: A | Discharge status: Home / Self Care | Payer: Medicare Other | Source: Ambulatory Visit | Attending: Interventional Cardiology | Admitting: Interventional Cardiology

## 2016-04-03 DIAGNOSIS — R0989 Other specified symptoms and signs involving the circulatory and respiratory systems: Secondary | ICD-10-CM | POA: Diagnosis not present

## 2016-04-03 DIAGNOSIS — E119 Type 2 diabetes mellitus without complications: Secondary | ICD-10-CM | POA: Insufficient documentation

## 2016-04-03 DIAGNOSIS — I6523 Occlusion and stenosis of bilateral carotid arteries: Secondary | ICD-10-CM | POA: Insufficient documentation

## 2016-04-09 DIAGNOSIS — L89311 Pressure ulcer of right buttock, stage 1: Secondary | ICD-10-CM | POA: Diagnosis not present

## 2016-04-09 DIAGNOSIS — M19071 Primary osteoarthritis, right ankle and foot: Secondary | ICD-10-CM | POA: Diagnosis not present

## 2016-04-09 DIAGNOSIS — L89312 Pressure ulcer of right buttock, stage 2: Secondary | ICD-10-CM | POA: Diagnosis not present

## 2016-04-09 DIAGNOSIS — M19072 Primary osteoarthritis, left ankle and foot: Secondary | ICD-10-CM | POA: Diagnosis not present

## 2016-04-09 DIAGNOSIS — E119 Type 2 diabetes mellitus without complications: Secondary | ICD-10-CM | POA: Diagnosis not present

## 2016-04-09 DIAGNOSIS — N401 Enlarged prostate with lower urinary tract symptoms: Secondary | ICD-10-CM | POA: Diagnosis not present

## 2016-04-10 DIAGNOSIS — L89312 Pressure ulcer of right buttock, stage 2: Secondary | ICD-10-CM | POA: Diagnosis not present

## 2016-04-10 DIAGNOSIS — N401 Enlarged prostate with lower urinary tract symptoms: Secondary | ICD-10-CM | POA: Diagnosis not present

## 2016-04-10 DIAGNOSIS — L89311 Pressure ulcer of right buttock, stage 1: Secondary | ICD-10-CM | POA: Diagnosis not present

## 2016-04-10 DIAGNOSIS — E119 Type 2 diabetes mellitus without complications: Secondary | ICD-10-CM | POA: Diagnosis not present

## 2016-04-10 DIAGNOSIS — M19072 Primary osteoarthritis, left ankle and foot: Secondary | ICD-10-CM | POA: Diagnosis not present

## 2016-04-10 DIAGNOSIS — M19071 Primary osteoarthritis, right ankle and foot: Secondary | ICD-10-CM | POA: Diagnosis not present

## 2016-04-12 ENCOUNTER — Emergency Department (HOSPITAL_COMMUNITY): Payer: Medicare Other

## 2016-04-12 ENCOUNTER — Encounter (HOSPITAL_COMMUNITY): Payer: Self-pay

## 2016-04-12 ENCOUNTER — Emergency Department (HOSPITAL_COMMUNITY)
Admission: EM | Admit: 2016-04-12 | Discharge: 2016-04-12 | Disposition: A | Discharge status: Home / Self Care | Payer: Medicare Other | Attending: Emergency Medicine | Admitting: Emergency Medicine

## 2016-04-12 DIAGNOSIS — Z951 Presence of aortocoronary bypass graft: Secondary | ICD-10-CM | POA: Diagnosis not present

## 2016-04-12 DIAGNOSIS — S51812A Laceration without foreign body of left forearm, initial encounter: Secondary | ICD-10-CM | POA: Insufficient documentation

## 2016-04-12 DIAGNOSIS — Y999 Unspecified external cause status: Secondary | ICD-10-CM | POA: Diagnosis not present

## 2016-04-12 DIAGNOSIS — S80211A Abrasion, right knee, initial encounter: Secondary | ICD-10-CM | POA: Diagnosis not present

## 2016-04-12 DIAGNOSIS — S0990XA Unspecified injury of head, initial encounter: Secondary | ICD-10-CM | POA: Diagnosis not present

## 2016-04-12 DIAGNOSIS — S59912A Unspecified injury of left forearm, initial encounter: Secondary | ICD-10-CM | POA: Diagnosis present

## 2016-04-12 DIAGNOSIS — S79911A Unspecified injury of right hip, initial encounter: Secondary | ICD-10-CM | POA: Diagnosis not present

## 2016-04-12 DIAGNOSIS — E119 Type 2 diabetes mellitus without complications: Secondary | ICD-10-CM | POA: Insufficient documentation

## 2016-04-12 DIAGNOSIS — Y92009 Unspecified place in unspecified non-institutional (private) residence as the place of occurrence of the external cause: Secondary | ICD-10-CM | POA: Diagnosis not present

## 2016-04-12 DIAGNOSIS — Z87891 Personal history of nicotine dependence: Secondary | ICD-10-CM | POA: Diagnosis not present

## 2016-04-12 DIAGNOSIS — Y9301 Activity, walking, marching and hiking: Secondary | ICD-10-CM | POA: Insufficient documentation

## 2016-04-12 DIAGNOSIS — Z7982 Long term (current) use of aspirin: Secondary | ICD-10-CM | POA: Insufficient documentation

## 2016-04-12 DIAGNOSIS — W1839XA Other fall on same level, initial encounter: Secondary | ICD-10-CM | POA: Diagnosis not present

## 2016-04-12 DIAGNOSIS — S199XXA Unspecified injury of neck, initial encounter: Secondary | ICD-10-CM | POA: Diagnosis not present

## 2016-04-12 DIAGNOSIS — M25559 Pain in unspecified hip: Secondary | ICD-10-CM | POA: Diagnosis not present

## 2016-04-12 DIAGNOSIS — M25561 Pain in right knee: Secondary | ICD-10-CM | POA: Diagnosis not present

## 2016-04-12 DIAGNOSIS — W19XXXA Unspecified fall, initial encounter: Secondary | ICD-10-CM

## 2016-04-12 DIAGNOSIS — M25551 Pain in right hip: Secondary | ICD-10-CM | POA: Diagnosis not present

## 2016-04-12 DIAGNOSIS — I252 Old myocardial infarction: Secondary | ICD-10-CM | POA: Diagnosis not present

## 2016-04-12 DIAGNOSIS — R51 Headache: Secondary | ICD-10-CM | POA: Diagnosis not present

## 2016-04-12 DIAGNOSIS — M542 Cervicalgia: Secondary | ICD-10-CM | POA: Diagnosis not present

## 2016-04-12 DIAGNOSIS — Z79899 Other long term (current) drug therapy: Secondary | ICD-10-CM | POA: Diagnosis not present

## 2016-04-12 DIAGNOSIS — R259 Unspecified abnormal involuntary movements: Secondary | ICD-10-CM | POA: Diagnosis not present

## 2016-04-12 LAB — BASIC METABOLIC PANEL
Anion gap: 6 (ref 5–15)
BUN: 22 mg/dL — AB (ref 6–20)
CALCIUM: 9.2 mg/dL (ref 8.9–10.3)
CO2: 25 mmol/L (ref 22–32)
CREATININE: 1.23 mg/dL (ref 0.61–1.24)
Chloride: 109 mmol/L (ref 101–111)
GFR calc non Af Amer: 48 mL/min — ABNORMAL LOW (ref >60)
GFR, EST AFRICAN AMERICAN: 56 mL/min — AB (ref >60)
Glucose, Bld: 95 mg/dL (ref 65–99)
Potassium: 4.2 mmol/L (ref 3.5–5.1)
SODIUM: 140 mmol/L (ref 135–145)

## 2016-04-12 LAB — CBC WITH DIFFERENTIAL/PLATELET
BASOS PCT: 0 %
Basophils Absolute: 0 10*3/uL (ref 0.0–0.1)
EOS ABS: 0.1 10*3/uL (ref 0.0–0.7)
EOS PCT: 1 %
HCT: 41.4 % (ref 39.0–52.0)
Hemoglobin: 13.6 g/dL (ref 13.0–17.0)
Lymphocytes Relative: 18 %
Lymphs Abs: 1.6 10*3/uL (ref 0.7–4.0)
MCH: 31.6 pg (ref 26.0–34.0)
MCHC: 32.9 g/dL (ref 30.0–36.0)
MCV: 96.1 fL (ref 78.0–100.0)
MONO ABS: 0.6 10*3/uL (ref 0.1–1.0)
MONOS PCT: 7 %
Neutro Abs: 6.6 10*3/uL (ref 1.7–7.7)
Neutrophils Relative %: 74 %
Platelets: 171 10*3/uL (ref 150–400)
RBC: 4.31 MIL/uL (ref 4.22–5.81)
RDW: 13.4 % (ref 11.5–15.5)
WBC: 8.9 10*3/uL (ref 4.0–10.5)

## 2016-04-12 NOTE — ED Notes (Signed)
Pt transported to and from radiology on stretcher with tech, tolerated well.  

## 2016-04-12 NOTE — ED Notes (Signed)
Pt presents from [facility], with report of fall.  Pt was ambulating out of bathroom, knees gave out. Pt unsure of how he fell, but complains of R hip.  Pt able to ambulate to ambulance.

## 2016-04-12 NOTE — ED Notes (Signed)
Patient able to ambulate independently  

## 2016-04-12 NOTE — ED Provider Notes (Signed)
CSN: [id number]     Arrival date & time 04/12/16  1213 History   First MD Initiated Contact with Patient 04/12/16 1217     No chief complaint on file.   HPI Comments: 82 year old male presents with a fall earlier today. He lives at an assisted living facility. Past medical history significant for CAD, multiple falls, diabetes, GERD. He states that he was walking back from the bathroom when he lost his balance and fell onto his right side. He states this is a common occurrence for him. He denies LOC. He normally walks with a walker at baseline however he was not using his walker at the time since it was a "short distance". Denies fever, chills, headache, chest pain, shortness of breath, abdominal pain, nausea, vomiting. He is not currently on blood thinners however he does take a baby aspirin daily. He is currently complaining of right hip pain.   Past Medical History  Diagnosis Date  . Edema   . Acid reflux   . Benign prostatic hypertrophy   . Bradycardia   . Embolism - blood clot ~ 2000    "behind left knee"  . History of asbestos exposure     "have some lung disease from years of exposure"  . Angina   . Myocardial infarction ([place]) 11/17/10     NSTEMI  . Arthritis 01-09-12    hands, foot  . Prostate hypertrophy 01-09-12    urinary retention-Foley catheter at present; surgery planned  . Diabetes mellitus    Past Surgical History  Procedure Laterality Date  . Cholecystectomy  1980's  . Cataract extraction, bilateral  ~ 2010  . Blepharoplasty      left eye  . Coronary artery bypass graft  11/26/2011    Procedure:x4- CORONARY ARTERY BYPASS GRAFTING (CABG);  Surgeon: Gaye Pollack, MD;  Location: [place];  Service: Open Heart Surgery;  Laterality: N/A;  . Cardiac catheterization  01-09-12    1'13  . Left heart catheterization with coronary angiogram N/A 11/20/2011    Procedure: LEFT HEART CATHETERIZATION WITH CORONARY ANGIOGRAM;  Surgeon: Jettie Booze, MD;  Location: [facility] CATH LAB;   Service: Cardiovascular;  Laterality: N/A;  possible PCI   Family History  Problem Relation Age of Onset  . Heart attack Father   . Hypertension Neg Hx   . Stroke Neg Hx    Social History  Substance Use Topics  . Smoking status: Former Smoker    Types: Cigars  . Smokeless tobacco: Former Systems developer    Types: Chew     Comment: "hadn't used chew or smoked cigars since 1990's  . Alcohol Use: No    Review of Systems  Constitutional: Negative for fever.  Musculoskeletal: Positive for arthralgias. Negative for joint swelling and neck pain.  Skin: Positive for wound.  Neurological: Negative for headaches.    Allergies  Review of patient's allergies indicates no known allergies.  Home Medications   Prior to Admission medications   Medication Sig Start Date End Date Taking? Authorizing Provider  aspirin 81 MG tablet Take 81 mg by mouth daily.    Historical Provider, MD  atorvastatin (LIPITOR) 20 MG tablet TAKE 1/2 TABLET BY MOUTH ONCE DAILY 06/22/15   Jettie Booze, MD  CALCIUM-VITAMIN D PO Take 600 mg by mouth daily.    Historical Provider, MD  ferrous sulfate 325 (65 FE) MG tablet Take 325 mg by mouth daily with breakfast.     Historical Provider, MD  furosemide (LASIX) 40 MG tablet  Take 40 mg by mouth daily after breakfast. May take extra dose of Lasix up to 2 times weekly for edema.    Historical Provider, MD  methylcellulose (ARTIFICIAL TEARS) 1 % ophthalmic solution Place 2 drops into both eyes 4 (four) times daily.    Historical Provider, MD  Multiple Vitamin (MULITIVITAMIN WITH MINERALS) TABS Take 1 tablet by mouth daily.    Historical Provider, MD  pantoprazole (PROTONIX) 40 MG tablet Take 40 mg by mouth daily.    Historical Provider, MD  potassium chloride SA (K-DUR,KLOR-CON) 20 MEQ tablet Take 2 tablets (40 mEq total) by mouth daily. 12/03/11 03/19/16  Coolidge Breeze, PA-C  tamsulosin (FLOMAX) 0.4 MG CAPS capsule TK 1 C PO BED 03/07/16   Historical Provider, MD   BP 137/77  mmHg  Pulse 71  Temp(Src) 97.6 F (36.4 C) (Oral)  Resp 16  SpO2 97%   Physical Exam  Constitutional: He is oriented to person, place, and time. He appears well-developed and well-nourished. No distress.  HENT:  Head: Normocephalic and atraumatic.  No signs of head trauma  Eyes: Conjunctivae are normal. Pupils are equal, round, and reactive to light. Right eye exhibits discharge. Left eye exhibits discharge. No scleral icterus.  Significant blepharitis of lower eyelids bilaterally with mucoid discharge  Neck: Normal range of motion.  No midline tenderness  Cardiovascular: Normal rate and regular rhythm.  Exam reveals no gallop and no friction rub.   Murmur heard. 3/6 Systolic murmur in the RICS  Pulmonary/Chest: Effort normal. No respiratory distress. He has no wheezes. He has no rales. He exhibits no tenderness.  Abdominal: Soft. Bowel sounds are normal. He exhibits no distension. There is no tenderness.  Musculoskeletal:  Right hip: No obvious swelling or deformity. Tenderness to palpation of lateral hip. FROM. Lower extremity is not rotated or shortened Right knee: No obvious swelling or deformity. Abrasion noted over right anterior knee. Mild tenderness to palpation. FROM.  Left forearm: Moderate swelling over lateral distal forearm with skin tear. Bleeding controlled. No obvious deformity. Moderate tenderness to palpation. N/V intact.    Neurological: He is alert and oriented to person, place, and time.  Skin: Skin is warm and dry.  Psychiatric: He has a normal mood and affect.    ED Course  Procedures (including critical care time) Labs Review Labs Reviewed  BASIC METABOLIC PANEL - Abnormal; Notable for the following:    BUN 22 (*)    GFR calc non Af Amer 48 (*)    GFR calc Af Amer 56 (*)    All other components within normal limits  CBC WITH DIFFERENTIAL/PLATELET    Imaging Review Dg Forearm Left  04/12/2016  CLINICAL DATA:  Fall today, distal forearm laceration  EXAM: LEFT FOREARM - 2 VIEW COMPARISON:  None. FINDINGS: Two views of the left forearm submitted. No acute fracture or subluxation. Mild narrowing of radiocarpal joint space. No radiopaque foreign body. IMPRESSION: No acute fracture or subluxation. Mild narrowing of radiocarpal joint space. Electronically Signed   By: Lahoma Crocker M.D.   On: 04/12/2016 13:51   Ct Head Wo Contrast  04/12/2016  CLINICAL DATA:  82 year old male who fell at home while sitting down. Posterior right side head pain and left side neck pain. Initial encounter. EXAM: CT HEAD WITHOUT CONTRAST CT CERVICAL SPINE WITHOUT CONTRAST TECHNIQUE: Multidetector CT imaging of the head and cervical spine was performed following the standard protocol without intravenous contrast. Multiplanar CT image reconstructions of the cervical spine were also generated. COMPARISON:  Brain MRI 04/07/2012. Cervical spine radiographs 06/17/2008. FINDINGS: CT HEAD FINDINGS No scalp hematoma identified. No acute orbits soft tissue findings. Paranasal sinuses and mastoids are clear. Calvarium intact. Calcified atherosclerosis at the skull base. No midline shift, mass effect, or evidence of intracranial mass lesion. No ventriculomegaly. Chronic Patchy and confluent cerebral white matter changes appear stable to those seen by MRI in 2013. No acute intracranial hemorrhage identified. No cortically based acute infarct identified. No suspicious intracranial vascular hyperdensity. CT CERVICAL SPINE FINDINGS Severe joint space loss at C1-C2 on the left with subsequent ankylosis with extensive sclerosis, subchondral cysts, and mild degenerative appearing fragmentation. C1-C2 alignment otherwise within normal limits. Visualized skull base is intact. No atlanto-occipital dissociation. Superimposed ankylosis also from the C3 to the C5 level, primarily via interbody osteophytes but also the C3-C4 posterior elements are fused. There also appears to be chronic and probably degenerative  fusion at C5-C6. There is disc space loss with severe facet hypertrophy at C2-C3 on the right. There is mild anterolisthesis at C7-T1 with moderate facet hypertrophy greater on the right. Osteopenia. No acute cervical spine fracture identified. Visible upper thoracic levels appear intact. Partially visible median sternotomy. Moderate to severe degenerative changes at the sternoclavicular joints. Negative lung apices. Negative noncontrast paraspinal soft tissues. No prevertebral fluid or soft tissue swelling. IMPRESSION: 1. No acute intracranial abnormality. Noncontrast CT appearance of the brain appears stable to that seen by MRI in 2013. 2. Severe widespread cervical spine degeneration, including multiple levels of ankylosis. No acute fracture identified in the cervical spine. Ligamentous injury is not excluded. Electronically Signed   By: Genevie Ann M.D.   On: 04/12/2016 13:40   Ct Cervical Spine Wo Contrast  04/12/2016  CLINICAL DATA:  82 year old male who fell at home while sitting down. Posterior right side head pain and left side neck pain. Initial encounter. EXAM: CT HEAD WITHOUT CONTRAST CT CERVICAL SPINE WITHOUT CONTRAST TECHNIQUE: Multidetector CT imaging of the head and cervical spine was performed following the standard protocol without intravenous contrast. Multiplanar CT image reconstructions of the cervical spine were also generated. COMPARISON:  Brain MRI 04/07/2012. Cervical spine radiographs 06/17/2008. FINDINGS: CT HEAD FINDINGS No scalp hematoma identified. No acute orbits soft tissue findings. Paranasal sinuses and mastoids are clear. Calvarium intact. Calcified atherosclerosis at the skull base. No midline shift, mass effect, or evidence of intracranial mass lesion. No ventriculomegaly. Chronic Patchy and confluent cerebral white matter changes appear stable to those seen by MRI in 2013. No acute intracranial hemorrhage identified. No cortically based acute infarct identified. No suspicious  intracranial vascular hyperdensity. CT CERVICAL SPINE FINDINGS Severe joint space loss at C1-C2 on the left with subsequent ankylosis with extensive sclerosis, subchondral cysts, and mild degenerative appearing fragmentation. C1-C2 alignment otherwise within normal limits. Visualized skull base is intact. No atlanto-occipital dissociation. Superimposed ankylosis also from the C3 to the C5 level, primarily via interbody osteophytes but also the C3-C4 posterior elements are fused. There also appears to be chronic and probably degenerative fusion at C5-C6. There is disc space loss with severe facet hypertrophy at C2-C3 on the right. There is mild anterolisthesis at C7-T1 with moderate facet hypertrophy greater on the right. Osteopenia. No acute cervical spine fracture identified. Visible upper thoracic levels appear intact. Partially visible median sternotomy. Moderate to severe degenerative changes at the sternoclavicular joints. Negative lung apices. Negative noncontrast paraspinal soft tissues. No prevertebral fluid or soft tissue swelling. IMPRESSION: 1. No acute intracranial abnormality. Noncontrast CT appearance of the brain appears stable to  that seen by MRI in 2013. 2. Severe widespread cervical spine degeneration, including multiple levels of ankylosis. No acute fracture identified in the cervical spine. Ligamentous injury is not excluded. Electronically Signed   By: Genevie Ann M.D.   On: 04/12/2016 13:40   Dg Knee Complete 4 Views Right  04/12/2016  CLINICAL DATA:  Fall today, right knee pain EXAM: RIGHT KNEE - COMPLETE 4+ VIEW COMPARISON:  None. FINDINGS: Four views of the right knee submitted. No acute fracture or subluxation. Mild degenerative changes. Narrowing of medial joint compartment. Mild spurring of medial femoral condyle and medial tibial plateau. Narrowing of patellofemoral joint space. Prepatellar soft tissue swelling. IMPRESSION: No acute fracture or subluxation. Degenerative changes as  described above. Mild prepatellar soft tissue swelling. Electronically Signed   By: Lahoma Crocker M.D.   On: 04/12/2016 13:50   Dg Hip Unilat With Pelvis 2-3 Views Right  04/12/2016  CLINICAL DATA:  Fall today, right hip pain EXAM: DG HIP (WITH OR WITHOUT PELVIS) 2-3V RIGHT COMPARISON:  None. FINDINGS: Three views of the right hip submitted. No acute fracture or subluxation. Mild degenerative changes bilateral hip joints with superior acetabular spurring. Mild degenerative changes pubic symphysis. There is diffuse osteopenia. IMPRESSION: No acute fracture or subluxation. Diffuse osteopenia. Degenerative changes as described above. Electronically Signed   By: Lahoma Crocker M.D.   On: 04/12/2016 13:48   I have personally reviewed and evaluated these images and lab results as part of my medical decision-making.   EKG Interpretation None      MDM   Final diagnoses:  Fall, initial encounter   82 year old male presents with a fall. CT head and neck, xray of hip, knee, left forearm are negative for acute findings. CBC is unremarkable. BMP is unremarkable. Patient is complaining of only mild pain at this time. Shared visit with Dr. Audie Pinto. Patient is safe to return to ALF. Daughter would like to transport patient. Patient is NAD, non-toxic, with stable VS. Patient is informed of clinical course, understands medical decision making process, and agrees with plan. Opportunity for questions provided and all questions answered. Return precautions given.      Recardo Evangelist, PA-C 04/13/16 1010  Leonard Schwartz, MD 04/19/16 [phone]

## 2016-04-12 NOTE — ED Notes (Signed)
PA at bedside updating pt 

## 2016-04-17 DIAGNOSIS — M19071 Primary osteoarthritis, right ankle and foot: Secondary | ICD-10-CM | POA: Diagnosis not present

## 2016-04-17 DIAGNOSIS — N401 Enlarged prostate with lower urinary tract symptoms: Secondary | ICD-10-CM | POA: Diagnosis not present

## 2016-04-17 DIAGNOSIS — M19072 Primary osteoarthritis, left ankle and foot: Secondary | ICD-10-CM | POA: Diagnosis not present

## 2016-04-17 DIAGNOSIS — E119 Type 2 diabetes mellitus without complications: Secondary | ICD-10-CM | POA: Diagnosis not present

## 2016-04-17 DIAGNOSIS — L89312 Pressure ulcer of right buttock, stage 2: Secondary | ICD-10-CM | POA: Diagnosis not present

## 2016-04-17 DIAGNOSIS — L89311 Pressure ulcer of right buttock, stage 1: Secondary | ICD-10-CM | POA: Diagnosis not present

## 2016-04-19 DIAGNOSIS — M19071 Primary osteoarthritis, right ankle and foot: Secondary | ICD-10-CM | POA: Diagnosis not present

## 2016-04-19 DIAGNOSIS — L89312 Pressure ulcer of right buttock, stage 2: Secondary | ICD-10-CM | POA: Diagnosis not present

## 2016-04-19 DIAGNOSIS — L89311 Pressure ulcer of right buttock, stage 1: Secondary | ICD-10-CM | POA: Diagnosis not present

## 2016-04-19 DIAGNOSIS — M19072 Primary osteoarthritis, left ankle and foot: Secondary | ICD-10-CM | POA: Diagnosis not present

## 2016-04-19 DIAGNOSIS — E119 Type 2 diabetes mellitus without complications: Secondary | ICD-10-CM | POA: Diagnosis not present

## 2016-04-19 DIAGNOSIS — N401 Enlarged prostate with lower urinary tract symptoms: Secondary | ICD-10-CM | POA: Diagnosis not present

## 2016-04-23 DIAGNOSIS — E119 Type 2 diabetes mellitus without complications: Secondary | ICD-10-CM | POA: Diagnosis not present

## 2016-04-23 DIAGNOSIS — L89312 Pressure ulcer of right buttock, stage 2: Secondary | ICD-10-CM | POA: Diagnosis not present

## 2016-04-23 DIAGNOSIS — L89311 Pressure ulcer of right buttock, stage 1: Secondary | ICD-10-CM | POA: Diagnosis not present

## 2016-04-23 DIAGNOSIS — N401 Enlarged prostate with lower urinary tract symptoms: Secondary | ICD-10-CM | POA: Diagnosis not present

## 2016-04-23 DIAGNOSIS — M19072 Primary osteoarthritis, left ankle and foot: Secondary | ICD-10-CM | POA: Diagnosis not present

## 2016-04-23 DIAGNOSIS — M19071 Primary osteoarthritis, right ankle and foot: Secondary | ICD-10-CM | POA: Diagnosis not present

## 2016-04-24 DIAGNOSIS — N401 Enlarged prostate with lower urinary tract symptoms: Secondary | ICD-10-CM | POA: Diagnosis not present

## 2016-04-24 DIAGNOSIS — M19071 Primary osteoarthritis, right ankle and foot: Secondary | ICD-10-CM | POA: Diagnosis not present

## 2016-04-24 DIAGNOSIS — M19072 Primary osteoarthritis, left ankle and foot: Secondary | ICD-10-CM | POA: Diagnosis not present

## 2016-04-24 DIAGNOSIS — L89311 Pressure ulcer of right buttock, stage 1: Secondary | ICD-10-CM | POA: Diagnosis not present

## 2016-04-24 DIAGNOSIS — L89312 Pressure ulcer of right buttock, stage 2: Secondary | ICD-10-CM | POA: Diagnosis not present

## 2016-04-24 DIAGNOSIS — E119 Type 2 diabetes mellitus without complications: Secondary | ICD-10-CM | POA: Diagnosis not present

## 2016-04-25 DIAGNOSIS — E119 Type 2 diabetes mellitus without complications: Secondary | ICD-10-CM | POA: Diagnosis not present

## 2016-04-25 DIAGNOSIS — L89311 Pressure ulcer of right buttock, stage 1: Secondary | ICD-10-CM | POA: Diagnosis not present

## 2016-04-25 DIAGNOSIS — N401 Enlarged prostate with lower urinary tract symptoms: Secondary | ICD-10-CM | POA: Diagnosis not present

## 2016-04-25 DIAGNOSIS — M19072 Primary osteoarthritis, left ankle and foot: Secondary | ICD-10-CM | POA: Diagnosis not present

## 2016-04-25 DIAGNOSIS — L89312 Pressure ulcer of right buttock, stage 2: Secondary | ICD-10-CM | POA: Diagnosis not present

## 2016-04-25 DIAGNOSIS — M19071 Primary osteoarthritis, right ankle and foot: Secondary | ICD-10-CM | POA: Diagnosis not present

## 2016-04-26 DIAGNOSIS — M19072 Primary osteoarthritis, left ankle and foot: Secondary | ICD-10-CM | POA: Diagnosis not present

## 2016-04-26 DIAGNOSIS — N401 Enlarged prostate with lower urinary tract symptoms: Secondary | ICD-10-CM | POA: Diagnosis not present

## 2016-04-26 DIAGNOSIS — L89311 Pressure ulcer of right buttock, stage 1: Secondary | ICD-10-CM | POA: Diagnosis not present

## 2016-04-26 DIAGNOSIS — L89312 Pressure ulcer of right buttock, stage 2: Secondary | ICD-10-CM | POA: Diagnosis not present

## 2016-04-26 DIAGNOSIS — E119 Type 2 diabetes mellitus without complications: Secondary | ICD-10-CM | POA: Diagnosis not present

## 2016-04-26 DIAGNOSIS — M19071 Primary osteoarthritis, right ankle and foot: Secondary | ICD-10-CM | POA: Diagnosis not present

## 2016-05-01 DIAGNOSIS — M19072 Primary osteoarthritis, left ankle and foot: Secondary | ICD-10-CM | POA: Diagnosis not present

## 2016-05-01 DIAGNOSIS — E119 Type 2 diabetes mellitus without complications: Secondary | ICD-10-CM | POA: Diagnosis not present

## 2016-05-01 DIAGNOSIS — N401 Enlarged prostate with lower urinary tract symptoms: Secondary | ICD-10-CM | POA: Diagnosis not present

## 2016-05-01 DIAGNOSIS — L89311 Pressure ulcer of right buttock, stage 1: Secondary | ICD-10-CM | POA: Diagnosis not present

## 2016-05-01 DIAGNOSIS — L89312 Pressure ulcer of right buttock, stage 2: Secondary | ICD-10-CM | POA: Diagnosis not present

## 2016-05-01 DIAGNOSIS — M19071 Primary osteoarthritis, right ankle and foot: Secondary | ICD-10-CM | POA: Diagnosis not present

## 2016-05-03 DIAGNOSIS — M19071 Primary osteoarthritis, right ankle and foot: Secondary | ICD-10-CM | POA: Diagnosis not present

## 2016-05-03 DIAGNOSIS — L89311 Pressure ulcer of right buttock, stage 1: Secondary | ICD-10-CM | POA: Diagnosis not present

## 2016-05-03 DIAGNOSIS — L89312 Pressure ulcer of right buttock, stage 2: Secondary | ICD-10-CM | POA: Diagnosis not present

## 2016-05-03 DIAGNOSIS — M19072 Primary osteoarthritis, left ankle and foot: Secondary | ICD-10-CM | POA: Diagnosis not present

## 2016-05-03 DIAGNOSIS — E119 Type 2 diabetes mellitus without complications: Secondary | ICD-10-CM | POA: Diagnosis not present

## 2016-05-03 DIAGNOSIS — N401 Enlarged prostate with lower urinary tract symptoms: Secondary | ICD-10-CM | POA: Diagnosis not present

## 2016-05-06 DIAGNOSIS — N401 Enlarged prostate with lower urinary tract symptoms: Secondary | ICD-10-CM | POA: Diagnosis not present

## 2016-05-06 DIAGNOSIS — E119 Type 2 diabetes mellitus without complications: Secondary | ICD-10-CM | POA: Diagnosis not present

## 2016-05-06 DIAGNOSIS — M19072 Primary osteoarthritis, left ankle and foot: Secondary | ICD-10-CM | POA: Diagnosis not present

## 2016-05-06 DIAGNOSIS — M19071 Primary osteoarthritis, right ankle and foot: Secondary | ICD-10-CM | POA: Diagnosis not present

## 2016-05-06 DIAGNOSIS — L89311 Pressure ulcer of right buttock, stage 1: Secondary | ICD-10-CM | POA: Diagnosis not present

## 2016-05-06 DIAGNOSIS — L89312 Pressure ulcer of right buttock, stage 2: Secondary | ICD-10-CM | POA: Diagnosis not present

## 2016-05-07 DIAGNOSIS — K219 Gastro-esophageal reflux disease without esophagitis: Secondary | ICD-10-CM | POA: Diagnosis not present

## 2016-05-07 DIAGNOSIS — R609 Edema, unspecified: Secondary | ICD-10-CM | POA: Diagnosis not present

## 2016-05-07 DIAGNOSIS — R296 Repeated falls: Secondary | ICD-10-CM | POA: Diagnosis not present

## 2016-05-07 DIAGNOSIS — E7800 Pure hypercholesterolemia, unspecified: Secondary | ICD-10-CM | POA: Diagnosis not present

## 2016-05-07 DIAGNOSIS — R740 Nonspecific elevation of levels of transaminase and lactic acid dehydrogenase [LDH]: Secondary | ICD-10-CM | POA: Diagnosis not present

## 2016-05-08 DIAGNOSIS — M19071 Primary osteoarthritis, right ankle and foot: Secondary | ICD-10-CM | POA: Diagnosis not present

## 2016-05-08 DIAGNOSIS — L89312 Pressure ulcer of right buttock, stage 2: Secondary | ICD-10-CM | POA: Diagnosis not present

## 2016-05-08 DIAGNOSIS — L89311 Pressure ulcer of right buttock, stage 1: Secondary | ICD-10-CM | POA: Diagnosis not present

## 2016-05-08 DIAGNOSIS — M19072 Primary osteoarthritis, left ankle and foot: Secondary | ICD-10-CM | POA: Diagnosis not present

## 2016-05-08 DIAGNOSIS — E119 Type 2 diabetes mellitus without complications: Secondary | ICD-10-CM | POA: Diagnosis not present

## 2016-05-08 DIAGNOSIS — N401 Enlarged prostate with lower urinary tract symptoms: Secondary | ICD-10-CM | POA: Diagnosis not present

## 2016-05-09 DIAGNOSIS — L89312 Pressure ulcer of right buttock, stage 2: Secondary | ICD-10-CM | POA: Diagnosis not present

## 2016-05-09 DIAGNOSIS — E119 Type 2 diabetes mellitus without complications: Secondary | ICD-10-CM | POA: Diagnosis not present

## 2016-05-09 DIAGNOSIS — M19071 Primary osteoarthritis, right ankle and foot: Secondary | ICD-10-CM | POA: Diagnosis not present

## 2016-05-09 DIAGNOSIS — M19072 Primary osteoarthritis, left ankle and foot: Secondary | ICD-10-CM | POA: Diagnosis not present

## 2016-05-09 DIAGNOSIS — L89311 Pressure ulcer of right buttock, stage 1: Secondary | ICD-10-CM | POA: Diagnosis not present

## 2016-05-09 DIAGNOSIS — N401 Enlarged prostate with lower urinary tract symptoms: Secondary | ICD-10-CM | POA: Diagnosis not present

## 2016-05-13 DIAGNOSIS — L89311 Pressure ulcer of right buttock, stage 1: Secondary | ICD-10-CM | POA: Diagnosis not present

## 2016-05-13 DIAGNOSIS — N401 Enlarged prostate with lower urinary tract symptoms: Secondary | ICD-10-CM | POA: Diagnosis not present

## 2016-05-13 DIAGNOSIS — M19072 Primary osteoarthritis, left ankle and foot: Secondary | ICD-10-CM | POA: Diagnosis not present

## 2016-05-13 DIAGNOSIS — E119 Type 2 diabetes mellitus without complications: Secondary | ICD-10-CM | POA: Diagnosis not present

## 2016-05-13 DIAGNOSIS — M19071 Primary osteoarthritis, right ankle and foot: Secondary | ICD-10-CM | POA: Diagnosis not present

## 2016-05-13 DIAGNOSIS — L89312 Pressure ulcer of right buttock, stage 2: Secondary | ICD-10-CM | POA: Diagnosis not present

## 2016-05-16 DIAGNOSIS — M19071 Primary osteoarthritis, right ankle and foot: Secondary | ICD-10-CM | POA: Diagnosis not present

## 2016-05-16 DIAGNOSIS — N401 Enlarged prostate with lower urinary tract symptoms: Secondary | ICD-10-CM | POA: Diagnosis not present

## 2016-05-16 DIAGNOSIS — L89312 Pressure ulcer of right buttock, stage 2: Secondary | ICD-10-CM | POA: Diagnosis not present

## 2016-05-16 DIAGNOSIS — L89311 Pressure ulcer of right buttock, stage 1: Secondary | ICD-10-CM | POA: Diagnosis not present

## 2016-05-16 DIAGNOSIS — E119 Type 2 diabetes mellitus without complications: Secondary | ICD-10-CM | POA: Diagnosis not present

## 2016-05-16 DIAGNOSIS — M19072 Primary osteoarthritis, left ankle and foot: Secondary | ICD-10-CM | POA: Diagnosis not present

## 2016-05-19 ENCOUNTER — Encounter (HOSPITAL_COMMUNITY): Payer: Self-pay

## 2016-05-19 ENCOUNTER — Emergency Department (HOSPITAL_COMMUNITY): Payer: Medicare Other

## 2016-05-19 ENCOUNTER — Inpatient Hospital Stay (HOSPITAL_COMMUNITY)
Admission: EM | Admit: 2016-05-19 | Discharge: 2016-05-22 | DRG: 683 | Disposition: A | Discharge status: Skilled Nursing Facility | Payer: Medicare Other | Attending: Internal Medicine | Admitting: Internal Medicine

## 2016-05-19 DIAGNOSIS — N401 Enlarged prostate with lower urinary tract symptoms: Secondary | ICD-10-CM | POA: Diagnosis present

## 2016-05-19 DIAGNOSIS — I252 Old myocardial infarction: Secondary | ICD-10-CM

## 2016-05-19 DIAGNOSIS — E860 Dehydration: Secondary | ICD-10-CM | POA: Diagnosis present

## 2016-05-19 DIAGNOSIS — N179 Acute kidney failure, unspecified: Principal | ICD-10-CM | POA: Diagnosis present

## 2016-05-19 DIAGNOSIS — M1990 Unspecified osteoarthritis, unspecified site: Secondary | ICD-10-CM | POA: Diagnosis present

## 2016-05-19 DIAGNOSIS — R338 Other retention of urine: Secondary | ICD-10-CM | POA: Diagnosis not present

## 2016-05-19 DIAGNOSIS — R296 Repeated falls: Secondary | ICD-10-CM | POA: Diagnosis not present

## 2016-05-19 DIAGNOSIS — E875 Hyperkalemia: Secondary | ICD-10-CM | POA: Diagnosis present

## 2016-05-19 DIAGNOSIS — N138 Other obstructive and reflux uropathy: Secondary | ICD-10-CM | POA: Diagnosis present

## 2016-05-19 DIAGNOSIS — Z951 Presence of aortocoronary bypass graft: Secondary | ICD-10-CM | POA: Diagnosis not present

## 2016-05-19 DIAGNOSIS — E119 Type 2 diabetes mellitus without complications: Secondary | ICD-10-CM | POA: Diagnosis present

## 2016-05-19 DIAGNOSIS — F0390 Unspecified dementia without behavioral disturbance: Secondary | ICD-10-CM | POA: Diagnosis present

## 2016-05-19 DIAGNOSIS — N320 Bladder-neck obstruction: Secondary | ICD-10-CM | POA: Diagnosis present

## 2016-05-19 DIAGNOSIS — N178 Other acute kidney failure: Secondary | ICD-10-CM | POA: Diagnosis not present

## 2016-05-19 DIAGNOSIS — N170 Acute kidney failure with tubular necrosis: Secondary | ICD-10-CM | POA: Diagnosis not present

## 2016-05-19 DIAGNOSIS — I672 Cerebral atherosclerosis: Secondary | ICD-10-CM | POA: Diagnosis not present

## 2016-05-19 DIAGNOSIS — Z66 Do not resuscitate: Secondary | ICD-10-CM | POA: Diagnosis present

## 2016-05-19 DIAGNOSIS — M47892 Other spondylosis, cervical region: Secondary | ICD-10-CM | POA: Diagnosis not present

## 2016-05-19 DIAGNOSIS — M542 Cervicalgia: Secondary | ICD-10-CM | POA: Diagnosis not present

## 2016-05-19 DIAGNOSIS — J90 Pleural effusion, not elsewhere classified: Secondary | ICD-10-CM | POA: Diagnosis not present

## 2016-05-19 DIAGNOSIS — K219 Gastro-esophageal reflux disease without esophagitis: Secondary | ICD-10-CM | POA: Diagnosis present

## 2016-05-19 DIAGNOSIS — Z9049 Acquired absence of other specified parts of digestive tract: Secondary | ICD-10-CM

## 2016-05-19 DIAGNOSIS — N189 Chronic kidney disease, unspecified: Secondary | ICD-10-CM | POA: Diagnosis not present

## 2016-05-19 DIAGNOSIS — Z8249 Family history of ischemic heart disease and other diseases of the circulatory system: Secondary | ICD-10-CM

## 2016-05-19 DIAGNOSIS — T148 Other injury of unspecified body region: Secondary | ICD-10-CM | POA: Diagnosis not present

## 2016-05-19 DIAGNOSIS — N139 Obstructive and reflux uropathy, unspecified: Secondary | ICD-10-CM

## 2016-05-19 DIAGNOSIS — E785 Hyperlipidemia, unspecified: Secondary | ICD-10-CM | POA: Diagnosis present

## 2016-05-19 DIAGNOSIS — R531 Weakness: Secondary | ICD-10-CM | POA: Diagnosis not present

## 2016-05-19 DIAGNOSIS — M6281 Muscle weakness (generalized): Secondary | ICD-10-CM | POA: Diagnosis not present

## 2016-05-19 DIAGNOSIS — Z5189 Encounter for other specified aftercare: Secondary | ICD-10-CM | POA: Diagnosis not present

## 2016-05-19 DIAGNOSIS — I6523 Occlusion and stenosis of bilateral carotid arteries: Secondary | ICD-10-CM | POA: Diagnosis not present

## 2016-05-19 DIAGNOSIS — W19XXXA Unspecified fall, initial encounter: Secondary | ICD-10-CM

## 2016-05-19 DIAGNOSIS — Z9181 History of falling: Secondary | ICD-10-CM | POA: Diagnosis not present

## 2016-05-19 LAB — CBC WITH DIFFERENTIAL/PLATELET
Basophils Absolute: 0 10*3/uL (ref 0.0–0.1)
Basophils Relative: 0 %
EOS ABS: 0 10*3/uL (ref 0.0–0.7)
EOS PCT: 0 %
HEMATOCRIT: 38.2 % — AB (ref 39.0–52.0)
HEMOGLOBIN: 13.1 g/dL (ref 13.0–17.0)
LYMPHS ABS: 0.6 10*3/uL — AB (ref 0.7–4.0)
Lymphocytes Relative: 4 %
MCH: 32.4 pg (ref 26.0–34.0)
MCHC: 34.3 g/dL (ref 30.0–36.0)
MCV: 94.6 fL (ref 78.0–100.0)
MONO ABS: 1.8 10*3/uL — AB (ref 0.1–1.0)
Monocytes Relative: 12 %
Neutro Abs: 13.5 10*3/uL — ABNORMAL HIGH (ref 1.7–7.7)
Neutrophils Relative %: 84 %
Platelets: 151 10*3/uL (ref 150–400)
RBC: 4.04 MIL/uL — AB (ref 4.22–5.81)
RDW: 13.6 % (ref 11.5–15.5)
WBC: 15.9 10*3/uL — AB (ref 4.0–10.5)

## 2016-05-19 LAB — MRSA PCR SCREENING: MRSA by PCR: POSITIVE — AB

## 2016-05-19 LAB — CBC
HCT: 33.5 % — ABNORMAL LOW (ref 39.0–52.0)
Hemoglobin: 11.7 g/dL — ABNORMAL LOW (ref 13.0–17.0)
MCH: 32.6 pg (ref 26.0–34.0)
MCHC: 34.9 g/dL (ref 30.0–36.0)
MCV: 93.3 fL (ref 78.0–100.0)
PLATELETS: 148 10*3/uL — AB (ref 150–400)
RBC: 3.59 MIL/uL — AB (ref 4.22–5.81)
RDW: 13.9 % (ref 11.5–15.5)
WBC: 12.9 10*3/uL — ABNORMAL HIGH (ref 4.0–10.5)

## 2016-05-19 LAB — COMPREHENSIVE METABOLIC PANEL
ALBUMIN: 4.1 g/dL (ref 3.5–5.0)
ALT: 17 U/L (ref 17–63)
AST: 19 U/L (ref 15–41)
Alkaline Phosphatase: 60 U/L (ref 38–126)
Anion gap: 15 (ref 5–15)
BILIRUBIN TOTAL: 0.8 mg/dL (ref 0.3–1.2)
BUN: 82 mg/dL — AB (ref 6–20)
CHLORIDE: 95 mmol/L — AB (ref 101–111)
CO2: 17 mmol/L — ABNORMAL LOW (ref 22–32)
CREATININE: 8.01 mg/dL — AB (ref 0.61–1.24)
Calcium: 8.8 mg/dL — ABNORMAL LOW (ref 8.9–10.3)
GFR calc Af Amer: 6 mL/min — ABNORMAL LOW (ref >60)
GFR, EST NON AFRICAN AMERICAN: 5 mL/min — AB (ref >60)
GLUCOSE: 105 mg/dL — AB (ref 65–99)
Potassium: 6.6 mmol/L (ref 3.5–5.1)
Sodium: 127 mmol/L — ABNORMAL LOW (ref 135–145)
Total Protein: 7.2 g/dL (ref 6.5–8.1)

## 2016-05-19 LAB — URINALYSIS, ROUTINE W REFLEX MICROSCOPIC
BILIRUBIN URINE: NEGATIVE
Glucose, UA: 100 mg/dL — AB
Ketones, ur: NEGATIVE mg/dL
NITRITE: NEGATIVE
PROTEIN: NEGATIVE mg/dL
SPECIFIC GRAVITY, URINE: 1.009 (ref 1.005–1.030)
pH: 5.5 (ref 5.0–8.0)

## 2016-05-19 LAB — CREATININE, SERUM
Creatinine, Ser: 6.23 mg/dL — ABNORMAL HIGH (ref 0.61–1.24)
GFR calc non Af Amer: 7 mL/min — ABNORMAL LOW (ref >60)
GFR, EST AFRICAN AMERICAN: 8 mL/min — AB (ref >60)

## 2016-05-19 LAB — PROTIME-INR
INR: 1.25 (ref 0.00–1.49)
PROTHROMBIN TIME: 15.9 s — AB (ref 11.6–15.2)

## 2016-05-19 LAB — URINE MICROSCOPIC-ADD ON
BACTERIA UA: NONE SEEN
SQUAMOUS EPITHELIAL / LPF: NONE SEEN

## 2016-05-19 LAB — TSH: TSH: 2.531 u[IU]/mL (ref 0.350–4.500)

## 2016-05-19 LAB — I-STAT TROPONIN, ED: Troponin i, poc: 0.02 ng/mL (ref 0.00–0.08)

## 2016-05-19 LAB — BRAIN NATRIURETIC PEPTIDE: B NATRIURETIC PEPTIDE 5: 363.2 pg/mL — AB (ref 0.0–100.0)

## 2016-05-19 MED ORDER — FERROUS SULFATE 325 (65 FE) MG PO TABS
325.0000 mg | ORAL_TABLET | Freq: Two times a day (BID) | ORAL | Status: DC
  Administered 2016-05-19 – 2016-05-22 (×6): 325 mg via ORAL
  Filled 2016-05-19 (×6): qty 1

## 2016-05-19 MED ORDER — ASPIRIN 81 MG PO CHEW
81.0000 mg | CHEWABLE_TABLET | Freq: Every day | ORAL | Status: DC
  Administered 2016-05-19 – 2016-05-22 (×4): 81 mg via ORAL
  Filled 2016-05-19 (×4): qty 1

## 2016-05-19 MED ORDER — MUPIROCIN 2 % EX OINT
1.0000 "application " | TOPICAL_OINTMENT | Freq: Two times a day (BID) | CUTANEOUS | Status: DC
  Administered 2016-05-19 – 2016-05-22 (×6): 1 via NASAL
  Filled 2016-05-19 (×2): qty 22

## 2016-05-19 MED ORDER — INSULIN ASPART 100 UNIT/ML IV SOLN
10.0000 [IU] | Freq: Once | INTRAVENOUS | Status: AC
  Administered 2016-05-19: 10 [IU] via INTRAVENOUS
  Filled 2016-05-19: qty 0.1

## 2016-05-19 MED ORDER — HEPARIN SODIUM (PORCINE) 5000 UNIT/ML IJ SOLN
5000.0000 [IU] | Freq: Three times a day (TID) | INTRAMUSCULAR | Status: DC
  Administered 2016-05-19 – 2016-05-22 (×8): 5000 [IU] via SUBCUTANEOUS
  Filled 2016-05-19 (×8): qty 1

## 2016-05-19 MED ORDER — ALBUTEROL SULFATE (2.5 MG/3ML) 0.083% IN NEBU
10.0000 mg | INHALATION_SOLUTION | Freq: Once | RESPIRATORY_TRACT | Status: DC
  Filled 2016-05-19: qty 12

## 2016-05-19 MED ORDER — CHLORHEXIDINE GLUCONATE CLOTH 2 % EX PADS
6.0000 | MEDICATED_PAD | Freq: Every day | CUTANEOUS | Status: DC
  Administered 2016-05-20 – 2016-05-22 (×3): 6 via TOPICAL

## 2016-05-19 MED ORDER — ACETAMINOPHEN 325 MG PO TABS: 650.0000 mg | ORAL_TABLET | Freq: Four times a day (QID) | ORAL | Status: DC | PRN

## 2016-05-19 MED ORDER — SODIUM CHLORIDE 0.9 % IV BOLUS (SEPSIS)
500.0000 mL | Freq: Once | INTRAVENOUS | Status: AC
  Administered 2016-05-19: 500 mL via INTRAVENOUS

## 2016-05-19 MED ORDER — ATORVASTATIN CALCIUM 10 MG PO TABS
5.0000 mg | ORAL_TABLET | Freq: Every evening | ORAL | Status: DC
  Administered 2016-05-19 – 2016-05-21 (×3): 5 mg via ORAL
  Filled 2016-05-19 (×3): qty 1

## 2016-05-19 MED ORDER — POLYVINYL ALCOHOL 1.4 % OP SOLN
2.0000 [drp] | Freq: Four times a day (QID) | OPHTHALMIC | Status: DC
  Administered 2016-05-19 – 2016-05-22 (×10): 2 [drp] via OPHTHALMIC
  Filled 2016-05-19: qty 15

## 2016-05-19 MED ORDER — HYDROCODONE-ACETAMINOPHEN 5-325 MG PO TABS: 1.0000 | ORAL_TABLET | ORAL | Status: DC | PRN

## 2016-05-19 MED ORDER — SODIUM BICARBONATE 8.4 % IV SOLN
50.0000 meq | Freq: Once | INTRAVENOUS | Status: AC
  Administered 2016-05-19: 50 meq via INTRAVENOUS
  Filled 2016-05-19: qty 50

## 2016-05-19 MED ORDER — ONDANSETRON HCL 4 MG PO TABS: 4.0000 mg | ORAL_TABLET | Freq: Four times a day (QID) | ORAL | Status: DC | PRN

## 2016-05-19 MED ORDER — ACETAMINOPHEN 650 MG RE SUPP: 650.0000 mg | Freq: Four times a day (QID) | RECTAL | Status: DC | PRN

## 2016-05-19 MED ORDER — MORPHINE SULFATE (PF) 2 MG/ML IV SOLN: 1.0000 mg | INTRAVENOUS | Status: DC | PRN

## 2016-05-19 MED ORDER — PANTOPRAZOLE SODIUM 40 MG PO TBEC
40.0000 mg | DELAYED_RELEASE_TABLET | Freq: Every day | ORAL | Status: DC
  Administered 2016-05-19 – 2016-05-22 (×4): 40 mg via ORAL
  Filled 2016-05-19 (×4): qty 1

## 2016-05-19 MED ORDER — ENSURE ENLIVE PO LIQD
237.0000 mL | Freq: Two times a day (BID) | ORAL | Status: DC
  Administered 2016-05-20 – 2016-05-21 (×3): 237 mL via ORAL

## 2016-05-19 MED ORDER — ONDANSETRON HCL 4 MG/2ML IJ SOLN: 4.0000 mg | Freq: Four times a day (QID) | INTRAMUSCULAR | Status: DC | PRN

## 2016-05-19 MED ORDER — ALBUTEROL (5 MG/ML) CONTINUOUS INHALATION SOLN
INHALATION_SOLUTION | RESPIRATORY_TRACT | Status: AC
  Administered 2016-05-19: 12:00:00
  Filled 2016-05-19: qty 20

## 2016-05-19 MED ORDER — SODIUM CHLORIDE 0.9 % IV SOLN
INTRAVENOUS | Status: DC
  Administered 2016-05-19: 17:00:00 via INTRAVENOUS

## 2016-05-19 MED ORDER — FINASTERIDE 5 MG PO TABS
5.0000 mg | ORAL_TABLET | Freq: Every day | ORAL | Status: DC
  Administered 2016-05-20 – 2016-05-22 (×3): 5 mg via ORAL
  Filled 2016-05-19 (×4): qty 1

## 2016-05-19 MED ORDER — SODIUM CHLORIDE 0.9% FLUSH
3.0000 mL | Freq: Two times a day (BID) | INTRAVENOUS | Status: DC
  Administered 2016-05-20 – 2016-05-21 (×2): 3 mL via INTRAVENOUS

## 2016-05-19 MED ORDER — METHYLCELLULOSE 1 % OP SOLN: 2.0000 [drp] | Freq: Four times a day (QID) | OPHTHALMIC | Status: DC

## 2016-05-19 MED ORDER — ALBUTEROL SULFATE (2.5 MG/3ML) 0.083% IN NEBU
INHALATION_SOLUTION | RESPIRATORY_TRACT | Status: AC
  Filled 2016-05-19: qty 9

## 2016-05-19 MED ORDER — TAMSULOSIN HCL 0.4 MG PO CAPS
0.4000 mg | ORAL_CAPSULE | Freq: Every day | ORAL | Status: DC
  Administered 2016-05-19: 0.4 mg via ORAL
  Filled 2016-05-19: qty 1

## 2016-05-19 MED ORDER — DEXTROSE 50 % IV SOLN
1.0000 | Freq: Once | INTRAVENOUS | Status: AC
  Administered 2016-05-19: 50 mL via INTRAVENOUS
  Filled 2016-05-19: qty 50

## 2016-05-19 NOTE — ED Provider Notes (Signed)
[facility] DEPT Provider Note   CSN: [id number] Arrival date & time: 05/19/16  [id number]  First Provider Contact:  First MD Initiated Contact with Patient 05/19/16 0945        History   Chief Complaint Chief Complaint  Patient presents with  . Fall    HPI CATHERINE SAMA is a 82 y.o. male.  Level V Caveat: Dementia   JAQUIS DUPREE is a 82 y.o. Male who presents to the emergency department after an unwitnessed fall while at his nursing home today. Patient stays at [facility]. He was found by nursing staff on the floor. He was mentating at his baseline. Patient complains of some head pain, and left abdominal pain. Patient's daughter at bedside reports he seems to been having some more swelling over the past couple days. She believes he's had some increased shortness of breath. Patient denies other complaints.   The history is provided by the patient, a relative and the EMS personnel. No language interpreter was used.  Fall  Pertinent negatives include no fever.    Past Medical History:  Diagnosis Date  . Acid reflux   . Angina   . Arthritis 01-09-12   hands, foot  . Benign prostatic hypertrophy   . Bradycardia   . Diabetes mellitus   . Edema   . Embolism - blood clot ~ 2000   "behind left knee"  . History of asbestos exposure    "have some lung disease from years of exposure"  . Myocardial infarction ([place]) 11/17/10    NSTEMI  . Prostate hypertrophy 01-09-12   urinary retention-Foley catheter at present; surgery planned    Patient Active Problem List   Diagnosis Date Noted  . Acute renal failure (ARF) ([place]) 05/19/2016  . Mixed hyperlipidemia 11/10/2013  . Old myocardial infarction 11/10/2013  . Edema 11/10/2013  . Postsurgical aortocoronary bypass status 02/06/2012  . Coronary atherosclerosis 02/06/2012    Past Surgical History:  Procedure Laterality Date  . BLEPHAROPLASTY     left eye  . CARDIAC CATHETERIZATION  01-09-12   1'13  . CATARACT  EXTRACTION, BILATERAL  ~ 2010  . CHOLECYSTECTOMY  1980's  . CORONARY ARTERY BYPASS GRAFT  11/26/2011   Procedure:x4- CORONARY ARTERY BYPASS GRAFTING (CABG);  Surgeon: Gaye Pollack, MD;  Location: [facility];  Service: Open Heart Surgery;  Laterality: N/A;  . LEFT HEART CATHETERIZATION WITH CORONARY ANGIOGRAM N/A 11/20/2011   Procedure: LEFT HEART CATHETERIZATION WITH CORONARY ANGIOGRAM;  Surgeon: Jettie Booze, MD;  Location: [facility] CATH LAB;  Service: Cardiovascular;  Laterality: N/A;  possible PCI       Home Medications    Prior to Admission medications   Medication Sig Start Date End Date Taking? Authorizing Provider  acetaminophen (TYLENOL) 325 MG tablet Take 650 mg by mouth every 6 (six) hours as needed for mild pain.   Yes Historical Provider, MD  aspirin 81 MG tablet Take 81 mg by mouth daily.   Yes Historical Provider, MD  atorvastatin (LIPITOR) 10 MG tablet Take 5 mg by mouth daily.   Yes Historical Provider, MD  CALCIUM-VITAMIN D PO Take 600 mg by mouth daily.   Yes Historical Provider, MD  ENSURE (ENSURE) Take 1 Can by mouth 2 (two) times daily.   Yes Historical Provider, MD  ferrous sulfate 325 (65 FE) MG tablet Take 325 mg by mouth 2 (two) times daily.    Yes Historical Provider, MD  furosemide (LASIX) 40 MG tablet Take 40 mg by  mouth daily after breakfast. May take extra dose of Lasix up to 2 times weekly for edema.   Yes Historical Provider, MD  methylcellulose (ARTIFICIAL TEARS) 1 % ophthalmic solution Place 2 drops into both eyes 4 (four) times daily.   Yes Historical Provider, MD  Multiple Vitamin (MULITIVITAMIN WITH MINERALS) TABS Take 1 tablet by mouth daily.   Yes Historical Provider, MD  pantoprazole (PROTONIX) 40 MG tablet Take 40 mg by mouth daily.   Yes Historical Provider, MD  potassium chloride SA (K-DUR,KLOR-CON) 20 MEQ tablet Take 2 tablets (40 mEq total) by mouth daily. Patient taking differently: Take 20 mEq by mouth 2 (two) times daily.  12/03/11 05/19/16 Yes Gina  L Collins, PA-C  tamsulosin (FLOMAX) 0.4 MG CAPS capsule TAKE 0.4 MG BY MOUTH DAILY 03/07/16  Yes Historical Provider, MD  atorvastatin (LIPITOR) 20 MG tablet TAKE 1/2 TABLET BY MOUTH ONCE DAILY Patient not taking: Reported on 05/19/2016 06/22/15   Jettie Booze, MD    Family History Family History  Problem Relation Age of Onset  . Heart attack Father   . Hypertension Neg Hx   . Stroke Neg Hx     Social History Social History  Substance Use Topics  . Smoking status: Former Smoker    Types: Cigars  . Smokeless tobacco: Former Systems developer    Types: Chew     Comment: "hadn't used chew or smoked cigars since 1990's  . Alcohol use No     Allergies   Review of patient's allergies indicates no known allergies.   Review of Systems Review of Systems  Unable to perform ROS: Dementia  Constitutional: Negative for fever.     Physical Exam Updated Vital Signs BP (!) 134/50   Pulse 109   Temp 97.7 F (36.5 C) (Oral)   Resp (!) 31   SpO2 100%   Physical Exam  Constitutional: He appears well-developed and well-nourished. No distress.  Nontoxic appearing.  HENT:  Head: Normocephalic and atraumatic.  Right Ear: External ear normal.  Left Ear: External ear normal.  Mouth/Throat: Oropharynx is clear and moist.  No visible signs of head trauma.  Eyes: Conjunctivae and EOM are normal. Pupils are equal, round, and reactive to light. Right eye exhibits no discharge. Left eye exhibits no discharge.  Neck: Neck supple.  Patient wearing c-collar.  Cardiovascular: Normal rate, regular rhythm, normal heart sounds and intact distal pulses.  Exam reveals no gallop and no friction rub.   No murmur heard. Pulmonary/Chest: Effort normal. No respiratory distress. He has no wheezes. He has no rales.  Lung sounds slightly diminished bilateral bases. No increased work of breathing.  Abdominal: Soft. Bowel sounds are normal. He exhibits no distension and no mass. There is tenderness. There is no  rebound and no guarding.  Abdomen is soft. Bowel sounds are present. Patient has mild tenderness over his left upper and lower abdomen. No fluid wave. No ascites.  Musculoskeletal: Normal range of motion. He exhibits no edema, tenderness or deformity.  Patient is spontaneously moving all extremities in a coordinated fashion exhibiting good strength.  Patient's bilateral shoulder, elbow, wrist, hip, knee, and ankle joints are supple and nontender to palpation. No midline neck or back tenderness to palpation.  Lymphadenopathy:    He has no cervical adenopathy.  Neurological: He is alert. Coordination normal.  Patient is alert and oriented to person and place. He has some confusion about the events leading up to his visit today. Speech is clear and coherent. Sensation intact his  bilateral upper and lower extremities.  Skin: Skin is warm and dry. Capillary refill takes less than 2 seconds. No rash noted. He is not diaphoretic. No erythema. No pallor.  Psychiatric: He has a normal mood and affect. His behavior is normal.  Nursing note and vitals reviewed.    ED Treatments / Results  Labs (all labs ordered are listed, but only abnormal results are displayed) Labs Reviewed  COMPREHENSIVE METABOLIC PANEL - Abnormal; Notable for the following:       Result Value   Sodium 127 (*)    Potassium 6.6 (*)    Chloride 95 (*)    CO2 17 (*)    Glucose, Bld 105 (*)    BUN 82 (*)    Creatinine, Ser 8.01 (*)    Calcium 8.8 (*)    GFR calc non Af Amer 5 (*)    GFR calc Af Amer 6 (*)    All other components within normal limits  CBC WITH DIFFERENTIAL/PLATELET - Abnormal; Notable for the following:    WBC 15.9 (*)    RBC 4.04 (*)    HCT 38.2 (*)    Neutro Abs 13.5 (*)    Lymphs Abs 0.6 (*)    Monocytes Absolute 1.8 (*)    All other components within normal limits  URINALYSIS, ROUTINE W REFLEX MICROSCOPIC (NOT AT [facility]) - Abnormal; Notable for the following:    Glucose, UA 100 (*)    Hgb urine  dipstick LARGE (*)    Leukocytes, UA TRACE (*)    All other components within normal limits  BRAIN NATRIURETIC PEPTIDE - Abnormal; Notable for the following:    B Natriuretic Peptide 363.2 (*)    All other components within normal limits  URINE MICROSCOPIC-ADD ON  I-STAT TROPOININ, ED    EKG  EKG Interpretation  Date/Time:  Sunday May 19 2016 10:55:42 EDT Ventricular Rate:  56 PR Interval:    QRS Duration: 100 QT Interval:  398 QTC Calculation: 385 R Axis:   60 Text Interpretation:  Sinus rhythm Prolonged PR interval Repol abnrm, severe global ischemia (LM/MVD) Abnormal ekg Confirmed by Audie Pinto  MD, ROBERT ([id number]) on 05/19/2016 11:18:02 AM       Radiology Dg Chest 2 View  Result Date: 05/19/2016 CLINICAL DATA:  82 year old male with dementia and unwitnessed fall EXAM: CHEST  2 VIEW COMPARISON:  Prior chest x-ray 07/20/2014 FINDINGS: Stable cardiomegaly. Patient is status post median sternotomy with evidence of prior multivessel CABG. Inspiratory volumes are low and there are bibasilar opacities likely reflecting atelectasis. Chronic bilateral calcified pleural plaques. Chronic right pleural effusion is similar compared to prior. No pneumothorax. Pulmonary vascular congestion appears accentuated by the low lung volumes. No acute osseous abnormality. IMPRESSION: 1. Low inspiratory volumes with bibasilar atelectasis and crowding of the pulmonary vasculature. 2. Chronic bilateral calcified pleural plaques suggest prior asbestos exposure. 3. Chronic right-sided pleural effusion is similar compared to prior. Electronically Signed   By: Jacqulynn Cadet M.D.   On: 05/19/2016 10:36  Ct Head Wo Contrast  Result Date: 05/19/2016 EXAM: CT HEAD WITHOUT CONTRAST CT CERVICAL SPINE WITHOUT CONTRAST TECHNIQUE: Multidetector CT imaging of the head and cervical spine was performed following the standard protocol without intravenous contrast. Multiplanar CT image reconstructions of the cervical spine  were also generated. COMPARISON:  04/12/2016. FINDINGS: CT HEAD FINDINGS Brain: Moderate low density in the periventricular white matter likely related to small vessel disease. Cerebellar and cerebral atrophy which is expected for age. No mass lesion, hemorrhage, hydrocephalus,  acute infarct, intra-axial, or extra-axial fluid collection. Vascular: Bilateral vertebral and carotid artery atherosclerosis. Skull: No significant soft tissue swelling.  No skull fracture. Sinuses/Orbits: Surgical changes about the globes. Clear paranasal sinuses and mastoid air cells. Other: None. CT CERVICAL SPINE FINDINGS Spinal visualization through the bottom of T2. Prevertebral soft tissues are within normal limits. Bilateral carotid atherosclerosis. No apical pneumothorax. Multilevel cervical spondylosis, resulting in areas of central canal and neural foraminal narrowing. Most significant at C4-5, C5-6, and C6-7. Skull base intact. Maintenance of vertebral body height. Straightening of expected cervical lordosis. Presumed degenerative fusion of C3 through C5. Near complete degenerative fusion at C6-7. Trace C7-T1 anterolisthesis is not significantly changed. Multilevel facet arthropathy is worse on the right. Facets are well-aligned. Coronal reformats demonstrate only degenerative change about the C1-2 articulation, primarily left-sided. IMPRESSION: 1. No acute intracranial abnormality. Cerebral atrophy and small vessel ischemic change. 2. Advanced cervical spondylosis, without acute fracture or subluxation. Electronically Signed   By: Abigail Miyamoto M.D.   On: 05/19/2016 10:50  Ct Cervical Spine Wo Contrast  Result Date: 05/19/2016 EXAM: CT HEAD WITHOUT CONTRAST CT CERVICAL SPINE WITHOUT CONTRAST TECHNIQUE: Multidetector CT imaging of the head and cervical spine was performed following the standard protocol without intravenous contrast. Multiplanar CT image reconstructions of the cervical spine were also generated. COMPARISON:   04/12/2016. FINDINGS: CT HEAD FINDINGS Brain: Moderate low density in the periventricular white matter likely related to small vessel disease. Cerebellar and cerebral atrophy which is expected for age. No mass lesion, hemorrhage, hydrocephalus, acute infarct, intra-axial, or extra-axial fluid collection. Vascular: Bilateral vertebral and carotid artery atherosclerosis. Skull: No significant soft tissue swelling.  No skull fracture. Sinuses/Orbits: Surgical changes about the globes. Clear paranasal sinuses and mastoid air cells. Other: None. CT CERVICAL SPINE FINDINGS Spinal visualization through the bottom of T2. Prevertebral soft tissues are within normal limits. Bilateral carotid atherosclerosis. No apical pneumothorax. Multilevel cervical spondylosis, resulting in areas of central canal and neural foraminal narrowing. Most significant at C4-5, C5-6, and C6-7. Skull base intact. Maintenance of vertebral body height. Straightening of expected cervical lordosis. Presumed degenerative fusion of C3 through C5. Near complete degenerative fusion at C6-7. Trace C7-T1 anterolisthesis is not significantly changed. Multilevel facet arthropathy is worse on the right. Facets are well-aligned. Coronal reformats demonstrate only degenerative change about the C1-2 articulation, primarily left-sided. IMPRESSION: 1. No acute intracranial abnormality. Cerebral atrophy and small vessel ischemic change. 2. Advanced cervical spondylosis, without acute fracture or subluxation. Electronically Signed   By: Abigail Miyamoto M.D.   On: 05/19/2016 10:50   Procedures Procedures (including critical care time)  Medications Ordered in ED Medications  albuterol (PROVENTIL) (2.5 MG/3ML) 0.083% nebulizer solution 10 mg (10 mg Nebulization Not Given 05/19/16 1130)  albuterol (PROVENTIL) (2.5 MG/3ML) 0.083% nebulizer solution (  Not Given 05/19/16 1145)  sodium chloride 0.9 % bolus 500 mL (0 mLs Intravenous Stopped 05/19/16 1300)  insulin aspart  (novoLOG) injection 10 Units (10 Units Intravenous Given 05/19/16 1205)  dextrose 50 % solution 50 mL (50 mLs Intravenous Given 05/19/16 1157)  sodium bicarbonate injection 50 mEq (50 mEq Intravenous Given 05/19/16 1202)  albuterol (PROVENTIL, VENTOLIN) (5 MG/ML) 0.5% continuous inhalation solution (  Given 05/19/16 1149)     Initial Impression / Assessment and Plan / ED Course  I have reviewed the triage vital signs and the nursing notes.  Pertinent labs & imaging results that were available during my care of the patient were reviewed by me and considered in my medical decision  making (see chart for details).  Clinical Course  Comment By Time  At bedside with patient and Dr. Audie Pinto. Just received notification about elevated potassium. Bladder scan at bedside reveals 888 mls of urine in bladder. Will insert foley cath. Hyperkalemia orders started. Patient already on monitor. Plan for admission  Waynetta Pean, PA-C 07/23 1115  Consulted with urologist Dr. Junious Silk. Spoke about hospitalist admission. He will see the patient in follow up in office for removal of the foley cath after discharge.  Waynetta Pean, PA-C 07/23 1334   Patient with acute renal failure with a creatinine of 8.08. He has a postobstructive uropathy that has been fixed with Foley catheter. We have obtained partially 1500 mils of urine from his bladder at this point. He has hyperkalemia with a potassium 6.6. He received an albuterol treatment, insulin, glucose, and sodium bicarbonate to help lower his hyperkalemia. No signs of hyperkalemia on EKG. His troponin is not elevated. Urinalysis shows no signs of infection. CT head and neck images showed no acute findings. CXR shows Low inspiratory volumes with bibasilar atelectasis and crowding of the pulmonary vasculature.  Chronic right-sided pleural effusion is similar compared to prior.  Patient reports feeling much better after Foley catheter. His abdominal pain has resolved.  Patient and family are in agreement with admission. I consulted with hospitalist Dr. Hartford Poli who accepted the patient for admission and requested tele temporary admission orders.   This patient was discussed with and evaluated by Dr. Audie Pinto who agrees with assessment and plan.   CRITICAL CARE Performed by: Hanley Hays   Total critical care time: 45 minutes  Critical care time was exclusive of separately billable procedures and treating other patients.  Critical care was necessary to treat or prevent imminent or life-threatening deterioration.  Critical care was time spent personally by me on the following activities: development of treatment plan with patient and/or surrogate as well as nursing, discussions with consultants, evaluation of patient's response to treatment, examination of patient, obtaining history from patient or surrogate, ordering and performing treatments and interventions, ordering and review of laboratory studies, ordering and review of radiographic studies, pulse oximetry and re-evaluation of patient's condition.   Final Clinical Impressions(s) / ED Diagnoses   Final diagnoses:  Acute renal failure, unspecified acute renal failure type ([place])  Obstructive uropathy  Fall, initial encounter  Hyperkalemia    New Prescriptions New Prescriptions   No medications on file     Waynetta Pean, PA-C 05/19/16 1412    Leonard Schwartz, MD 05/19/16 1535

## 2016-05-19 NOTE — ED Notes (Signed)
Unable to collect labs at this time PT in xray

## 2016-05-19 NOTE — ED Notes (Signed)
Primary RN and Will PA made aware of critical Potassium 6.6.

## 2016-05-19 NOTE — ED Notes (Signed)
Bed: [id number] Expected date: 05/19/16 Expected time: 9:06 AM Means of arrival:  Comments: fall

## 2016-05-19 NOTE — ED Notes (Signed)
Dr. Audie Pinto has just spoken with pt. And his family about need to admit. My colleague is attempting IV as I write this.

## 2016-05-19 NOTE — Consult Note (Signed)
Consult: urinary retention Requested by: Dr. Audie Pinto  History of Present Illness: 82 yo WM who follows with Dr. Gaynelle Arabian for BPH and elevated PSA presented after falling in urinary retention. Pt was having trouble voiding at home. Here his Cr was 8 and a foley was placed and drained 900 ml. Now pt has about 2L in foley bag.   Pt was seen Aug 2016 in office with minimal LUTS and a PVR = 13 ml. He has been on tamsulosin.    Past Medical History:  Diagnosis Date  . Acid reflux   . Angina   . Arthritis 01-09-12   hands, foot  . Benign prostatic hypertrophy   . Bradycardia   . Diabetes mellitus   . Edema   . Embolism - blood clot ~ 2000   "behind left knee"  . History of asbestos exposure    "have some lung disease from years of exposure"  . Myocardial infarction ([place]) 11/17/10    NSTEMI  . Prostate hypertrophy 01-09-12   urinary retention-Foley catheter at present; surgery planned   Past Surgical History:  Procedure Laterality Date  . BLEPHAROPLASTY     left eye  . CARDIAC CATHETERIZATION  01-09-12   1'13  . CATARACT EXTRACTION, BILATERAL  ~ 2010  . CHOLECYSTECTOMY  1980's  . CORONARY ARTERY BYPASS GRAFT  11/26/2011   Procedure:x4- CORONARY ARTERY BYPASS GRAFTING (CABG);  Surgeon: Gaye Pollack, MD;  Location: [facility];  Service: Open Heart Surgery;  Laterality: N/A;  . LEFT HEART CATHETERIZATION WITH CORONARY ANGIOGRAM N/A 11/20/2011   Procedure: LEFT HEART CATHETERIZATION WITH CORONARY ANGIOGRAM;  Surgeon: Jettie Booze, MD;  Location: [facility] CATH LAB;  Service: Cardiovascular;  Laterality: N/A;  possible PCI    Home Medications:   (Not in a hospital admission) Allergies: No Known Allergies  Family History  Problem Relation Age of Onset  . Heart attack Father   . Hypertension Neg Hx   . Stroke Neg Hx    Social History:  reports that he has quit smoking. His smoking use included Cigars. He has quit using smokeless tobacco. His smokeless tobacco use included Chew. He  reports that he does not drink alcohol or use drugs.  ROS: A complete review of systems was performed.  All systems are negative except for pertinent findings as noted. ROS   Physical Exam:  Vital signs in last 24 hours: Temp:  [97.7 F (36.5 C)-98.4 F (36.9 C)] 98.4 F (36.9 C) (07/23 1415) Pulse Rate:  [49-112] 112 (07/23 1345) Resp:  [15-31] 16 (07/23 1415) BP: (134-162)/(49-86) 142/77 (07/23 1400) SpO2:  [97 %-100 %] 97 % (07/23 1345) General:  Alert and oriented, No acute distress HEENT: Normocephalic, atraumatic Lungs: Regular rate and effort Abdomen: Soft, nontender, nondistended, no abdominal masses Back: No CVA tenderness Extremities: No edema Neurologic: Grossly intact GU: copious clear urine   Laboratory Data:  Results for orders placed or performed during the hospital encounter of 05/19/16 (from the past 24 hour(s))  Urinalysis, Routine w reflex microscopic     Status: Abnormal   Collection Time: 05/19/16  9:57 AM  Result Value Ref Range   Color, Urine YELLOW YELLOW   APPearance CLEAR CLEAR   Specific Gravity, Urine 1.009 1.005 - 1.030   pH 5.5 5.0 - 8.0   Glucose, UA 100 (A) NEGATIVE mg/dL   Hgb urine dipstick LARGE (A) NEGATIVE   Bilirubin Urine NEGATIVE NEGATIVE   Ketones, ur NEGATIVE NEGATIVE mg/dL   Protein, ur NEGATIVE NEGATIVE mg/dL  Nitrite NEGATIVE NEGATIVE   Leukocytes, UA TRACE (A) NEGATIVE  Urine microscopic-add on     Status: None   Collection Time: 05/19/16  9:57 AM  Result Value Ref Range   Squamous Epithelial / LPF NONE SEEN NONE SEEN   WBC, UA 0-5 0 - 5 WBC/hpf   RBC / HPF 0-5 0 - 5 RBC/hpf   Bacteria, UA NONE SEEN NONE SEEN  Comprehensive metabolic panel     Status: Abnormal   Collection Time: 05/19/16 10:36 AM  Result Value Ref Range   Sodium 127 (L) 135 - 145 mmol/L   Potassium 6.6 (HH) 3.5 - 5.1 mmol/L   Chloride 95 (L) 101 - 111 mmol/L   CO2 17 (L) 22 - 32 mmol/L   Glucose, Bld 105 (H) 65 - 99 mg/dL   BUN 82 (H) 6 - 20 mg/dL    Creatinine, Ser 8.01 (H) 0.61 - 1.24 mg/dL   Calcium 8.8 (L) 8.9 - 10.3 mg/dL   Total Protein 7.2 6.5 - 8.1 g/dL   Albumin 4.1 3.5 - 5.0 g/dL   AST 19 15 - 41 U/L   ALT 17 17 - 63 U/L   Alkaline Phosphatase 60 38 - 126 U/L   Total Bilirubin 0.8 0.3 - 1.2 mg/dL   GFR calc non Af Amer 5 (L) >60 mL/min   GFR calc Af Amer 6 (L) >60 mL/min   Anion gap 15 5 - 15  CBC with Differential     Status: Abnormal   Collection Time: 05/19/16 10:36 AM  Result Value Ref Range   WBC 15.9 (H) 4.0 - 10.5 K/uL   RBC 4.04 (L) 4.22 - 5.81 MIL/uL   Hemoglobin 13.1 13.0 - 17.0 g/dL   HCT 38.2 (L) 39.0 - 52.0 %   MCV 94.6 78.0 - 100.0 fL   MCH 32.4 26.0 - 34.0 pg   MCHC 34.3 30.0 - 36.0 g/dL   RDW 13.6 11.5 - 15.5 %   Platelets 151 150 - 400 K/uL   Neutrophils Relative % 84 %   Neutro Abs 13.5 (H) 1.7 - 7.7 K/uL   Lymphocytes Relative 4 %   Lymphs Abs 0.6 (L) 0.7 - 4.0 K/uL   Monocytes Relative 12 %   Monocytes Absolute 1.8 (H) 0.1 - 1.0 K/uL   Eosinophils Relative 0 %   Eosinophils Absolute 0.0 0.0 - 0.7 K/uL   Basophils Relative 0 %   Basophils Absolute 0.0 0.0 - 0.1 K/uL  Brain natriuretic peptide     Status: Abnormal   Collection Time: 05/19/16 10:37 AM  Result Value Ref Range   B Natriuretic Peptide 363.2 (H) 0.0 - 100.0 pg/mL  I-stat troponin, ED     Status: None   Collection Time: 05/19/16 10:44 AM  Result Value Ref Range   Troponin i, poc 0.02 0.00 - 0.08 ng/mL   Comment 3           No results found for this or any previous visit (from the past 240 hour(s)). Creatinine:  Recent Labs  05/19/16 1036  CREATININE 8.01*    Impression/Assessment/plan: BPH, Urinary retention, ARF -  Excellent UOP after foley placed. Suspect renal fxn will quickly improve. Watch for post-obstructive diuresis, dehydration and/or rapid electrolyte shifts. I'll notify Dr. Gaynelle Arabian of admission.   Oswin Johal 05/19/2016, 2:51 PM

## 2016-05-19 NOTE — ED Triage Notes (Signed)
He was found by staff at [place] [place] to have fallen (unwitnessed). Staff responded immediately and found him to be alert and at his baseline mentation (hx of dementia).  He arrives here in no distress c/o some neck soreness and left-sided abd. And flank area discomfort.

## 2016-05-19 NOTE — H&P (Signed)
History and Physical    Ryan Pacheco [id number] DOB: [birth year] DOA: 05/19/2016  PCP: [name], MD  Patient coming from: SNF  Chief Complaint: ARF and hyperkalemia  HPI: Ryan Pacheco is a 82 y.o. male with medical history significant of BPH, remote history of non-STEMI and arthritis. Patient lives in a nursing home and came to the hospital because he fell. He was found to have acute renal failure and hyperkalemia. Reportedly he was weaker and had an attended Chol, CT of the head/neck showed no evidence of trauma but findings showed severe ARF with creatinine of 8 and potassium of 6.6. Foley catheter placed and patient already diuresed 2 L of urine so far.  ED Course:  Vitals: Stable Labs: Sodium 127 potassium 6.6 BUN is 82 and creatinine 8.0. Imaging: CT head/neck without acute findings. CXR no acute findings. Interventions: Foley catheter placed with good diuresis of 2 L, given dextrose/insulin and sodium bicarbonate for the hyperkalemia  Review of Systems:  Constitutional: negative for anorexia, fevers and sweats Eyes: negative for irritation, redness and visual disturbance Ears, nose, mouth, throat, and face: negative for earaches, epistaxis, nasal congestion and sore throat Respiratory: negative for cough, dyspnea on exertion, sputum and wheezing Cardiovascular: negative for chest pain, dyspnea, lower extremity edema, orthopnea, palpitations and syncope Gastrointestinal: negative for abdominal pain, constipation, diarrhea, melena, nausea and vomiting Genitourinary:negative for dysuria, frequency and hematuria Hematologic/lymphatic: negative for bleeding, easy bruising and lymphadenopathy Musculoskeletal:negative for arthralgias, muscle weakness and stiff joints Neurological: negative for coordination problems, gait problems, headaches and weakness Endocrine: negative for diabetic symptoms including polydipsia, polyuria and weight loss Allergic/Immunologic:  negative for anaphylaxis, hay fever and urticaria  Past Medical History:  Diagnosis Date  . Acid reflux   . Angina   . Arthritis 01-09-12   hands, foot  . Benign prostatic hypertrophy   . Bradycardia   . Diabetes mellitus   . Edema   . Embolism - blood clot ~ 2000   "behind left knee"  . History of asbestos exposure    "have some lung disease from years of exposure"  . Myocardial infarction ([place]) 11/17/10    NSTEMI  . Prostate hypertrophy 01-09-12   urinary retention-Foley catheter at present; surgery planned    Past Surgical History:  Procedure Laterality Date  . BLEPHAROPLASTY     left eye  . CARDIAC CATHETERIZATION  01-09-12   1'13  . CATARACT EXTRACTION, BILATERAL  ~ 2010  . CHOLECYSTECTOMY  1980's  . CORONARY ARTERY BYPASS GRAFT  11/26/2011   Procedure:x4- CORONARY ARTERY BYPASS GRAFTING (CABG);  Surgeon: Gaye Pollack, MD;  Location: [facility] [facility];  Service: Open Heart Surgery;  Laterality: N/A;  . LEFT HEART CATHETERIZATION WITH CORONARY ANGIOGRAM N/A 11/20/2011   Procedure: LEFT HEART CATHETERIZATION WITH CORONARY ANGIOGRAM;  Surgeon: Jettie Booze, MD;  Location: [facility] C[facility];  Service: Cardiovascular;  Laterality: N/A;  possible PCI     reports that he has quit smoking. His smoking use included Cigars. He has quit using smokeless tobacco. His smokeless tobacco use included Chew. He reports that he does not drink alcohol or use drugs.  No Known Allergies  Family History  Problem Relation Age of Onset  . Heart attack Father   . Hypertension Neg Hx   . Stroke Neg Hx     Prior to Admission medications   Medication Sig Start Date End Date Taking? Authorizing Provider  acetaminophen (TYLENOL) 325 MG tablet Take 650 mg by mouth every 6 (six) hours as  needed for mild pain.   Yes Historical Provider, MD  aspirin 81 MG tablet Take 81 mg by mouth daily.   Yes Historical Provider, MD  atorvastatin (LIPITOR) 10 MG tablet Take 5 mg by mouth daily.   Yes Historical Provider, MD    CALCIUM-VITAMIN D PO Take 600 mg by mouth daily.   Yes Historical Provider, MD  ENSURE (ENSURE) Take 1 Can by mouth 2 (two) times daily.   Yes Historical Provider, MD  ferrous sulfate 325 (65 FE) MG tablet Take 325 mg by mouth 2 (two) times daily.    Yes Historical Provider, MD  furosemide (LASIX) 40 MG tablet Take 40 mg by mouth daily after breakfast. May take extra dose of Lasix up to 2 times weekly for edema.   Yes Historical Provider, MD  methylcellulose (ARTIFICIAL TEARS) 1 % ophthalmic solution Place 2 drops into both eyes 4 (four) times daily.   Yes Historical Provider, MD  Multiple Vitamin (MULITIVITAMIN WITH MINERALS) TABS Take 1 tablet by mouth daily.   Yes Historical Provider, MD  pantoprazole (PROTONIX) 40 MG tablet Take 40 mg by mouth daily.   Yes Historical Provider, MD  potassium chloride SA (K-DUR,KLOR-CON) 20 MEQ tablet Take 2 tablets (40 mEq total) by mouth daily. Patient taking differently: Take 20 mEq by mouth 2 (two) times daily.  12/03/11 05/19/16 Yes [name], PA-C  tamsulosin (FLOMAX) 0.4 MG CAPS capsule TAKE 0.4 MG BY MOUTH DAILY 03/07/16  Yes Historical Provider, MD  atorvastatin (LIPITOR) 20 MG tablet TAKE 1/2 TABLET BY MOUTH ONCE DAILY Patient not taking: Reported on 05/19/2016 06/22/15   Jettie Booze, MD    Physical Exam:  Vitals:   05/19/16 1330 05/19/16 1345 05/19/16 1400 05/19/16 1415  BP: (!) 162/49  142/77   Pulse: 108 112    Resp: 17 21 15 16   Temp: 98.2 F (36.8 C) 98.2 F (36.8 C) 98.4 F (36.9 C) 98.4 F (36.9 C)  TempSrc:      SpO2: 97% 97%      Constitutional: NAD, calm, comfortable Eyes: PERRL, lids and conjunctivae normal ENMT: Mucous membranes are moist. Posterior pharynx clear of any exudate or lesions.Normal dentition.  Neck: normal, supple, no masses, no thyromegaly Respiratory: clear to auscultation bilaterally, no wheezing, no crackles. Normal respiratory effort. No accessory muscle use.  Cardiovascular: Regular rate and  rhythm, no murmurs / rubs / gallops. No extremity edema. 2+ pedal pulses. No carotid bruits.  Abdomen: no tenderness, no masses palpated. No hepatosplenomegaly. Bowel sounds positive.  Musculoskeletal: no clubbing / cyanosis. No joint deformity upper and lower extremities. Good ROM, no contractures. Normal muscle tone.  Skin: no rashes, lesions, ulcers. No induration Neurologic: CN 2-12 grossly intact. Sensation intact, DTR normal. Strength 5/5 in all 4.  Psychiatric: Normal judgment and insight. Alert and oriented x 3. Normal mood.   Labs on Admission: I have personally reviewed following labs and imaging studies  CBC:  Recent Labs Lab 05/19/16 1036  WBC 15.9*  NEUTROABS 13.5*  HGB 13.1  HCT 38.2*  MCV 94.6  PLT 123XX123   Basic Metabolic Panel:  Recent Labs Lab 05/19/16 1036  NA 127*  K 6.6*  CL 95*  CO2 17*  GLUCOSE 105*  BUN 82*  CREATININE 8.01*  CALCIUM 8.8*   GFR: CrCl cannot be calculated (Unknown ideal weight.). Liver Function Tests:  Recent Labs Lab 05/19/16 1036  AST 19  ALT 17  ALKPHOS 60  BILITOT 0.8  PROT 7.2  ALBUMIN 4.1  No results for input(s): LIPASE, AMYLASE in the last 168 hours. No results for input(s): AMMONIA in the last 168 hours. Coagulation Profile: No results for input(s): INR, PROTIME in the last 168 hours. Cardiac Enzymes: No results for input(s): CKTOTAL, CKMB, CKMBINDEX, TROPONINI in the last 168 hours. BNP (last 3 results) No results for input(s): PROBNP in the last 8760 hours. HbA1C: No results for input(s): HGBA1C in the last 72 hours. CBG: No results for input(s): GLUCAP in the last 168 hours. Lipid Profile: No results for input(s): CHOL, HDL, LDLCALC, TRIG, CHOLHDL, LDLDIRECT in the last 72 hours. Thyroid Function Tests: No results for input(s): TSH, T4TOTAL, FREET4, T3FREE, THYROIDAB in the last 72 hours. Anemia Panel: No results for input(s): VITAMINB12, FOLATE, FERRITIN, TIBC, IRON, RETICCTPCT in the last 72  hours. Urine analysis:    Component Value Date/Time   COLORURINE YELLOW 05/19/2016 0957   APPEARANCEUR CLEAR 05/19/2016 0957   LABSPEC 1.009 05/19/2016 0957   PHURINE 5.5 05/19/2016 0957   GLUCOSEU 100 (A) 05/19/2016 0957   HGBUR LARGE (A) 05/19/2016 0957   BILIRUBINUR NEGATIVE 05/19/2016 0957   KETONESUR NEGATIVE 05/19/2016 0957   PROTEINUR NEGATIVE 05/19/2016 0957   UROBILINOGEN 1.0 12/01/2011 0835   NITRITE NEGATIVE 05/19/2016 0957   LEUKOCYTESUR TRACE (A) 05/19/2016 0957   Sepsis Labs: !!!!!!!!!!!!!!!!!!!!!!!!!!!!!!!!!!!!!!!!!!!! Invalid input(s): PROCALCITONIN, LACTICIDVEN No results found for this or any previous visit (from the past 240 hour(s)).   Radiological Exams on Admission: Dg Chest 2 View  Result Date: 05/19/2016 CLINICAL DATA:  82 year old male with dementia and unwitnessed fall EXAM: CHEST  2 VIEW COMPARISON:  Prior chest x-ray 07/20/2014 FINDINGS: Stable cardiomegaly. Patient is status post median sternotomy with evidence of prior multivessel CABG. Inspiratory volumes are low and there are bibasilar opacities likely reflecting atelectasis. Chronic bilateral calcified pleural plaques. Chronic right pleural effusion is similar compared to prior. No pneumothorax. Pulmonary vascular congestion appears accentuated by the low lung volumes. No acute osseous abnormality. IMPRESSION: 1. Low inspiratory volumes with bibasilar atelectasis and crowding of the pulmonary vasculature. 2. Chronic bilateral calcified pleural plaques suggest prior asbestos exposure. 3. Chronic right-sided pleural effusion is similar compared to prior. Electronically Signed   By: Jacqulynn Cadet M.D.   On: 05/19/2016 10:36  Ct Head Wo Contrast  Result Date: 05/19/2016 EXAM: CT HEAD WITHOUT CONTRAST CT CERVICAL SPINE WITHOUT CONTRAST TECHNIQUE: Multidetector CT imaging of the head and cervical spine was performed following the standard protocol without intravenous contrast. Multiplanar CT image  reconstructions of the cervical spine were also generated. COMPARISON:  04/12/2016. FINDINGS: CT HEAD FINDINGS Brain: Moderate low density in the periventricular white matter likely related to small vessel disease. Cerebellar and cerebral atrophy which is expected for age. No mass lesion, hemorrhage, hydrocephalus, acute infarct, intra-axial, or extra-axial fluid collection. Vascular: Bilateral vertebral and carotid artery atherosclerosis. Skull: No significant soft tissue swelling.  No skull fracture. Sinuses/Orbits: Surgical changes about the globes. Clear paranasal sinuses and mastoid air cells. Other: None. CT CERVICAL SPINE FINDINGS Spinal visualization through the bottom of T2. Prevertebral soft tissues are within normal limits. Bilateral carotid atherosclerosis. No apical pneumothorax. Multilevel cervical spondylosis, resulting in areas of central canal and neural foraminal narrowing. Most significant at C4-5, C5-6, and C6-7. Skull base intact. Maintenance of vertebral body height. Straightening of expected cervical lordosis. Presumed degenerative fusion of C3 through C5. Near complete degenerative fusion at C6-7. Trace C7-T1 anterolisthesis is not significantly changed. Multilevel facet arthropathy is worse on the right. Facets are well-aligned. Coronal reformats demonstrate only  degenerative change about the C1-2 articulation, primarily left-sided. IMPRESSION: 1. No acute intracranial abnormality. Cerebral atrophy and small vessel ischemic change. 2. Advanced cervical spondylosis, without acute fracture or subluxation. Electronically Signed   By: Abigail Miyamoto M.D.   On: 05/19/2016 10:50  Ct Cervical Spine Wo Contrast  Result Date: 05/19/2016 EXAM: CT HEAD WITHOUT CONTRAST CT CERVICAL SPINE WITHOUT CONTRAST TECHNIQUE: Multidetector CT imaging of the head and cervical spine was performed following the standard protocol without intravenous contrast. Multiplanar CT image reconstructions of the cervical  spine were also generated. COMPARISON:  04/12/2016. FINDINGS: CT HEAD FINDINGS Brain: Moderate low density in the periventricular white matter likely related to small vessel disease. Cerebellar and cerebral atrophy which is expected for age. No mass lesion, hemorrhage, hydrocephalus, acute infarct, intra-axial, or extra-axial fluid collection. Vascular: Bilateral vertebral and carotid artery atherosclerosis. Skull: No significant soft tissue swelling.  No skull fracture. Sinuses/Orbits: Surgical changes about the globes. Clear paranasal sinuses and mastoid air cells. Other: None. CT CERVICAL SPINE FINDINGS Spinal visualization through the bottom of T2. Prevertebral soft tissues are within normal limits. Bilateral carotid atherosclerosis. No apical pneumothorax. Multilevel cervical spondylosis, resulting in areas of central canal and neural foraminal narrowing. Most significant at C4-5, C5-6, and C6-7. Skull base intact. Maintenance of vertebral body height. Straightening of expected cervical lordosis. Presumed degenerative fusion of C3 through C5. Near complete degenerative fusion at C6-7. Trace C7-T1 anterolisthesis is not significantly changed. Multilevel facet arthropathy is worse on the right. Facets are well-aligned. Coronal reformats demonstrate only degenerative change about the C1-2 articulation, primarily left-sided. IMPRESSION: 1. No acute intracranial abnormality. Cerebral atrophy and small vessel ischemic change. 2. Advanced cervical spondylosis, without acute fracture or subluxation. Electronically Signed   By: Abigail Miyamoto M.D.   On: 05/19/2016 10:50   EKG: Independently reviewed.   Assessment/Plan Principal Problem:   Acute renal failure (ARF) (HCC) Active Problems:   Bladder outlet obstruction   Hyperkalemia   Frequent falls   Acute renal failure -Presented with creatinine of 8.0, baseline 1.2 from 6/17, this is likely secondary to bladder outlet obstruction. -Foley catheter placed, a  2 L of urine produced so far. -Placed on IV fluids as he will probably have fast diuresis as recovery from post renal ARF. -Expect this to improve quickly, if not improving can consider nephrology consult. -Check BMP daily and magnesium every other day.  Hyperkalemia -Secondary to acute renal failure and potassium supplements, patient given bicarbonate and insulin. -No obvious EKG changes, check BMP in a.m. -Hold on Kayexalate as patient starts to have very good diuresis, probably potassium level will be normal quickly.  BPH/bladder outlet obstruction -Foley catheter placed, seen by urology, Dr. Gaynelle Arabian to evaluate in a.m. -He is on tamsulosin, finasteride added. Await urology recommendation.  Frequent falls -Likely regrow back to the nursing home, head/neck CT did not show acute findings.   DVT prophylaxis: SQ Heparin Code Status: Full code Family Communication: Plan D/W patient Disposition Plan: SNF Consults called: Urology Admission status: Inpatient   [facility] A MD [facility] Pager [phone]  If 7PM-7AM, please contact night-coverage [email or web address] Password [facility]  05/19/2016, 3:01 PM

## 2016-05-20 ENCOUNTER — Inpatient Hospital Stay (HOSPITAL_COMMUNITY): Payer: Medicare Other

## 2016-05-20 DIAGNOSIS — N170 Acute kidney failure with tubular necrosis: Secondary | ICD-10-CM

## 2016-05-20 LAB — BASIC METABOLIC PANEL
ANION GAP: 7 (ref 5–15)
Anion gap: 7 (ref 5–15)
BUN: 47 mg/dL — AB (ref 6–20)
BUN: 54 mg/dL — AB (ref 6–20)
CHLORIDE: 110 mmol/L (ref 101–111)
CHLORIDE: 111 mmol/L (ref 101–111)
CO2: 21 mmol/L — AB (ref 22–32)
CO2: 21 mmol/L — AB (ref 22–32)
CREATININE: 3.72 mg/dL — AB (ref 0.61–1.24)
Calcium: 8.4 mg/dL — ABNORMAL LOW (ref 8.9–10.3)
Calcium: 8.6 mg/dL — ABNORMAL LOW (ref 8.9–10.3)
Creatinine, Ser: 2.8 mg/dL — ABNORMAL HIGH (ref 0.61–1.24)
GFR calc Af Amer: 15 mL/min — ABNORMAL LOW (ref >60)
GFR calc Af Amer: 21 mL/min — ABNORMAL LOW (ref >60)
GFR calc non Af Amer: 13 mL/min — ABNORMAL LOW (ref >60)
GFR calc non Af Amer: 18 mL/min — ABNORMAL LOW (ref >60)
GLUCOSE: 97 mg/dL (ref 65–99)
Glucose, Bld: 90 mg/dL (ref 65–99)
POTASSIUM: 4.2 mmol/L (ref 3.5–5.1)
POTASSIUM: 4.4 mmol/L (ref 3.5–5.1)
SODIUM: 138 mmol/L (ref 135–145)
Sodium: 139 mmol/L (ref 135–145)

## 2016-05-20 LAB — GLUCOSE, CAPILLARY: Glucose-Capillary: 134 mg/dL — ABNORMAL HIGH (ref 65–99)

## 2016-05-20 LAB — CBC
HEMATOCRIT: 31.7 % — AB (ref 39.0–52.0)
HEMOGLOBIN: 10.4 g/dL — AB (ref 13.0–17.0)
MCH: 31.6 pg (ref 26.0–34.0)
MCHC: 32.8 g/dL (ref 30.0–36.0)
MCV: 96.4 fL (ref 78.0–100.0)
Platelets: 144 10*3/uL — ABNORMAL LOW (ref 150–400)
RBC: 3.29 MIL/uL — ABNORMAL LOW (ref 4.22–5.81)
RDW: 14.1 % (ref 11.5–15.5)
WBC: 9.2 10*3/uL (ref 4.0–10.5)

## 2016-05-20 LAB — HEMOGLOBIN A1C
Hgb A1c MFr Bld: 5.6 % (ref 4.8–5.6)
Mean Plasma Glucose: 114 mg/dL

## 2016-05-20 LAB — TROPONIN I
TROPONIN I: 0.04 ng/mL — AB (ref <0.03)
TROPONIN I: 0.05 ng/mL — AB (ref <0.03)
Troponin I: 0.04 ng/mL (ref <0.03)

## 2016-05-20 LAB — MAGNESIUM: Magnesium: 2.4 mg/dL (ref 1.7–2.4)

## 2016-05-20 MED ORDER — SODIUM CHLORIDE 0.9 % IV SOLN: INTRAVENOUS | Status: AC

## 2016-05-20 MED ORDER — CETYLPYRIDINIUM CHLORIDE 0.05 % MT LIQD
7.0000 mL | Freq: Two times a day (BID) | OROMUCOSAL | Status: DC
  Administered 2016-05-20 – 2016-05-21 (×4): 7 mL via OROMUCOSAL

## 2016-05-20 MED ORDER — CHLORHEXIDINE GLUCONATE 0.12 % MT SOLN
15.0000 mL | Freq: Two times a day (BID) | OROMUCOSAL | Status: DC
  Administered 2016-05-20 – 2016-05-22 (×6): 15 mL via OROMUCOSAL
  Filled 2016-05-20 (×6): qty 15

## 2016-05-20 MED ORDER — SODIUM CHLORIDE 0.9 % IV SOLN: INTRAVENOUS | Status: DC

## 2016-05-20 NOTE — Progress Notes (Signed)
Bladder scan done and resulted 664 ml urine. Interventions to follow... Blair Hailey, RN

## 2016-05-20 NOTE — Progress Notes (Signed)
PROGRESS NOTE                                                                                                                                                                                                             Patient Demographics:    Ryan Pacheco, is a 82 y.o. male, DOB - 12-Nov-1919, [id number]  Admit date - 05/19/2016   Admitting Physician Verlee Monte, MD  Outpatient Primary MD for the patient is [name], MD  LOS - 1  Chief Complaint  Patient presents with  . Fall       Brief Narrative    Ryan Pacheco is a 82 y.o. male with medical history significant of BPH, remote history of non-STEMI and arthritis. Patient lives in a nursing home and came to the hospital because he fell. He was found to have acute renal failure and hyperkalemia. Reportedly he was weaker and had an attended Chol, CT of the head/neck showed no evidence of trauma but findings showed severe ARF with creatinine of 8 and potassium of 6.6. Foley catheter placed and patient already diuresed 2 L of urine so far.  ED Course:  Vitals: Stable Labs: Sodium 127 potassium 6.6 BUN is 82 and creatinine 8.0. Imaging: CT head/neck without acute findings. CXR no acute findings. Interventions: Foley catheter placed with good diuresis of 2 L, given dextrose/insulin and sodium bicarbonate for the hyperkalemia    Subjective:    Ryan Pacheco today has, No headache, No chest pain, No abdominal pain - No Nausea, No new weakness tingling or numbness, No Cough - SOB.     Assessment  & Plan :     1.ARF due to bladder outlet obstruction likely from BPH. Much improved after Foley catheter placement in the ER, good urine output, creatinine and potassium are much improved, continue gentle hydration, continue Flomax and finasteride, have patient on atenolol will discuss over the phone. Likely will require Foley catheter for several weeks if not  permanently.  2. BPH. As above.  3. Frequent falls with generalized deconditioning and weakness. Lives at an assisted living, walks minimally with a walker, PT eval. May require SNF.  4. Dyslipidemia. On statin.  5. GERD. On PPI.   Family Communication  :  Daughter  Code Status :  DO NOT RESUSCITATE confirmed with daughter  Diet : Heart healthy  Disposition Plan  :  Likely will require SNF on 05/21/2016  Consults  :  Call Dr. Era Bumpers urology over the phone  Procedures  :    CT head and C-spine. Nonacute  Renal ultrasound ordered  DVT Prophylaxis  :    Heparin   Lab Results  Component Value Date   PLT 144 (L) 05/20/2016    Inpatient Medications  Scheduled Meds: . antiseptic oral rinse  7 mL Mouth Rinse q12n4p  . aspirin  81 mg Oral Daily  . atorvastatin  5 mg Oral QPM  . chlorhexidine  15 mL Mouth Rinse BID  . Chlorhexidine Gluconate Cloth  6 each Topical Q0600  . feeding supplement (ENSURE ENLIVE)  237 mL Oral BID  . ferrous sulfate  325 mg Oral BID  . finasteride  5 mg Oral Daily  . heparin  5,000 Units Subcutaneous Q8H  . mupirocin ointment  1 application Nasal BID  . pantoprazole  40 mg Oral Daily  . polyvinyl alcohol  2 drop Both Eyes QID  . sodium chloride flush  3 mL Intravenous Q12H  . tamsulosin  0.4 mg Oral QPC supper   Continuous Infusions: . sodium chloride     PRN Meds:.acetaminophen **OR** acetaminophen, HYDROcodone-acetaminophen, morphine injection, ondansetron **OR** ondansetron (ZOFRAN) IV  Antibiotics  :    Anti-infectives    None         Objective:   Vitals:   05/19/16 1555 05/19/16 1700 05/19/16 2318 05/20/16 0547  BP: 118/60  130/61 (!) 128/55  Pulse: (!) 101  88 (!) 55  Resp: 15  20 20   Temp: 98.8 F (37.1 C)  97.7 F (36.5 C) 97.9 F (36.6 C)  TempSrc:   Oral Oral  SpO2: 99%  96% 96%  Weight:  87.2 kg (192 lb 4.8 oz)    Height:  5\' 8"  (1.727 m)      Wt Readings from Last 3 Encounters:  05/19/16 87.2 kg (192 lb  4.8 oz)  03/19/16 76.7 kg (169 lb)  03/20/15 87.1 kg (192 lb)     Intake/Output Summary (Last 24 hours) at 05/20/16 1037 Last data filed at 05/20/16 0900  Gross per 24 hour  Intake          1600.83 ml  Output             5800 ml  Net         -4199.17 ml     Physical Exam  Awake Alert,  No new F.N deficits, Normal affect [place].AT,PERRAL Supple Neck,No JVD, No cervical lymphadenopathy appriciated.  Symmetrical Chest wall movement, Good air movement bilaterally, CTAB RRR,No Gallops,Rubs or new Murmurs, No Parasternal Heave +ve B.Sounds, Abd Soft, No tenderness, No organomegaly appriciated, No rebound - guarding or rigidity. No Cyanosis, Clubbing or edema, No new Rash or bruise , Foley in palce      Data Review:    CBC  Recent Labs Lab 05/19/16 1036 05/19/16 1651 05/20/16 0518  WBC 15.9* 12.9* 9.2  HGB 13.1 11.7* 10.4*  HCT 38.2* 33.5* 31.7*  PLT 151 148* 144*  MCV 94.6 93.3 96.4  MCH 32.4 32.6 31.6  MCHC 34.3 34.9 32.8  RDW 13.6 13.9 14.1  LYMPHSABS 0.6*  --   --   MONOABS 1.8*  --   --   EOSABS 0.0  --   --   BASOSABS 0.0  --   --     Chemistries   Recent Labs Lab 05/19/16 1036 05/19/16 1651 05/20/16 0021 05/20/16  0518  NA 127*  --  138 139  K 6.6*  --  4.2 4.4  CL 95*  --  110 111  CO2 17*  --  21* 21*  GLUCOSE 105*  --  90 97  BUN 82*  --  54* 47*  CREATININE 8.01* 6.23* 3.72* 2.80*  CALCIUM 8.8*  --  8.6* 8.4*  MG  --   --   --  2.4  AST 19  --   --   --   ALT 17  --   --   --   ALKPHOS 60  --   --   --   BILITOT 0.8  --   --   --    ------------------------------------------------------------------------------------------------------------------ No results for input(s): CHOL, HDL, LDLCALC, TRIG, CHOLHDL, LDLDIRECT in the last 72 hours.  Lab Results  Component Value Date   HGBA1C 5.6 05/19/2016   ------------------------------------------------------------------------------------------------------------------  Recent Labs  05/19/16 1705   TSH 2.531   ------------------------------------------------------------------------------------------------------------------ No results for input(s): VITAMINB12, FOLATE, FERRITIN, TIBC, IRON, RETICCTPCT in the last 72 hours.  Coagulation profile  Recent Labs Lab 05/19/16 1651  INR 1.25    No results for input(s): DDIMER in the last 72 hours.  Cardiac Enzymes  Recent Labs Lab 05/20/16 0021 05/20/16 0518  TROPONINI 0.05* 0.04*   ------------------------------------------------------------------------------------------------------------------    Component Value Date/Time   BNP 363.2 (H) 05/19/2016 1037    Micro Results Recent Results (from the past 240 hour(s))  MRSA PCR Screening     Status: Abnormal   Collection Time: 05/19/16  4:02 PM  Result Value Ref Range Status   MRSA by PCR POSITIVE (A) NEGATIVE Final    Comment:        The GeneXpert MRSA Assay (FDA approved for NASAL specimens only), is one component of a comprehensive MRSA colonization surveillance program. It is not intended to diagnose MRSA infection nor to guide or monitor treatment for MRSA infections. RESULT CALLED TO, READ BACK BY AND VERIFIED WITH: Kandis Mannan RN 1901 05/19/16 A NAVARRO     Radiology Reports Dg Chest 2 View  Result Date: 05/19/2016 CLINICAL DATA:  82 year old male with dementia and unwitnessed fall EXAM: CHEST  2 VIEW COMPARISON:  Prior chest x-ray 07/20/2014 FINDINGS: Stable cardiomegaly. Patient is status post median sternotomy with evidence of prior multivessel CABG. Inspiratory volumes are low and there are bibasilar opacities likely reflecting atelectasis. Chronic bilateral calcified pleural plaques. Chronic right pleural effusion is similar compared to prior. No pneumothorax. Pulmonary vascular congestion appears accentuated by the low lung volumes. No acute osseous abnormality. IMPRESSION: 1. Low inspiratory volumes with bibasilar atelectasis and crowding of the pulmonary  vasculature. 2. Chronic bilateral calcified pleural plaques suggest prior asbestos exposure. 3. Chronic right-sided pleural effusion is similar compared to prior. Electronically Signed   By: Jacqulynn Cadet M.D.   On: 05/19/2016 10:36  Ct Head Wo Contrast  Result Date: 05/19/2016 EXAM: CT HEAD WITHOUT CONTRAST CT CERVICAL SPINE WITHOUT CONTRAST TECHNIQUE: Multidetector CT imaging of the head and cervical spine was performed following the standard protocol without intravenous contrast. Multiplanar CT image reconstructions of the cervical spine were also generated. COMPARISON:  04/12/2016. FINDINGS: CT HEAD FINDINGS Brain: Moderate low density in the periventricular white matter likely related to small vessel disease. Cerebellar and cerebral atrophy which is expected for age. No mass lesion, hemorrhage, hydrocephalus, acute infarct, intra-axial, or extra-axial fluid collection. Vascular: Bilateral vertebral and carotid artery atherosclerosis. Skull: No significant soft tissue swelling.  No skull fracture. Sinuses/Orbits:  Surgical changes about the globes. Clear paranasal sinuses and mastoid air cells. Other: None. CT CERVICAL SPINE FINDINGS Spinal visualization through the bottom of T2. Prevertebral soft tissues are within normal limits. Bilateral carotid atherosclerosis. No apical pneumothorax. Multilevel cervical spondylosis, resulting in areas of central canal and neural foraminal narrowing. Most significant at C4-5, C5-6, and C6-7. Skull base intact. Maintenance of vertebral body height. Straightening of expected cervical lordosis. Presumed degenerative fusion of C3 through C5. Near complete degenerative fusion at C6-7. Trace C7-T1 anterolisthesis is not significantly changed. Multilevel facet arthropathy is worse on the right. Facets are well-aligned. Coronal reformats demonstrate only degenerative change about the C1-2 articulation, primarily left-sided. IMPRESSION: 1. No acute intracranial abnormality.  Cerebral atrophy and small vessel ischemic change. 2. Advanced cervical spondylosis, without acute fracture or subluxation. Electronically Signed   By: Abigail Miyamoto M.D.   On: 05/19/2016 10:50  Ct Cervical Spine Wo Contrast  Result Date: 05/19/2016 EXAM: CT HEAD WITHOUT CONTRAST CT CERVICAL SPINE WITHOUT CONTRAST TECHNIQUE: Multidetector CT imaging of the head and cervical spine was performed following the standard protocol without intravenous contrast. Multiplanar CT image reconstructions of the cervical spine were also generated. COMPARISON:  04/12/2016. FINDINGS: CT HEAD FINDINGS Brain: Moderate low density in the periventricular white matter likely related to small vessel disease. Cerebellar and cerebral atrophy which is expected for age. No mass lesion, hemorrhage, hydrocephalus, acute infarct, intra-axial, or extra-axial fluid collection. Vascular: Bilateral vertebral and carotid artery atherosclerosis. Skull: No significant soft tissue swelling.  No skull fracture. Sinuses/Orbits: Surgical changes about the globes. Clear paranasal sinuses and mastoid air cells. Other: None. CT CERVICAL SPINE FINDINGS Spinal visualization through the bottom of T2. Prevertebral soft tissues are within normal limits. Bilateral carotid atherosclerosis. No apical pneumothorax. Multilevel cervical spondylosis, resulting in areas of central canal and neural foraminal narrowing. Most significant at C4-5, C5-6, and C6-7. Skull base intact. Maintenance of vertebral body height. Straightening of expected cervical lordosis. Presumed degenerative fusion of C3 through C5. Near complete degenerative fusion at C6-7. Trace C7-T1 anterolisthesis is not significantly changed. Multilevel facet arthropathy is worse on the right. Facets are well-aligned. Coronal reformats demonstrate only degenerative change about the C1-2 articulation, primarily left-sided. IMPRESSION: 1. No acute intracranial abnormality. Cerebral atrophy and small vessel  ischemic change. 2. Advanced cervical spondylosis, without acute fracture or subluxation. Electronically Signed   By: Abigail Miyamoto M.D.   On: 05/19/2016 10:50   Time Spent in minutes  30   Avarey Yaeger K M.D on 05/20/2016 at 10:37 AM  Between 7am to 7pm - Pager - [phone]  After 7pm go to [email or web address] - password [facility]  [facility] -  Office  [phone]

## 2016-05-20 NOTE — Progress Notes (Signed)
Less than 50 cc dark urine found in catheter drainage bag. Staff reports bag has not been emptied since shift change at 1900. Pt reports slight "belly hurt" and abdomen feels slightly firm. Bladder scan device requested from portable equipment- bladder scan to be done. Findings reported to oncoming nurse at 2300 shift change.  Blair Hailey, RN

## 2016-05-20 NOTE — Evaluation (Signed)
Physical Therapy Evaluation Patient Details Name: Ryan Pacheco MRN: [id number] DOB: 1920/06/19 Today's Date: 05/20/2016   History of Present Illness  82 y.o. male with medical history significant of BPH, remote history of non-STEMI and arthritis and admitted after fall at facility, found to have acute renal failure  Clinical Impression  Pt admitted with above diagnosis. Pt currently with functional limitations due to the deficits listed below (see PT Problem List).  Pt will benefit from skilled PT to increase their independence and safety with mobility to allow discharge to the venue listed below.   Pt reports "decreased energy" has kept him from being very mobile.  Pt from ALF and typically able to perform short distance ambulation with RW and ADLs modified independent however requiring some assist today and would benefit from ST-SNF if ALF unable to provide current level of assist.     Follow Up Recommendations SNF;Supervision/Assistance - 24 hour    Equipment Recommendations  None recommended by PT    Recommendations for Other Services       Precautions / Restrictions Precautions Precautions: Fall      Mobility  Bed Mobility Overal bed mobility: Needs Assistance Bed Mobility: Supine to Sit     Supine to sit: Min assist;HOB elevated     General bed mobility comments: assist for trunk upright  Transfers Overall transfer level: Needs assistance Equipment used: Rolling walker (2 wheeled) Transfers: Sit to/from [facility] Sit to Stand: Min assist Stand pivot transfers: Min assist       General transfer comment: verbal cues for hand placement, assist to rise and steady, shuffling side steps over to recliner  Ambulation/Gait                Stairs            Wheelchair Mobility    Modified Rankin (Stroke Patients Only)       Balance                                             Pertinent Vitals/Pain Pain  Assessment: No/denies pain    Home Living Family/patient expects to be discharged to:: Assisted living               Home Equipment: Walker - 2 wheels;Electric scooter      Prior Function Level of Independence: Independent with assistive device(s)         Comments: daughter states pt is able ambulate short distances with RW in his apt but takes power w/c to dining hall, able to perform his own ADLs     Hand Dominance        Extremity/Trunk Assessment               Lower Extremity Assessment: Generalized weakness         Communication   Communication: HOH  Cognition Arousal/Alertness: Awake/alert Behavior During Therapy: WFL for tasks assessed/performed Overall Cognitive Status: Within Functional Limits for tasks assessed                      General Comments      Exercises        Assessment/Plan    PT Assessment Patient needs continued PT services  PT Diagnosis Difficulty walking;Generalized weakness   PT Problem List Decreased strength;Decreased activity tolerance;Decreased knowledge of use of DME;Decreased mobility  PT Treatment  Interventions DME instruction;Gait training;Functional mobility training;Patient/family education;Therapeutic activities;Balance training;Therapeutic exercise   PT Goals (Current goals can be found in the Care Plan section) Acute Rehab PT Goals PT Goal Formulation: With patient/family Time For Goal Achievement: 05/27/16 Potential to Achieve Goals: Good    Frequency Min 3X/week   Barriers to discharge        Co-evaluation               End of Session Equipment Utilized During Treatment: Gait belt Activity Tolerance: Patient limited by fatigue Patient left: in chair;with call bell/phone within reach;with family/visitor present Nurse Communication:  (RN aware of possible IV leaking)         Time: [id number] PT Time Calculation (min) (ACUTE ONLY): 19 min   Charges:   PT Evaluation $PT Eval  Low Complexity: 1 Procedure     PT G Codes:        Sherhonda Gaspar,KATHrine E 05/20/2016, 1:27 PM Carmelia Bake, PT, DPT 05/20/2016 Pager: [phone]

## 2016-05-20 NOTE — Progress Notes (Signed)
NP Schorr notified of bladder scan findings. Orders received. Report given to oncoming RN.  Blair Hailey, RN

## 2016-05-20 NOTE — Progress Notes (Signed)
Cc:  Dr. Maury Dus  Assessment:  1. Elevated Cr, 2ndary AUR, resolving                          2. Fall at [place], with negative w/u. Fall from ? tamsulosin side effect. Pt had stopped his Lasix. Now restarted.                          3. Pt able to go back to assisted living b/c they can take care of foley, but he may need to be moved to nursing home status very soon.                          4. Advise stop tamsulosin b/c of potential side effect of dizziness. Agree with finasteride, 5mg /day.   Plan:  1. Discussed f/u with daughter in office when appropriate per Dr. Alyson Ingles ( when Cr returns to baseline.) He will be given a voiding trial. Poor surgical candidate. May need permanent foley.     Subjective:  Pt hx from his daughter, and review of pt chart: He is a 82 yo single male, who does not like taking his "fluid pills". He lives in [facility] [place] [facility] ( pt responsible for taking  his own medicines). He decided that he doesn't like taking his "fluid pills", because they make him "pee too much", and  His daughter believes that he astopped taking them last week ( unknown how many days he missed. He fell, however ( ?2ndary to tamsulosin); and was admitted for furter evaluation, with finding of Cr=8.0. Foley catheter insserted, and Cr now 2.8.    Hx of AUR following CABG, with s-p tube, which was eventually removed. He was last seen in the office in the fall of 2016, doing well.   Objective: Vital signs in last 24 hours: Temp:  [97.7 F (36.5 C)-99 F (37.2 C)] 97.9 F (36.6 C) (07/24 0547) Pulse Rate:  [49-112] 55 (07/24 0547) Resp:  [12-31] 20 (07/24 0547) BP: (114-162)/(49-77) 128/55 (07/24 0547) SpO2:  [96 %-100 %] 96 % (07/24 0547) Weight:  [87.2 kg (192 lb 4.8 oz)] 87.2 kg (192 lb 4.8 oz) (07/23 1700)A  Intake/Output from previous day: 07/23 0701 - 07/24 0700 In: 1455 [P.O.:180; I.V.:1275] Out: 5800 [Urine:5800] Intake/Output this  shift: Total I/O In: 158.3 [I.V.:158.3] Out: 500 [Urine:500] Home Medications                                Prior to Admission medications   Medication Sig Start Date End Date Taking? Authorizing Provider  acetaminophen (TYLENOL) 325 MG tablet Take 650 mg by mouth every 6 (six) hours as needed for mild pain.   Yes Historical Provider, MD  aspirin 81 MG tablet Take 81 mg by mouth daily.   Yes Historical Provider, MD  atorvastatin (LIPITOR) 10 MG tablet Take 5 mg by mouth daily.   Yes Historical Provider, MD  CALCIUM-VITAMIN D PO Take 600 mg by mouth daily.   Yes Historical Provider, MD  ENSURE (ENSURE) Take 1 Can by mouth 2 (two) times daily.   Yes Historical Provider, MD  ferrous sulfate 325 (65 FE) MG tablet Take 325 mg by mouth 2 (two) times daily.    Yes Historical Provider, MD  furosemide (LASIX) 40 MG tablet Take  40 mg by mouth daily after breakfast. May take extra dose of Lasix up to 2 times weekly for edema.   Yes Historical Provider, MD  methylcellulose (ARTIFICIAL TEARS) 1 % ophthalmic solution Place 2 drops into both eyes 4 (four) times daily.   Yes Historical Provider, MD  Multiple Vitamin (MULITIVITAMIN WITH MINERALS) TABS Take 1 tablet by mouth daily.   Yes Historical Provider, MD  pantoprazole (PROTONIX) 40 MG tablet Take 40 mg by mouth daily.   Yes Historical Provider, MD  potassium chloride SA (K-DUR,KLOR-CON) 20 MEQ tablet Take 2 tablets (40 mEq total) by mouth daily. Patient taking differently: Take 20 mEq by mouth 2 (two) times daily.  12/03/11 05/19/16 Yes [name], PA-C  tamsulosin (FLOMAX) 0.4 MG CAPS capsule TAKE 0.4 MG BY MOUTH DAILY 03/07/16  Yes Historical Provider, MD  atorvastatin (LIPITOR) 20 MG tablet TAKE 1/2 TABLET BY MOUTH ONCE DAILY Patient not taking: Reported on 05/19/2016 06/22/15   Jettie Booze, MD  Inpatient Medications  Scheduled Meds: . antiseptic oral rinse  7 mL Mouth Rinse q12n4p  . aspirin  81 mg Oral Daily  .  atorvastatin  5 mg Oral QPM  . chlorhexidine  15 mL Mouth Rinse BID  . Chlorhexidine Gluconate Cloth  6 each Topical Q0600  . feeding supplement (ENSURE ENLIVE)  237 mL Oral BID  . ferrous sulfate  325 mg Oral BID  . finasteride  5 mg Oral Daily  . heparin  5,000 Units Subcutaneous Q8H  . mupirocin ointment  1 application Nasal BID  . pantoprazole  40 mg Oral Daily  . polyvinyl alcohol  2 drop Both Eyes QID  . sodium chloride flush  3 mL Intravenous Q12H  . tamsulosin  0.4 mg Oral QPC supper     Past Medical History:  Diagnosis Date  . Acid reflux   . Angina   . Arthritis 01-09-12   hands, foot  . Benign prostatic hypertrophy   . Bradycardia   . Diabetes mellitus   . Edema   . Embolism - blood clot ~ 2000   "behind left knee"  . History of asbestos exposure    "have some lung disease from years of exposure"  . Myocardial infarction ([place]) 11/17/10    NSTEMI  . Prostate hypertrophy 01-09-12   urinary retention-Foley catheter at present; surgery planned    Physical Exam:  Lungs - Normal respiratory effort, chest expands symmetrically.  Abdomen - Soft, non-tender & non-distended.  Lab Results:  Recent Labs  05/19/16 1036 05/19/16 1651 05/20/16 0518  WBC 15.9* 12.9* 9.2  HGB 13.1 11.7* 10.4*  HCT 38.2* 33.5* 31.7*   BMET  Recent Labs  05/20/16 0021 05/20/16 0518  NA 138 139  K 4.2 4.4  CL 110 111  CO2 21* 21*  GLUCOSE 90 97  BUN 54* 47*  CREATININE 3.72* 2.80*  CALCIUM 8.6* 8.4*   No results for input(s): LABURIN in the last 72 hours. Results for orders placed or performed during the hospital encounter of 05/19/16  MRSA PCR Screening     Status: Abnormal   Collection Time: 05/19/16  4:02 PM  Result Value Ref Range Status   MRSA by PCR POSITIVE (A) NEGATIVE Final    Comment:        The GeneXpert MRSA Assay (FDA approved for NASAL specimens only), is one component of a comprehensive MRSA colonization surveillance program. It is not intended to  diagnose MRSA infection nor to guide or monitor treatment for MRSA  infections. RESULT CALLED TO, READ BACK BY AND VERIFIED WITH: K CHEEK RN 1901 05/19/16 A NAVARRO     Studies/Results: No results found.    Gerturde Kuba I Nicholous Girgenti 05/20/2016, 12:15 PM

## 2016-05-21 LAB — BASIC METABOLIC PANEL
Anion gap: 6 (ref 5–15)
BUN: 24 mg/dL — AB (ref 6–20)
CALCIUM: 8.7 mg/dL — AB (ref 8.9–10.3)
CO2: 23 mmol/L (ref 22–32)
CREATININE: 1.28 mg/dL — AB (ref 0.61–1.24)
Chloride: 114 mmol/L — ABNORMAL HIGH (ref 101–111)
GFR calc non Af Amer: 46 mL/min — ABNORMAL LOW (ref >60)
GFR, EST AFRICAN AMERICAN: 53 mL/min — AB (ref >60)
Glucose, Bld: 105 mg/dL — ABNORMAL HIGH (ref 65–99)
Potassium: 4.4 mmol/L (ref 3.5–5.1)
Sodium: 143 mmol/L (ref 135–145)

## 2016-05-21 NOTE — Progress Notes (Signed)
Physical Therapy Treatment Patient Details Name: Ryan Pacheco MRN: [id number] DOB: [birth year] Today's Date: 05/21/2016    History of Present Illness 82 y.o. male with medical history significant of BPH, remote history of non-STEMI and arthritis and admitted after fall at facility, found to have acute renal failure    PT Comments    On arrival pt on BSC having a BM.  Pt alert and oriented.  He knew to push call light when ready for assist.  Assisted pt with sit to stand and hygiene at [place] declined to attempt any amb despite several positive attempts.  Pt did turn safely 180 degrees to recliner with 25% VC's on proper hand placement and was able to maintain balance using B hands on arms of recliner and BSC.  Pt staed, "I'm 82 years old.  I've been through all that".  "No, I'm going to sit right here in my chair".  Assisted with positioning to comfort.   Pt is a resident of [facility].    Follow Up Recommendations  SNF;Supervision/Assistance - 24 hour     Equipment Recommendations       Recommendations for Other Services       Precautions / Restrictions Precautions Precautions: Fall Restrictions Weight Bearing Restrictions: No    Mobility  Bed Mobility               General bed mobility comments: OOB in recliner  Transfers Overall transfer level: Needs assistance Equipment used: None Transfers: Sit to/from Stand;Stand Pivot Transfers Sit to Stand: Min assist Stand pivot transfers: Min assist       General transfer comment: verbal cues for hand placement, assist to rise and steady, shuffling side steps over to recliner.  Assisted from [facility] to recliner  Ambulation/Gait             General Gait Details: pt declined to attempt amb.  Stated, "no I'm 82 years old.  I've been through all that"  Pt requested to rest.   Stairs            Wheelchair Mobility    Modified Rankin (Stroke Patients Only)       Balance                                     Cognition Arousal/Alertness: Awake/alert Behavior During Therapy: WFL for tasks assessed/performed Overall Cognitive Status: Within Functional Limits for tasks assessed                      Exercises      General Comments        Pertinent Vitals/Pain Pain Assessment: No/denies pain    Home Living                      Prior Function            PT Goals (current goals can now be found in the care plan section) Progress towards PT goals: Progressing toward goals    Frequency  Min 3X/week    PT Plan Current plan remains appropriate    Co-evaluation             End of Session   Activity Tolerance: Patient tolerated treatment well Patient left: in chair;with call bell/phone within reach;with chair alarm set     Time: [id number] PT Time Calculation (min) (ACUTE ONLY): 14 min  Charges:  $Therapeutic Activity: 8-22 mins                    G Codes:      Rica Koyanagi  PTA [facility]  Acute  Rehab Pager [phone]

## 2016-05-21 NOTE — Progress Notes (Signed)
PROGRESS NOTE                                                                                                                                                                                                             Patient Demographics:    Ryan Pacheco, is a 82 y.o. male, DOB - [birth year], [id number]  Admit date - 05/19/2016   Admitting Physician Verlee Monte, MD  Outpatient Primary MD for the patient is Ryan Austria, MD  LOS - 2  Chief Complaint  Patient presents with  . Fall       Brief Narrative    Ryan Pacheco is a 82 y.o. male with medical history significant of BPH, remote history of non-STEMI and arthritis. Patient lives in a nursing home and came to the hospital because he fell. He was found to have acute renal failure and hyperkalemia. Reportedly he was weaker and had an attended Chol, CT of the head/neck showed no evidence of trauma but findings showed severe ARF with creatinine of 8 and potassium of 6.6. Foley catheter placed and patient already diuresed 2 L of urine so far.  ED Course:  Vitals: Stable Labs: Sodium 127 potassium 6.6 BUN is 82 and creatinine 8.0. Imaging: CT head/neck without acute findings. CXR no acute findings. Interventions: Foley catheter placed with good diuresis of 2 L, given dextrose/insulin and sodium bicarbonate for the hyperkalemia    Subjective:    Sambhav Carmenate today has, No headache, No chest pain, No abdominal pain - No Nausea, No new weakness tingling or numbness, No Cough - SOB.     Assessment  & Plan :     1.ARF due to bladder outlet obstruction likely from BPH. Much improved after Foley catheter placement in the ER, good urine output, creatinine and potassium are much improved, post gentle hydration, continue finasterideOnly per urology, Flomax was discontinued by them, Urology input appreciated. Likely will require Foley catheter for several weeks if  not permanently. Function close to baseline.  2. BPH. As above.  3. Frequent falls with generalized deconditioning and weakness. Lives at an assisted living, walks minimally with a walker, PT eval. Will require SNF.  4. Dyslipidemia. On statin.  5. GERD. On PPI.   Family Communication  :  Daughter  Code Status :  DO NOT RESUSCITATE confirmed with daughter  Diet :  Heart healthy  Disposition Plan  :  Likely will require SNF on 05/22/2016  Consults  :  Dr. Era Bumpers urology   Procedures  :    CT head and C-spine. Nonacute  Renal ultrasound nonacute except 3 cm left upper pole renal cyst  DVT Prophylaxis  :    Heparin   Lab Results  Component Value Date   PLT 144 (L) 05/20/2016    Inpatient Medications  Scheduled Meds: . antiseptic oral rinse  7 mL Mouth Rinse q12n4p  . aspirin  81 mg Oral Daily  . atorvastatin  5 mg Oral QPM  . chlorhexidine  15 mL Mouth Rinse BID  . Chlorhexidine Gluconate Cloth  6 each Topical Q0600  . feeding supplement (ENSURE ENLIVE)  237 mL Oral BID  . ferrous sulfate  325 mg Oral BID  . finasteride  5 mg Oral Daily  . heparin  5,000 Units Subcutaneous Q8H  . mupirocin ointment  1 application Nasal BID  . pantoprazole  40 mg Oral Daily  . polyvinyl alcohol  2 drop Both Eyes QID  . sodium chloride flush  3 mL Intravenous Q12H   Continuous Infusions:   PRN Meds:.acetaminophen **OR** acetaminophen, HYDROcodone-acetaminophen, morphine injection, ondansetron **OR** ondansetron (ZOFRAN) IV  Antibiotics  :    Anti-infectives    None         Objective:   Vitals:   05/20/16 0547 05/20/16 1429 05/20/16 2223 05/21/16 0527  BP: (!) 128/55 (!) 128/56 (!) 152/67 (!) 150/59  Pulse: (!) 55 64 68 (!) 58  Resp: 20 20 18 18   Temp: 97.9 F (36.6 C) 97.5 F (36.4 C) 98 F (36.7 C) 97.8 F (36.6 C)  TempSrc: Oral Oral Oral Oral  SpO2: 96% 97% 95% 95%  Weight:      Height:        Wt Readings from Last 3 Encounters:  05/19/16 87.2 kg (192  lb 4.8 oz)  03/19/16 76.7 kg (169 lb)  03/20/15 87.1 kg (192 lb)     Intake/Output Summary (Last 24 hours) at 05/21/16 0957 Last data filed at 05/21/16 [id number]  Gross per 24 hour  Intake            672.5 ml  Output             2275 ml  Net          -1602.5 ml     Physical Exam  Awake Alert,  No new F.N deficits, Normal affect [place].AT,PERRAL Supple Neck,No JVD, No cervical lymphadenopathy appriciated.  Symmetrical Chest wall movement, Good air movement bilaterally, CTAB RRR,No Gallops,Rubs or new Murmurs, No Parasternal Heave +ve B.Sounds, Abd Soft, No tenderness, No organomegaly appriciated, No rebound - guarding or rigidity. No Cyanosis, Clubbing or edema, No new Rash or bruise , Foley in palce      Data Review:    CBC  Recent Labs Lab 05/19/16 1036 05/19/16 1651 05/20/16 0518  WBC 15.9* 12.9* 9.2  HGB 13.1 11.7* 10.4*  HCT 38.2* 33.5* 31.7*  PLT 151 148* 144*  MCV 94.6 93.3 96.4  MCH 32.4 32.6 31.6  MCHC 34.3 34.9 32.8  RDW 13.6 13.9 14.1  LYMPHSABS 0.6*  --   --   MONOABS 1.8*  --   --   EOSABS 0.0  --   --   BASOSABS 0.0  --   --     Chemistries   Recent Labs Lab 05/19/16 1036 05/19/16 1651 05/20/16 0021 05/20/16 0518 05/21/16 0515  NA  127*  --  138 139 143  K 6.6*  --  4.2 4.4 4.4  CL 95*  --  110 111 114*  CO2 17*  --  21* 21* 23  GLUCOSE 105*  --  90 97 105*  BUN 82*  --  54* 47* 24*  CREATININE 8.01* 6.23* 3.72* 2.80* 1.28*  CALCIUM 8.8*  --  8.6* 8.4* 8.7*  MG  --   --   --  2.4  --   AST 19  --   --   --   --   ALT 17  --   --   --   --   ALKPHOS 60  --   --   --   --   BILITOT 0.8  --   --   --   --    ------------------------------------------------------------------------------------------------------------------ No results for input(s): CHOL, HDL, LDLCALC, TRIG, CHOLHDL, LDLDIRECT in the last 72 hours.  Lab Results  Component Value Date   HGBA1C 5.6 05/19/2016    ------------------------------------------------------------------------------------------------------------------  Recent Labs  05/19/16 1705  TSH 2.531   ------------------------------------------------------------------------------------------------------------------ No results for input(s): VITAMINB12, FOLATE, FERRITIN, TIBC, IRON, RETICCTPCT in the last 72 hours.  Coagulation profile  Recent Labs Lab 05/19/16 1651  INR 1.25    No results for input(s): DDIMER in the last 72 hours.  Cardiac Enzymes  Recent Labs Lab 05/20/16 0021 05/20/16 0518 05/20/16 1211  TROPONINI 0.05* 0.04* 0.04*   ------------------------------------------------------------------------------------------------------------------    Component Value Date/Time   BNP 363.2 (H) 05/19/2016 1037    Micro Results Recent Results (from the past 240 hour(s))  MRSA PCR Screening     Status: Abnormal   Collection Time: 05/19/16  4:02 PM  Result Value Ref Range Status   MRSA by PCR POSITIVE (A) NEGATIVE Final    Comment:        The GeneXpert MRSA Assay (FDA approved for NASAL specimens only), is one component of a comprehensive MRSA colonization surveillance program. It is not intended to diagnose MRSA infection nor to guide or monitor treatment for MRSA infections. RESULT CALLED TO, READ BACK BY AND VERIFIED WITH: Kandis Mannan RN 1901 05/19/16 A NAVARRO     Radiology Reports Dg Chest 2 View  Result Date: 05/19/2016 CLINICAL DATA:  82 year old male with dementia and unwitnessed fall EXAM: CHEST  2 VIEW COMPARISON:  Prior chest x-ray 07/20/2014 FINDINGS: Stable cardiomegaly. Patient is status post median sternotomy with evidence of prior multivessel CABG. Inspiratory volumes are low and there are bibasilar opacities likely reflecting atelectasis. Chronic bilateral calcified pleural plaques. Chronic right pleural effusion is similar compared to prior. No pneumothorax. Pulmonary vascular congestion  appears accentuated by the low lung volumes. No acute osseous abnormality. IMPRESSION: 1. Low inspiratory volumes with bibasilar atelectasis and crowding of the pulmonary vasculature. 2. Chronic bilateral calcified pleural plaques suggest prior asbestos exposure. 3. Chronic right-sided pleural effusion is similar compared to prior. Electronically Signed   By: Jacqulynn Cadet M.D.   On: 05/19/2016 10:36  Ct Head Wo Contrast  Result Date: 05/19/2016 EXAM: CT HEAD WITHOUT CONTRAST CT CERVICAL SPINE WITHOUT CONTRAST TECHNIQUE: Multidetector CT imaging of the head and cervical spine was performed following the standard protocol without intravenous contrast. Multiplanar CT image reconstructions of the cervical spine were also generated. COMPARISON:  04/12/2016. FINDINGS: CT HEAD FINDINGS Brain: Moderate low density in the periventricular white matter likely related to small vessel disease. Cerebellar and cerebral atrophy which is expected for age. No mass lesion, hemorrhage, hydrocephalus, acute infarct,  intra-axial, or extra-axial fluid collection. Vascular: Bilateral vertebral and carotid artery atherosclerosis. Skull: No significant soft tissue swelling.  No skull fracture. Sinuses/Orbits: Surgical changes about the globes. Clear paranasal sinuses and mastoid air cells. Other: None. CT CERVICAL SPINE FINDINGS Spinal visualization through the bottom of T2. Prevertebral soft tissues are within normal limits. Bilateral carotid atherosclerosis. No apical pneumothorax. Multilevel cervical spondylosis, resulting in areas of central canal and neural foraminal narrowing. Most significant at C4-5, C5-6, and C6-7. Skull base intact. Maintenance of vertebral body height. Straightening of expected cervical lordosis. Presumed degenerative fusion of C3 through C5. Near complete degenerative fusion at C6-7. Trace C7-T1 anterolisthesis is not significantly changed. Multilevel facet arthropathy is worse on the right. Facets are  well-aligned. Coronal reformats demonstrate only degenerative change about the C1-2 articulation, primarily left-sided. IMPRESSION: 1. No acute intracranial abnormality. Cerebral atrophy and small vessel ischemic change. 2. Advanced cervical spondylosis, without acute fracture or subluxation. Electronically Signed   By: Abigail Miyamoto M.D.   On: 05/19/2016 10:50  Ct Cervical Spine Wo Contrast  Result Date: 05/19/2016 EXAM: CT HEAD WITHOUT CONTRAST CT CERVICAL SPINE WITHOUT CONTRAST TECHNIQUE: Multidetector CT imaging of the head and cervical spine was performed following the standard protocol without intravenous contrast. Multiplanar CT image reconstructions of the cervical spine were also generated. COMPARISON:  04/12/2016. FINDINGS: CT HEAD FINDINGS Brain: Moderate low density in the periventricular white matter likely related to small vessel disease. Cerebellar and cerebral atrophy which is expected for age. No mass lesion, hemorrhage, hydrocephalus, acute infarct, intra-axial, or extra-axial fluid collection. Vascular: Bilateral vertebral and carotid artery atherosclerosis. Skull: No significant soft tissue swelling.  No skull fracture. Sinuses/Orbits: Surgical changes about the globes. Clear paranasal sinuses and mastoid air cells. Other: None. CT CERVICAL SPINE FINDINGS Spinal visualization through the bottom of T2. Prevertebral soft tissues are within normal limits. Bilateral carotid atherosclerosis. No apical pneumothorax. Multilevel cervical spondylosis, resulting in areas of central canal and neural foraminal narrowing. Most significant at C4-5, C5-6, and C6-7. Skull base intact. Maintenance of vertebral body height. Straightening of expected cervical lordosis. Presumed degenerative fusion of C3 through C5. Near complete degenerative fusion at C6-7. Trace C7-T1 anterolisthesis is not significantly changed. Multilevel facet arthropathy is worse on the right. Facets are well-aligned. Coronal reformats  demonstrate only degenerative change about the C1-2 articulation, primarily left-sided. IMPRESSION: 1. No acute intracranial abnormality. Cerebral atrophy and small vessel ischemic change. 2. Advanced cervical spondylosis, without acute fracture or subluxation. Electronically Signed   By: Abigail Miyamoto M.D.   On: 05/19/2016 10:50  US Renal  Result Date: 05/20/2016 CLINICAL DATA:  82 year old diabetic male with acute renal failure. Initial encounter. EXAM: RENAL / URINARY TRACT ULTRASOUND COMPLETE COMPARISON:  None. FINDINGS: Right Kidney: Length: 11.6 cm. Renal parenchymal thinning. No hydronephrosis. There may be a tiny nonobstructing renal calculi. Left Kidney: Length: 11.5 cm. Renal parenchymal thinning. No hydronephrosis. Upper pole 3 cm cyst. Bladder: Appears normal for degree of bladder distention. IMPRESSION: Bilateral renal parenchymal thinning. No hydronephrosis. Question tiny nonobstructing right renal calculus. 3 cm left upper pole renal cyst. Electronically Signed   By: Genia Del M.D.   On: 05/20/2016 13:06   Time Spent in minutes  30   SINGH,PRASHANT K M.D on 05/21/2016 at 9:57 AM  Between 7am to 7pm - Pager - [phone]  After 7pm go to [email or web address] - password [facility]  [facility] -  Office  [phone]

## 2016-05-21 NOTE — Progress Notes (Signed)
About 400 ccs bloody urine returned upon foley irrigation. Post bladder scan reads 500 cc. MD contacted. 2nd irrigation ordered. Irrigated, no return urine. Bladder scan continues to read around 500cc. MD paged.

## 2016-05-22 DIAGNOSIS — N189 Chronic kidney disease, unspecified: Secondary | ICD-10-CM | POA: Diagnosis not present

## 2016-05-22 DIAGNOSIS — R531 Weakness: Secondary | ICD-10-CM | POA: Diagnosis not present

## 2016-05-22 DIAGNOSIS — N179 Acute kidney failure, unspecified: Secondary | ICD-10-CM | POA: Diagnosis not present

## 2016-05-22 DIAGNOSIS — I2510 Atherosclerotic heart disease of native coronary artery without angina pectoris: Secondary | ICD-10-CM | POA: Diagnosis not present

## 2016-05-22 DIAGNOSIS — R609 Edema, unspecified: Secondary | ICD-10-CM | POA: Diagnosis not present

## 2016-05-22 DIAGNOSIS — H21243 Degeneration of pupillary margin, bilateral: Secondary | ICD-10-CM | POA: Diagnosis not present

## 2016-05-22 DIAGNOSIS — Z79899 Other long term (current) drug therapy: Secondary | ICD-10-CM | POA: Diagnosis not present

## 2016-05-22 DIAGNOSIS — Z5189 Encounter for other specified aftercare: Secondary | ICD-10-CM | POA: Diagnosis not present

## 2016-05-22 DIAGNOSIS — E875 Hyperkalemia: Secondary | ICD-10-CM | POA: Diagnosis not present

## 2016-05-22 DIAGNOSIS — D509 Iron deficiency anemia, unspecified: Secondary | ICD-10-CM | POA: Diagnosis not present

## 2016-05-22 DIAGNOSIS — H6121 Impacted cerumen, right ear: Secondary | ICD-10-CM | POA: Diagnosis not present

## 2016-05-22 DIAGNOSIS — M6281 Muscle weakness (generalized): Secondary | ICD-10-CM | POA: Diagnosis not present

## 2016-05-22 DIAGNOSIS — I10 Essential (primary) hypertension: Secondary | ICD-10-CM | POA: Diagnosis not present

## 2016-05-22 DIAGNOSIS — J918 Pleural effusion in other conditions classified elsewhere: Secondary | ICD-10-CM | POA: Diagnosis not present

## 2016-05-22 DIAGNOSIS — K219 Gastro-esophageal reflux disease without esophagitis: Secondary | ICD-10-CM | POA: Diagnosis not present

## 2016-05-22 DIAGNOSIS — R296 Repeated falls: Secondary | ICD-10-CM | POA: Diagnosis not present

## 2016-05-22 DIAGNOSIS — R5381 Other malaise: Secondary | ICD-10-CM | POA: Diagnosis not present

## 2016-05-22 DIAGNOSIS — Z9181 History of falling: Secondary | ICD-10-CM | POA: Diagnosis not present

## 2016-05-22 DIAGNOSIS — R338 Other retention of urine: Secondary | ICD-10-CM | POA: Diagnosis not present

## 2016-05-22 DIAGNOSIS — E782 Mixed hyperlipidemia: Secondary | ICD-10-CM | POA: Diagnosis not present

## 2016-05-22 DIAGNOSIS — E876 Hypokalemia: Secondary | ICD-10-CM | POA: Diagnosis not present

## 2016-05-22 DIAGNOSIS — N401 Enlarged prostate with lower urinary tract symptoms: Secondary | ICD-10-CM | POA: Diagnosis not present

## 2016-05-22 DIAGNOSIS — K5901 Slow transit constipation: Secondary | ICD-10-CM | POA: Diagnosis not present

## 2016-05-22 DIAGNOSIS — N400 Enlarged prostate without lower urinary tract symptoms: Secondary | ICD-10-CM | POA: Diagnosis not present

## 2016-05-22 DIAGNOSIS — R938 Abnormal findings on diagnostic imaging of other specified body structures: Secondary | ICD-10-CM | POA: Diagnosis not present

## 2016-05-22 DIAGNOSIS — E785 Hyperlipidemia, unspecified: Secondary | ICD-10-CM | POA: Diagnosis not present

## 2016-05-22 DIAGNOSIS — Q6239 Other obstructive defects of renal pelvis and ureter: Secondary | ICD-10-CM | POA: Diagnosis not present

## 2016-05-22 DIAGNOSIS — E46 Unspecified protein-calorie malnutrition: Secondary | ICD-10-CM | POA: Diagnosis not present

## 2016-05-22 DIAGNOSIS — D500 Iron deficiency anemia secondary to blood loss (chronic): Secondary | ICD-10-CM | POA: Diagnosis not present

## 2016-05-22 DIAGNOSIS — N360 Urethral fistula: Secondary | ICD-10-CM | POA: Diagnosis not present

## 2016-05-22 DIAGNOSIS — R339 Retention of urine, unspecified: Secondary | ICD-10-CM | POA: Diagnosis not present

## 2016-05-22 DIAGNOSIS — N139 Obstructive and reflux uropathy, unspecified: Secondary | ICD-10-CM | POA: Diagnosis not present

## 2016-05-22 LAB — BASIC METABOLIC PANEL
ANION GAP: 4 — AB (ref 5–15)
BUN: 18 mg/dL (ref 6–20)
CHLORIDE: 109 mmol/L (ref 101–111)
CO2: 28 mmol/L (ref 22–32)
Calcium: 8.5 mg/dL — ABNORMAL LOW (ref 8.9–10.3)
Creatinine, Ser: 1.27 mg/dL — ABNORMAL HIGH (ref 0.61–1.24)
GFR calc non Af Amer: 47 mL/min — ABNORMAL LOW (ref >60)
GFR, EST AFRICAN AMERICAN: 54 mL/min — AB (ref >60)
Glucose, Bld: 102 mg/dL — ABNORMAL HIGH (ref 65–99)
POTASSIUM: 5.2 mmol/L — AB (ref 3.5–5.1)
SODIUM: 141 mmol/L (ref 135–145)

## 2016-05-22 MED ORDER — FUROSEMIDE 10 MG/ML IJ SOLN
40.0000 mg | Freq: Once | INTRAMUSCULAR | Status: AC
  Administered 2016-05-22: 40 mg via INTRAVENOUS
  Filled 2016-05-22: qty 4

## 2016-05-22 MED ORDER — SODIUM POLYSTYRENE SULFONATE 15 GM/60ML PO SUSP
30.0000 g | Freq: Once | ORAL | Status: AC
  Administered 2016-05-22: 30 g via ORAL
  Filled 2016-05-22: qty 120

## 2016-05-22 MED ORDER — FINASTERIDE 5 MG PO TABS: 5.0000 mg | ORAL_TABLET | Freq: Every day | ORAL | Status: DC

## 2016-05-22 MED ORDER — SODIUM CHLORIDE 0.9 % IV BOLUS (SEPSIS)
500.0000 mL | Freq: Once | INTRAVENOUS | Status: AC
Start: 2016-05-22 — End: 2016-05-22
  Administered 2016-05-22: 500 mL via INTRAVENOUS

## 2016-05-22 NOTE — Clinical Social Work Placement (Signed)
   CLINICAL SOCIAL WORK PLACEMENT  NOTE  Date:  05/22/2016  Patient Details  Name: Ryan Pacheco MRN: [id number] Date of Birth: 02/19/1920  Clinical Social Work is seeking post-discharge placement for this patient at the [place] level of care (*CSW will initial, date and re-position this form in  chart as items are completed):  Yes   Patient/family provided with [facility] Work Department's list of facilities offering this level of care within the geographic area requested by the patient (or if unable, by the patient's family).  Yes   Patient/family informed of their freedom to choose among providers that offer the needed level of care, that participate in Medicare, Medicaid or managed care program needed by the patient, have an available bed and are willing to accept the patient.  Yes   Patient/family informed of [facility]'s ownership interest in [facility] and [facility], as well as of the fact that they are under no obligation to receive care at these facilities.  PASRR submitted to EDS on    PASRR number received on       Existing PASRR number confirmed on    05/22/2916   FL2 transmitted to all facilities in geographic area requested by pt/family on       FL2 transmitted to all facilities within larger geographic area on       Patient informed that his/her managed care company has contracts with or will negotiate with certain facilities, including the following:  [employer]     Yes   Patient/family informed of bed offers received.  Patient chooses bed at [facility]     Physician recommends and patient chooses bed at      Patient to be transferred to [facility] on 05/22/16.  Patient to be transferred to facility by [facility]     Patient family notified on 05/22/16 of transfer.  Name of family member notified:  Daughter: [name] [phone]     PHYSICIAN       Additional Comment:     _______________________________________________ Lia Hopping, [place] 05/22/2016, 12:52 PM

## 2016-05-22 NOTE — NC FL2 (Signed)
[place] MEDICAID FL2 LEVEL OF CARE SCREENING TOOL     IDENTIFICATION  Patient Name: Ryan Pacheco Birthdate: 08-Jun-1920 Sex: male Admission Date (Current Location): 05/19/2016  [facility] and [place] Number:  Producer, television/film/video and Address:  [facility],  [address], [place] [address]      Provider Number: [id number]  Attending Physician Name and Address:  Leroy Sea, MD  Relative Name and Phone Number:       Current Level of Care: Hospital Recommended Level of Care: Skilled Nursing Facility Prior Approval Number:    Date Approved/Denied:   PASRR Number:    Discharge Plan: SNF    Current Diagnoses: Patient Active Problem List   Diagnosis Date Noted  . Acute renal failure (ARF) (HCC) 05/19/2016  . Bladder outlet obstruction 05/19/2016  . Hyperkalemia 05/19/2016  . Frequent falls 05/19/2016  . Mixed hyperlipidemia 11/10/2013  . Old myocardial infarction 11/10/2013  . Edema 11/10/2013  . Postsurgical aortocoronary bypass status 02/06/2012  . Coronary atherosclerosis 02/06/2012    Orientation RESPIRATION BLADDER Height & Weight     Self, Situation  Normal Indwelling catheter Weight: 192 lb 4.8 oz (87.2 kg) Height:  5\' 8"  (172.7 cm)  BEHAVIORAL SYMPTOMS/MOOD NEUROLOGICAL BOWEL NUTRITION STATUS      Continent Diet (Heart Healthy)  AMBULATORY STATUS COMMUNICATION OF NEEDS Skin   Limited Assist Verbally Normal                       Personal Care Assistance Level of Assistance  Bathing, Feeding, Dressing Bathing Assistance: Limited assistance Feeding assistance: Independent Dressing Assistance: Limited assistance     Functional Limitations Info  Sight, Speech, Hearing Sight Info: Impaired Hearing Info: Impaired Speech Info: Adequate    SPECIAL CARE FACTORS FREQUENCY  PT (By licensed PT)     PT Frequency: 5              Contractures Contractures Info: Not present    Additional Factors Info  Code Status, Allergies,  Isolation Precautions Code Status Info: DNR       Isolation Precautions Info: MRSA     Current Medications (05/22/2016):  This is the current hospital active medication list Current Facility-Administered Medications  Medication Dose Route Frequency Provider Last Rate Last Dose  . acetaminophen (TYLENOL) tablet 650 mg  650 mg Oral Q6H PRN Clydia Llano, MD       Or  . acetaminophen (TYLENOL) suppository 650 mg  650 mg Rectal Q6H PRN Clydia Llano, MD      . antiseptic oral rinse (CPC / CETYLPYRIDINIUM CHLORIDE 0.05%) solution 7 mL  7 mL Mouth Rinse q12n4p Clydia Llano, MD   7 mL at 05/21/16 1700  . aspirin chewable tablet 81 mg  81 mg Oral Daily Clydia Llano, MD   81 mg at 05/21/16 1008  . atorvastatin (LIPITOR) tablet 5 mg  5 mg Oral QPM Clydia Llano, MD   5 mg at 05/21/16 1700  . chlorhexidine (PERIDEX) 0.12 % solution 15 mL  15 mL Mouth Rinse BID Clydia Llano, MD   15 mL at 05/21/16 2140  . Chlorhexidine Gluconate Cloth 2 % PADS 6 each  6 each Topical Q0600 Clydia Llano, MD   6 each at 05/22/16 0428  . feeding supplement (ENSURE ENLIVE) (ENSURE ENLIVE) liquid 237 mL  237 mL Oral BID Clydia Llano, MD   237 mL at 05/21/16 2141  . ferrous sulfate tablet 325 mg  325 mg Oral BID Mutaz Elmahi,  MD   325 mg at 05/21/16 2140  . finasteride (PROSCAR) tablet 5 mg  5 mg Oral Daily Verlee Monte, MD   5 mg at 05/21/16 1008  . furosemide (LASIX) injection 40 mg  40 mg Intravenous Once Thurnell Lose, MD      . HYDROcodone-acetaminophen (NORCO/VICODIN) 5-325 MG per tablet 1-2 tablet  1-2 tablet Oral Q4H PRN Verlee Monte, MD      . morphine 2 MG/ML injection 1 mg  1 mg Intravenous Q4H PRN Verlee Monte, MD      . mupirocin ointment (BACTROBAN) 2 % 1 application  1 application Nasal BID Verlee Monte, MD   1 application at [id number]  . ondansetron (ZOFRAN) tablet 4 mg  4 mg Oral Q6H PRN Verlee Monte, MD       Or  . ondansetron (ZOFRAN) injection 4 mg  4 mg Intravenous Q6H PRN Verlee Monte, MD      .  pantoprazole (PROTONIX) EC tablet 40 mg  40 mg Oral Daily Verlee Monte, MD   40 mg at 05/21/16 1008  . polyvinyl alcohol (LIQUIFILM TEARS) 1.4 % ophthalmic solution 2 drop  2 drop Both Eyes QID Verlee Monte, MD   2 drop at 05/21/16 2141  . sodium chloride 0.9 % bolus 500 mL  500 mL Intravenous Once Thurnell Lose, MD      . sodium chloride flush (NS) 0.9 % injection 3 mL  3 mL Intravenous Q12H Verlee Monte, MD   3 mL at 05/21/16 2141  . sodium polystyrene (KAYEXALATE) 15 GM/60ML suspension 30 g  30 g Oral Once Thurnell Lose, MD         Discharge Medications: Please see discharge summary for a list of discharge medications.  Relevant Imaging Results:  Relevant Lab Results:   Additional Information [id number]  Lia Hopping, LCSW

## 2016-05-22 NOTE — Discharge Instructions (Signed)
SNF staff check BMP on 05/23/16  Follow with Primary MD READE,ROBERT Sheppard Coil, MD in 7 days   Get CBC, CMP, 2 view Chest X ray checked  by Primary MD or SNF MD in 5-7 days ( we routinely change or add medications that can affect your baseline labs and fluid status, therefore we recommend that you get the mentioned basic workup next visit with your PCP, your PCP may decide not to get them or add new tests based on their clinical decision)   Activity: As tolerated with Full fall precautions use walker/cane & assistance as needed   Disposition SNF   Diet:   Heart Healthy  with feeding assistance and aspiration precautions.  For Heart failure patients - Check your Weight same time everyday, if you gain over 2 pounds, or you develop in leg swelling, experience more shortness of breath or chest pain, call your Primary MD immediately. Follow Cardiac Low Salt Diet and 1.5 lit/day fluid restriction.   On your next visit with your primary care physician please Get Medicines reviewed and adjusted.   Please request your Prim.MD to go over all Hospital Tests and Procedure/Radiological results at the follow up, please get all Hospital records sent to your Prim MD by signing hospital release before you go home.   If you experience worsening of your admission symptoms, develop shortness of breath, life threatening emergency, suicidal or homicidal thoughts you must seek medical attention immediately by calling 911 or calling your MD immediately  if symptoms less severe.  You Must read complete instructions/literature along with all the possible adverse reactions/side effects for all the Medicines you take and that have been prescribed to you. Take any new Medicines after you have completely understood and accpet all the possible adverse reactions/side effects.   Do not drive, operate heavy machinery, perform activities at heights, swimming or participation in water activities or provide baby sitting  services if your were admitted for syncope or siezures until you have seen by Primary MD or a Neurologist and advised to do so again.  Do not drive when taking Pain medications.    Do not take more than prescribed Pain, Sleep and Anxiety Medications  Special Instructions: If you have smoked or chewed Tobacco  in the last 2 yrs please stop smoking, stop any regular Alcohol  and or any Recreational drug use.  Wear Seat belts while driving.   Please note  You were cared for by a hospitalist during your hospital stay. If you have any questions about your discharge medications or the care you received while you were in the hospital after you are discharged, you can call the unit and asked to speak with the hospitalist on call if the hospitalist that took care of you is not available. Once you are discharged, your primary care physician will handle any further medical issues. Please note that NO REFILLS for any discharge medications will be authorized once you are discharged, as it is imperative that you return to your primary care physician (or establish a relationship with a primary care physician if you do not have one) for your aftercare needs so that they can reassess your need for medications and monitor your lab values.

## 2016-05-22 NOTE — Clinical Social Work Note (Addendum)
Clinical Social Work Assessment  Patient Details  Name: Ryan Pacheco MRN: [id number] Date of Birth: 18-Aug-1920  Date of referral:  05/22/16               Reason for consult:                   Permission sought to share information with:  Facility Sport and exercise psychologist, Family Supports, Case Manager Permission granted to share information::     Name::        Agency::     Relationship::     Contact Information:  [name]  Housing/Transportation Living arrangements for the past 2 months:  [place] of Information:  Adult Children Patient Interpreter Needed:  None Criminal Activity/Legal Involvement Pertinent to Current Situation/Hospitalization:  No - Comment as needed Significant Relationships:  Adult Children Lives with:  Facility Resident Do you feel safe going back to the place where you live?  No Need for family participation in patient care:     Care giving concerns: Patient is from [facility] and came to the hospital because he fell. Patient reports decreased energy that has kept him from being mobile. Patient and family wants SNF rehab at this time to get stronger.  Social Worker assessment / plan:  LCSWA met with patient and daughter at bedside. Pt from ALF and typically able to perform short distance ambulation with walker and ADLs independently. Patient daughter reports he has become more confused and has difficulty ambulating even more due to the fall at the facility. Daughter reports he was at [facility] for similar reasons four years ago and would like him to go back.  Assist Family with disposition to SNF.  Employment status:  Retired Forensic scientist:  Medicare PT Recommendations:    Information / Referral to community resources:  [place]  Patient/Family's Response to care:  Agreeable  Patient/Family's Understanding of and Emotional Response to Diagnosis, Current Treatment, and  Prognosis:  Patient daughter "I want him to go to facility to get stronger, he fell at the facility and is not safe to be there. My first choice is [facility] because it it close to my home and it is a nice facility and I know my dad will do good there." Patient daughter is very involved in his care and would like to see him at a nice facility to continue treatment and be able to return to ALF.  Emotional Assessment Appearance:    Attitude/Demeanor/Rapport:   (Confused) Affect (typically observed):  Accepting (Confused) Orientation:  Oriented to Self, Oriented to Place Alcohol / Substance use:  Not Applicable Psych involvement (Current and /or in the community):  No (Comment)  Discharge Needs  Concerns to be addressed:  Care Coordination Readmission within the last 30 days:  No Current discharge risk:  None Barriers to Discharge:  No Barriers Identified   Ryan Hopping, LCSW 05/22/2016, 12:24 PM

## 2016-05-22 NOTE — Discharge Summary (Signed)
Ryan Pacheco [id number] DOB: 22-Jan-1920 DOA: 05/19/2016  PCP: [name], MD  Admit date: 05/19/2016  Discharge date: 05/22/2016  Admitted From: Home   Disposition:  Home   Recommendations for Outpatient Follow-up:   Follow up with PCP in 1-2 weeks  PCP Please obtain BMP/CBC, 2 view CXR in 1week,  (see Discharge instructions)   PCP Please follow up on the following pending results: None   Home Health: None   Equipment/Devices: None  Consultations: Urology Discharge Condition: Stable   CODE STATUS: DNR   Diet Recommendation: Heart healthy with feeding assistance and aspiration precautions   Chief Complaint  Patient presents with  . Fall     Brief history of present illness from the day of admission and additional interim summary    Ryan Pacheco is a 82 y.o. male with medical history significant of BPH, remote history of non-STEMI and arthritis. Patient lives in a nursing home and came to the hospital because he fell. He was found to have acute renal failure and hyperkalemia. Reportedly he was weaker and had an attended Chol, CT of the head/neck showed no evidence of trauma but findings showed severe ARF with creatinine of 8 and potassium of 6.6. Foley catheter placed and patient already diuresed 2 L of urine so far.  ED Course:  Vitals: Stable Labs: Sodium 127 potassium 6.6 BUN is 82 and creatinine 8.0. Imaging: CT head/neck without acute findings. CXR no acute findings. Interventions: Foley catheter placed with good diuresis of 2 L, given dextrose/insulin and sodium bicarbonate for the hyperkalemia  Hospital issues addressed    1. ARF due to bladder outlet obstruction likely from BPH. Much improved after Foley catheter placement in the ER, good urine output, creatinine and potassium  are much improved, post gentle hydration, continue finasteride only per urology, Flomax was discontinued by them, Urology input appreciated. He will be discharged with Foley catheter and finasteride, will follow with his primary urologist Dr. Gaynelle Arabian within a week, likely will get a voiding trial in the office, Foley could be removed if he clears his voiding trial, if fails voiding trial may require long-term catheterization, renal function is close to baseline.   2. BPH. As above.  3. Frequent falls with generalized deconditioning and weakness. Lives at an assisted living, walks minimally with a walker, PT eval done. Flomax stopped, Will require SNF.  4. Dyslipidemia. On statin.  5. GERD. On PPI.  6. Mild hyperkalemia. Given Kayexalate, Lasix and IV fluids. Request SNF staff to repeat BMP on 07/24/2016. Note his home dose Lasix and potassium supplementation will be started from tomorrow based on BMP results. Kindly review BMP results and Lasix and potassium dose tomorrow.   Discharge diagnosis     Principal Problem:   Acute renal failure [facility]) Active Problems:   Bladder outlet obstruction   Hyperkalemia   Frequent falls    Discharge instructions    Discharge Instructions    Discharge instructions    Complete by:  As directed   SNF staff check  BMP on 05/23/16  Follow with Primary MD READE,ROBERT Sheppard Coil, MD in 7 days   Get CBC, CMP, 2 view Chest X ray checked  by Primary MD or SNF MD in 5-7 days ( we routinely change or add medications that can affect your baseline labs and fluid status, therefore we recommend that you get the mentioned basic workup next visit with your PCP, your PCP may decide not to get them or add new tests based on their clinical decision)   Activity: As tolerated with Full fall precautions use walker/cane & assistance as needed   Disposition SNF   Diet:   Heart Healthy  with feeding assistance and aspiration precautions.  For Heart failure  patients - Check your Weight same time everyday, if you gain over 2 pounds, or you develop in leg swelling, experience more shortness of breath or chest pain, call your Primary MD immediately. Follow Cardiac Low Salt Diet and 1.5 lit/day fluid restriction.   On your next visit with your primary care physician please Get Medicines reviewed and adjusted.   Please request your Prim.MD to go over all Hospital Tests and Procedure/Radiological results at the follow up, please get all Hospital records sent to your Prim MD by signing hospital release before you go home.   If you experience worsening of your admission symptoms, develop shortness of breath, life threatening emergency, suicidal or homicidal thoughts you must seek medical attention immediately by calling 911 or calling your MD immediately  if symptoms less severe.  You Must read complete instructions/literature along with all the possible adverse reactions/side effects for all the Medicines you take and that have been prescribed to you. Take any new Medicines after you have completely understood and accpet all the possible adverse reactions/side effects.   Do not drive, operate heavy machinery, perform activities at heights, swimming or participation in water activities or provide baby sitting services if your were admitted for syncope or siezures until you have seen by Primary MD or a Neurologist and advised to do so again.  Do not drive when taking Pain medications.    Do not take more than prescribed Pain, Sleep and Anxiety Medications  Special Instructions: If you have smoked or chewed Tobacco  in the last 2 yrs please stop smoking, stop any regular Alcohol  and or any Recreational drug use.  Wear Seat belts while driving.   Please note  You were cared for by a hospitalist during your hospital stay. If you have any questions about your discharge medications or the care you received while you were in the hospital after you are  discharged, you can call the unit and asked to speak with the hospitalist on call if the hospitalist that took care of you is not available. Once you are discharged, your primary care physician will handle any further medical issues. Please note that NO REFILLS for any discharge medications will be authorized once you are discharged, as it is imperative that you return to your primary care physician (or establish a relationship with a primary care physician if you do not have one) for your aftercare needs so that they can reassess your need for medications and monitor your lab values.   Increase activity slowly    Complete by:  As directed      Discharge Medications     Medication List    STOP taking these medications   tamsulosin 0.4 MG Caps capsule Commonly known as:  FLOMAX     TAKE these medications  acetaminophen 325 MG tablet Commonly known as:  TYLENOL Take 650 mg by mouth every 6 (six) hours as needed for mild pain.   aspirin 81 MG tablet Take 81 mg by mouth daily.   atorvastatin 10 MG tablet Commonly known as:  LIPITOR Take 5 mg by mouth daily. What changed:  Another medication with the same name was removed. Continue taking this medication, and follow the directions you see here.   CALCIUM-VITAMIN D PO Take 600 mg by mouth daily.   ENSURE Take 1 Can by mouth 2 (two) times daily.   ferrous sulfate 325 (65 FE) MG tablet Take 325 mg by mouth 2 (two) times daily.   finasteride 5 MG tablet Commonly known as:  PROSCAR Take 1 tablet (5 mg total) by mouth daily.   furosemide 40 MG tablet Commonly known as:  LASIX Take 40 mg by mouth daily after breakfast. May take extra dose of Lasix up to 2 times weekly for edema.   methylcellulose 1 % ophthalmic solution Commonly known as:  ARTIFICIAL TEARS Place 2 drops into both eyes 4 (four) times daily.   multivitamin with minerals Tabs tablet Take 1 tablet by mouth daily.   pantoprazole 40 MG tablet Commonly known as:   PROTONIX Take 40 mg by mouth daily.   potassium chloride SA 20 MEQ tablet Commonly known as:  K-DUR,KLOR-CON Take 2 tablets (40 mEq total) by mouth daily. What changed:  how much to take  when to take this       No Known Allergies  Follow-up Information    [name], MD. Schedule an appointment as soon as possible for a visit in 1 week(s).   Specialty:  Family Medicine Contact information: [place] [address] [phone]        Ailene Rud, MD. Schedule an appointment as soon as possible for a visit in 1 week(s).   Specialty:  Urology Contact information: [place] [address] [phone]           Major procedures and Radiology Reports - PLEASE review detailed and final reports thoroughly  -        Dg Chest 2 View  Result Date: 05/19/2016 CLINICAL DATA:  82 year old male with dementia and unwitnessed fall EXAM: CHEST  2 VIEW COMPARISON:  Prior chest x-ray 07/20/2014 FINDINGS: Stable cardiomegaly. Patient is status post median sternotomy with evidence of prior multivessel CABG. Inspiratory volumes are low and there are bibasilar opacities likely reflecting atelectasis. Chronic bilateral calcified pleural plaques. Chronic right pleural effusion is similar compared to prior. No pneumothorax. Pulmonary vascular congestion appears accentuated by the low lung volumes. No acute osseous abnormality. IMPRESSION: 1. Low inspiratory volumes with bibasilar atelectasis and crowding of the pulmonary vasculature. 2. Chronic bilateral calcified pleural plaques suggest prior asbestos exposure. 3. Chronic right-sided pleural effusion is similar compared to prior. Electronically Signed   By: Jacqulynn Cadet M.D.   On: 05/19/2016 10:36  Ct Head Wo Contrast  Result Date: 05/19/2016 EXAM: CT HEAD WITHOUT CONTRAST CT CERVICAL SPINE WITHOUT CONTRAST TECHNIQUE: Multidetector CT imaging of the head and cervical spine was  performed following the standard protocol without intravenous contrast. Multiplanar CT image reconstructions of the cervical spine were also generated. COMPARISON:  04/12/2016. FINDINGS: CT HEAD FINDINGS Brain: Moderate low density in the periventricular white matter likely related to small vessel disease. Cerebellar and cerebral atrophy which is expected for age. No mass lesion, hemorrhage, hydrocephalus, acute infarct, intra-axial, or extra-axial  fluid collection. Vascular: Bilateral vertebral and carotid artery atherosclerosis. Skull: No significant soft tissue swelling.  No skull fracture. Sinuses/Orbits: Surgical changes about the globes. Clear paranasal sinuses and mastoid air cells. Other: None. CT CERVICAL SPINE FINDINGS Spinal visualization through the bottom of T2. Prevertebral soft tissues are within normal limits. Bilateral carotid atherosclerosis. No apical pneumothorax. Multilevel cervical spondylosis, resulting in areas of central canal and neural foraminal narrowing. Most significant at C4-5, C5-6, and C6-7. Skull base intact. Maintenance of vertebral body height. Straightening of expected cervical lordosis. Presumed degenerative fusion of C3 through C5. Near complete degenerative fusion at C6-7. Trace C7-T1 anterolisthesis is not significantly changed. Multilevel facet arthropathy is worse on the right. Facets are well-aligned. Coronal reformats demonstrate only degenerative change about the C1-2 articulation, primarily left-sided. IMPRESSION: 1. No acute intracranial abnormality. Cerebral atrophy and small vessel ischemic change. 2. Advanced cervical spondylosis, without acute fracture or subluxation. Electronically Signed   By: Abigail Miyamoto M.D.   On: 05/19/2016 10:50  Ct Cervical Spine Wo Contrast  Result Date: 05/19/2016 EXAM: CT HEAD WITHOUT CONTRAST CT CERVICAL SPINE WITHOUT CONTRAST TECHNIQUE: Multidetector CT imaging of the head and cervical spine was performed following the standard  protocol without intravenous contrast. Multiplanar CT image reconstructions of the cervical spine were also generated. COMPARISON:  04/12/2016. FINDINGS: CT HEAD FINDINGS Brain: Moderate low density in the periventricular white matter likely related to small vessel disease. Cerebellar and cerebral atrophy which is expected for age. No mass lesion, hemorrhage, hydrocephalus, acute infarct, intra-axial, or extra-axial fluid collection. Vascular: Bilateral vertebral and carotid artery atherosclerosis. Skull: No significant soft tissue swelling.  No skull fracture. Sinuses/Orbits: Surgical changes about the globes. Clear paranasal sinuses and mastoid air cells. Other: None. CT CERVICAL SPINE FINDINGS Spinal visualization through the bottom of T2. Prevertebral soft tissues are within normal limits. Bilateral carotid atherosclerosis. No apical pneumothorax. Multilevel cervical spondylosis, resulting in areas of central canal and neural foraminal narrowing. Most significant at C4-5, C5-6, and C6-7. Skull base intact. Maintenance of vertebral body height. Straightening of expected cervical lordosis. Presumed degenerative fusion of C3 through C5. Near complete degenerative fusion at C6-7. Trace C7-T1 anterolisthesis is not significantly changed. Multilevel facet arthropathy is worse on the right. Facets are well-aligned. Coronal reformats demonstrate only degenerative change about the C1-2 articulation, primarily left-sided. IMPRESSION: 1. No acute intracranial abnormality. Cerebral atrophy and small vessel ischemic change. 2. Advanced cervical spondylosis, without acute fracture or subluxation. Electronically Signed   By: Abigail Miyamoto M.D.   On: 05/19/2016 10:50  US Renal  Result Date: 05/20/2016 CLINICAL DATA:  82 year old diabetic male with acute renal failure. Initial encounter. EXAM: RENAL / URINARY TRACT ULTRASOUND COMPLETE COMPARISON:  None. FINDINGS: Right Kidney: Length: 11.6 cm. Renal parenchymal thinning. No  hydronephrosis. There may be a tiny nonobstructing renal calculi. Left Kidney: Length: 11.5 cm. Renal parenchymal thinning. No hydronephrosis. Upper pole 3 cm cyst. Bladder: Appears normal for degree of bladder distention. IMPRESSION: Bilateral renal parenchymal thinning. No hydronephrosis. Question tiny nonobstructing right renal calculus. 3 cm left upper pole renal cyst. Electronically Signed   By: Genia Del M.D.   On: 05/20/2016 13:06   Micro Results     Recent Results (from the past 240 hour(s))  MRSA PCR Screening     Status: Abnormal   Collection Time: 05/19/16  4:02 PM  Result Value Ref Range Status   MRSA by PCR POSITIVE (A) NEGATIVE Final    Comment:        The GeneXpert MRSA  Assay (FDA approved for NASAL specimens only), is one component of a comprehensive MRSA colonization surveillance program. It is not intended to diagnose MRSA infection nor to guide or monitor treatment for MRSA infections. RESULT CALLED TO, READ BACK BY AND VERIFIED WITH: K CHEEK RN 1901 05/19/16 A NAVARRO     Today   Subjective    Ryan Pacheco today has no headache,no chest abdominal pain,no new weakness tingling or numbness, feels much better .     Objective   Blood pressure (!) 158/60, pulse (!) 50, temperature 97.4 F (36.3 C), temperature source Oral, resp. rate 18, height 5\' 8"  (1.727 m), weight 87.2 kg (192 lb 4.8 oz), SpO2 99 %.   Intake/Output Summary (Last 24 hours) at 05/22/16 0917 Last data filed at 05/22/16 0416  Gross per 24 hour  Intake              730 ml  Output             1410 ml  Net             -680 ml    Exam Awake Alert, Oriented x 3, No new F.N deficits, Normal affect [place].AT,PERRAL Supple Neck,No JVD, No cervical lymphadenopathy appriciated.  Symmetrical Chest wall movement, Good air movement bilaterally, CTAB RRR,No Gallops,Rubs or new Murmurs, No Parasternal Heave +ve B.Sounds, Abd Soft, Non tender, No organomegaly appriciated, No rebound -guarding or  rigidity. No Cyanosis, Clubbing or edema, No new Rash or bruise, foley in place   Data Review   CBC w Diff: Lab Results  Component Value Date   WBC 9.2 05/20/2016   HGB 10.4 (L) 05/20/2016   HCT 31.7 (L) 05/20/2016   PLT 144 (L) 05/20/2016   LYMPHOPCT 4 05/19/2016   MONOPCT 12 05/19/2016   EOSPCT 0 05/19/2016   BASOPCT 0 05/19/2016    CMP: Lab Results  Component Value Date   NA 141 05/22/2016   K 5.2 (H) 05/22/2016   CL 109 05/22/2016   CO2 28 05/22/2016   BUN 18 05/22/2016   CREATININE 1.27 (H) 05/22/2016   PROT 7.2 05/19/2016   ALBUMIN 4.1 05/19/2016   BILITOT 0.8 05/19/2016   ALKPHOS 60 05/19/2016   AST 19 05/19/2016   ALT 17 05/19/2016  .   Total Time in preparing paper work, data evaluation and todays exam - 35 minutes  Thurnell Lose M.D on 05/22/2016 at 9:17 AM  [facility]   Office  [phone]

## 2016-05-22 NOTE — Progress Notes (Signed)
Pt waiting on [facility] to transport pt to [facility]. Report called to [facility], Therapist, sports. No questions or concerns at this time.  [name], RN

## 2016-05-23 LAB — BASIC METABOLIC PANEL
BUN: 19 mg/dL (ref 4–21)
Creatinine: 1 mg/dL (ref 0.6–1.3)
GLUCOSE: 94 mg/dL
Potassium: 3.6 mmol/L (ref 3.4–5.3)
SODIUM: 145 mmol/L (ref 137–147)

## 2016-05-24 ENCOUNTER — Encounter: Payer: Self-pay | Admitting: Internal Medicine

## 2016-05-24 ENCOUNTER — Non-Acute Institutional Stay (SKILLED_NURSING_FACILITY): Payer: Medicare Other | Admitting: Internal Medicine

## 2016-05-24 DIAGNOSIS — R609 Edema, unspecified: Secondary | ICD-10-CM

## 2016-05-24 DIAGNOSIS — D509 Iron deficiency anemia, unspecified: Secondary | ICD-10-CM

## 2016-05-24 DIAGNOSIS — N179 Acute kidney failure, unspecified: Secondary | ICD-10-CM

## 2016-05-24 DIAGNOSIS — N139 Obstructive and reflux uropathy, unspecified: Secondary | ICD-10-CM

## 2016-05-24 DIAGNOSIS — R5381 Other malaise: Secondary | ICD-10-CM | POA: Diagnosis not present

## 2016-05-24 DIAGNOSIS — E782 Mixed hyperlipidemia: Secondary | ICD-10-CM | POA: Diagnosis not present

## 2016-05-24 DIAGNOSIS — K219 Gastro-esophageal reflux disease without esophagitis: Secondary | ICD-10-CM

## 2016-05-24 DIAGNOSIS — M47812 Spondylosis without myelopathy or radiculopathy, cervical region: Secondary | ICD-10-CM

## 2016-05-24 DIAGNOSIS — N400 Enlarged prostate without lower urinary tract symptoms: Secondary | ICD-10-CM

## 2016-05-24 DIAGNOSIS — E875 Hyperkalemia: Secondary | ICD-10-CM | POA: Diagnosis not present

## 2016-05-24 DIAGNOSIS — R296 Repeated falls: Secondary | ICD-10-CM | POA: Diagnosis not present

## 2016-05-24 DIAGNOSIS — I2510 Atherosclerotic heart disease of native coronary artery without angina pectoris: Secondary | ICD-10-CM

## 2016-05-24 DIAGNOSIS — R9389 Abnormal findings on diagnostic imaging of other specified body structures: Secondary | ICD-10-CM

## 2016-05-24 DIAGNOSIS — E46 Unspecified protein-calorie malnutrition: Secondary | ICD-10-CM

## 2016-05-24 DIAGNOSIS — R938 Abnormal findings on diagnostic imaging of other specified body structures: Secondary | ICD-10-CM

## 2016-05-24 NOTE — Progress Notes (Signed)
LOCATION: [facility]  PCP: [name], MD   Code Status: DNR  Goals of care: Advanced Directive information Advanced Directives 05/24/2016  Does patient have an advance directive? Yes  Type of Advance Directive Out of facility DNR (pink MOST or yellow form)  Does patient want to make changes to advanced directive? No - Patient declined  Copy of advanced directive(s) in chart? Yes  Would patient like information on creating an advanced directive? -  Pre-existing out of facility DNR order (yellow form or pink MOST form) -       Extended Emergency Contact Information Primary Emergency Contact: Alley,Brenda Address: [address]          [place], [place] [address] [place] Phone: [phone] Relation: Daughter Secondary Emergency Contact: Alley,Thomas Address: [address]          [place], [place] [address] [place] of [place] Phone: [phone] Mobile Phone: [phone] Relation: Relative   No Known Allergies  Chief Complaint  Patient presents with  . New Admit To SNF    New Admission     HPI:  Patient is a 82 y.o. male seen today for short term rehabilitation post hospital admission from 05/19/16-05/22/16 post fall with acute renal failure and hyperkalemia. CT head was negative for acute intracranial abnormalities. He had bladder outlet obstruction and required foley catheter. He was seen by urology and his flomax was discontinued. He received insulin and sodium bicarbonate for his hyperkalemia and gentle hydration that improved his renal function. He has PMH of BPH, CAD, arthritis among others. He is seen in his room today.   Review of Systems:  Constitutional: Negative for fever, chills, diaphoresis.  HENT: Negative for headache, congestion, nasal discharge. He is hard of hearing.  Eyes: Negative for discharge.  Respiratory: Negative for cough, shortness of breath and wheezing.   Cardiovascular: Negative for chest  pain, palpitations, leg swelling.  Gastrointestinal: Negative for heartburn, nausea, vomiting, abdominal pain. Last bowel movement was 2 days ago.  Genitourinary: Negative for flank pain.  Musculoskeletal: Negative for fall in the facility.  Skin: Negative for itching, rash.  Neurological: Negative for dizziness. Psychiatric/Behavioral: Negative for depression   Past Medical History:  Diagnosis Date  . Acid reflux   . Angina   . Arthritis 01-09-12   hands, foot  . Benign prostatic hypertrophy   . Bradycardia   . Diabetes mellitus   . Edema   . Embolism - blood clot ~ 2000   "behind left knee"  . History of asbestos exposure    "have some lung disease from years of exposure"  . Myocardial infarction ([facility]) 11/17/10    NSTEMI  . Prostate hypertrophy 01-09-12   urinary retention-Foley catheter at present; surgery planned   Past Surgical History:  Procedure Laterality Date  . BLEPHAROPLASTY     left eye  . CARDIAC CATHETERIZATION  01-09-12   1'13  . CATARACT EXTRACTION, BILATERAL  ~ 2010  . CHOLECYSTECTOMY  1980's  . CORONARY ARTERY BYPASS GRAFT  11/26/2011   Procedure:x4- CORONARY ARTERY BYPASS GRAFTING (CABG);  Surgeon: Gaye Pollack, MD;  Location: [facility];  Service: Open Heart Surgery;  Laterality: N/A;  . LEFT HEART CATHETERIZATION WITH CORONARY ANGIOGRAM N/A 11/20/2011   Procedure: LEFT HEART CATHETERIZATION WITH CORONARY ANGIOGRAM;  Surgeon: Jettie Booze, MD;  Location[facility] C[facility] LAB;  Service: Cardiovascular;  Laterality: N/A;  possible PCI   Social History:   reports that he has quit smoking. His smoking  use included Cigars. He has quit using smokeless tobacco. His smokeless tobacco use included Chew. He reports that he does not drink alcohol or use drugs.  Family History  Problem Relation Age of Onset  . Heart attack Father   . Hypertension Neg Hx   . Stroke Neg Hx     Medications:   Medication List       Accurate as of 05/24/16  1:01 PM. Always use your most  recent med list.          acetaminophen 325 MG tablet Commonly known as:  TYLENOL Take 650 mg by mouth every 6 (six) hours as needed for mild pain.   aspirin 81 MG tablet Take 81 mg by mouth daily.   atorvastatin 10 MG tablet Commonly known as:  LIPITOR Take 5 mg by mouth daily.   CALCIUM-VITAMIN D PO Take 600 mg by mouth daily.   ferrous sulfate 325 (65 FE) MG tablet Take 325 mg by mouth 2 (two) times daily.   finasteride 5 MG tablet Commonly known as:  PROSCAR Take 1 tablet (5 mg total) by mouth daily.   furosemide 40 MG tablet Commonly known as:  LASIX Take 40 mg by mouth daily after breakfast. May take extra dose of Lasix up to 2 times weekly for edema.   methylcellulose 1 % ophthalmic solution Commonly known as:  ARTIFICIAL TEARS Place 2 drops into both eyes 4 (four) times daily.   multivitamin with minerals Tabs tablet Take 1 tablet by mouth daily.   pantoprazole 40 MG tablet Commonly known as:  PROTONIX Take 40 mg by mouth daily.   potassium chloride SA 20 MEQ tablet Commonly known as:  K-DUR,KLOR-CON Take 40 mEq by mouth daily.   UNABLE TO FIND Med Name: Med pass 120 mL 2 times daily       Immunizations: Immunization History  Administered Date(s) Administered  . PPD Test 05/22/2016     Physical Exam: Vitals:   05/24/16 1209  BP: (!) 144/86  Pulse: 60  Resp: 16  Temp: 97.1 F (36.2 C)  TempSrc: Oral  SpO2: 95%  Weight: 192 lb (87.1 kg)  Height: 5\' 8"  (1.727 m)   Body mass index is 29.19 kg/m.  General- elderly male, well built, frail, in no acute distress Head- normocephalic, atraumatic Nose- no nasal discharge Throat- moist mucus membrane, missing teeth Eyes- PERRLA, EOMI, no pallor, no icterus, ectropion present Neck- no cervical lymphadenopathy Cardiovascular- normal s1,s2, + murmur Respiratory- bilateral clear to auscultation, no wheeze, no rhonchi, no crackles, no use of accessory muscles Abdomen- bowel sounds present,  soft, non tender, has foley catheter Musculoskeletal- able to move all 4 extremities, generalized weakness Neurological- alert and oriented to person, place and time Skin- warm and dry, chronic skin changes to his legs Psychiatry- normal mood and affect    Labs reviewed: Basic Metabolic Panel:  Recent Labs  05/20/16 0518 05/21/16 0515 05/22/16 0536 05/23/16  NA 139 143 141 145  K 4.4 4.4 5.2* 3.6  CL 111 114* 109  --   CO2 21* 23 28  --   GLUCOSE 97 105* 102*  --   BUN 47* 24* 18 19  CREATININE 2.80* 1.28* 1.27* 1.0  CALCIUM 8.4* 8.7* 8.5*  --   MG 2.4  --   --   --    Liver Function Tests:  Recent Labs  05/19/16 1036  AST 19  ALT 17  ALKPHOS 60  BILITOT 0.8  PROT 7.2  ALBUMIN 4.1  No results for input(s): LIPASE, AMYLASE in the last 8760 hours. No results for input(s): AMMONIA in the last 8760 hours. CBC:  Recent Labs  04/12/16 1500 05/19/16 1036 05/19/16 1651 05/20/16 0518  WBC 8.9 15.9* 12.9* 9.2  NEUTROABS 6.6 13.5*  --   --   HGB 13.6 13.1 11.7* 10.4*  HCT 41.4 38.2* 33.5* 31.7*  MCV 96.1 94.6 93.3 96.4  PLT 171 151 148* 144*   Cardiac Enzymes:  Recent Labs  05/20/16 0021 05/20/16 0518 05/20/16 1211  TROPONINI 0.05* 0.04* 0.04*   BNP: Invalid input(s): POCBNP CBG:  Recent Labs  05/19/16 1421  GLUCAP 134*    Radiological Exams: Dg Chest 2 View  Result Date: 05/19/2016 CLINICAL DATA:  82 year old male with dementia and unwitnessed fall EXAM: CHEST  2 VIEW COMPARISON:  Prior chest x-ray 07/20/2014 FINDINGS: Stable cardiomegaly. Patient is status post median sternotomy with evidence of prior multivessel CABG. Inspiratory volumes are low and there are bibasilar opacities likely reflecting atelectasis. Chronic bilateral calcified pleural plaques. Chronic right pleural effusion is similar compared to prior. No pneumothorax. Pulmonary vascular congestion appears accentuated by the low lung volumes. No acute osseous abnormality. IMPRESSION: 1.  Low inspiratory volumes with bibasilar atelectasis and crowding of the pulmonary vasculature. 2. Chronic bilateral calcified pleural plaques suggest prior asbestos exposure. 3. Chronic right-sided pleural effusion is similar compared to prior. Electronically Signed   By: Jacqulynn Cadet M.D.   On: 05/19/2016 10:36  Ct Head Wo Contrast  Result Date: 05/19/2016 EXAM: CT HEAD WITHOUT CONTRAST CT CERVICAL SPINE WITHOUT CONTRAST TECHNIQUE: Multidetector CT imaging of the head and cervical spine was performed following the standard protocol without intravenous contrast. Multiplanar CT image reconstructions of the cervical spine were also generated. COMPARISON:  04/12/2016. FINDINGS: CT HEAD FINDINGS Brain: Moderate low density in the periventricular white matter likely related to small vessel disease. Cerebellar and cerebral atrophy which is expected for age. No mass lesion, hemorrhage, hydrocephalus, acute infarct, intra-axial, or extra-axial fluid collection. Vascular: Bilateral vertebral and carotid artery atherosclerosis. Skull: No significant soft tissue swelling.  No skull fracture. Sinuses/Orbits: Surgical changes about the globes. Clear paranasal sinuses and mastoid air cells. Other: None. CT CERVICAL SPINE FINDINGS Spinal visualization through the bottom of T2. Prevertebral soft tissues are within normal limits. Bilateral carotid atherosclerosis. No apical pneumothorax. Multilevel cervical spondylosis, resulting in areas of central canal and neural foraminal narrowing. Most significant at C4-5, C5-6, and C6-7. Skull base intact. Maintenance of vertebral body height. Straightening of expected cervical lordosis. Presumed degenerative fusion of C3 through C5. Near complete degenerative fusion at C6-7. Trace C7-T1 anterolisthesis is not significantly changed. Multilevel facet arthropathy is worse on the right. Facets are well-aligned. Coronal reformats demonstrate only degenerative change about the C1-2  articulation, primarily left-sided. IMPRESSION: 1. No acute intracranial abnormality. Cerebral atrophy and small vessel ischemic change. 2. Advanced cervical spondylosis, without acute fracture or subluxation. Electronically Signed   By: Abigail Miyamoto M.D.   On: 05/19/2016 10:50  Ct Cervical Spine Wo Contrast  Result Date: 05/19/2016 EXAM: CT HEAD WITHOUT CONTRAST CT CERVICAL SPINE WITHOUT CONTRAST TECHNIQUE: Multidetector CT imaging of the head and cervical spine was performed following the standard protocol without intravenous contrast. Multiplanar CT image reconstructions of the cervical spine were also generated. COMPARISON:  04/12/2016. FINDINGS: CT HEAD FINDINGS Brain: Moderate low density in the periventricular white matter likely related to small vessel disease. Cerebellar and cerebral atrophy which is expected for age. No mass lesion, hemorrhage, hydrocephalus, acute infarct, intra-axial, or  extra-axial fluid collection. Vascular: Bilateral vertebral and carotid artery atherosclerosis. Skull: No significant soft tissue swelling.  No skull fracture. Sinuses/Orbits: Surgical changes about the globes. Clear paranasal sinuses and mastoid air cells. Other: None. CT CERVICAL SPINE FINDINGS Spinal visualization through the bottom of T2. Prevertebral soft tissues are within normal limits. Bilateral carotid atherosclerosis. No apical pneumothorax. Multilevel cervical spondylosis, resulting in areas of central canal and neural foraminal narrowing. Most significant at C4-5, C5-6, and C6-7. Skull base intact. Maintenance of vertebral body height. Straightening of expected cervical lordosis. Presumed degenerative fusion of C3 through C5. Near complete degenerative fusion at C6-7. Trace C7-T1 anterolisthesis is not significantly changed. Multilevel facet arthropathy is worse on the right. Facets are well-aligned. Coronal reformats demonstrate only degenerative change about the C1-2 articulation, primarily left-sided.  IMPRESSION: 1. No acute intracranial abnormality. Cerebral atrophy and small vessel ischemic change. 2. Advanced cervical spondylosis, without acute fracture or subluxation. Electronically Signed   By: Abigail Miyamoto M.D.   On: 05/19/2016 10:50  US Renal  Result Date: 05/20/2016 CLINICAL DATA:  82 year old diabetic male with acute renal failure. Initial encounter. EXAM: RENAL / URINARY TRACT ULTRASOUND COMPLETE COMPARISON:  None. FINDINGS: Right Kidney: Length: 11.6 cm. Renal parenchymal thinning. No hydronephrosis. There may be a tiny nonobstructing renal calculi. Left Kidney: Length: 11.5 cm. Renal parenchymal thinning. No hydronephrosis. Upper pole 3 cm cyst. Bladder: Appears normal for degree of bladder distention. IMPRESSION: Bilateral renal parenchymal thinning. No hydronephrosis. Question tiny nonobstructing right renal calculus. 3 cm left upper pole renal cyst. Electronically Signed   By: Genia Del M.D.   On: 05/20/2016 13:06   Assessment/Plan  Physical deconditioning Will have him work with physical therapy and occupational therapy team to help with gait training and muscle strengthening exercises.fall precautions. Skin care. Encourage to be out of bed.   Urinary outflow obstruction Has foley catheter in place. Continue proscar, get urology consult for voiding trial  Acute renal failure S/p iv fluids and has foley to help with urine outflow. Improved renal function on lab review. Monitor bmp  Frequent falls Will have patient work with PT/OT as tolerated to regain strength and restore function.  Fall precautions are in place.  Hyperkalemia On kcl supplement. Improved bmp on review. monitor bmp  Leg edema Stable at present. Continue lasix 40 mg daily with kcl 40 meq daily, check bmp  Iron def anemia Continue feso4 325 mg bid and check cbc  Protein calorie malnutrition Continue his medpass supplement and get RD consult. Monitor weekly weight  CAD Chest pain free. Continue  aspirin and statin  BPH Continue proscar for now  Hyperlipidemia Continue lipitor 5 mg daily  gerd Stable, continue protonix 40 mg daily  Cervical spondylosis Continue tylenol 650 mg q6h prn pain. To work with therapy team  Abnormal chest xray finding Monitor clinically. Repeat CXR in 1 week. Encourage to use incentive spirometer    Goals of care: short term rehabilitation   Labs/tests ordered: cbc, cmp 05/30/16  Family/ staff Communication: reviewed care plan with patient and nursing supervisor    Blanchie Serve, MD Internal Medicine [place], [place] [address] Cell Phone (Monday-Friday 8 am - 5 pm): [phone] On Call: [phone] and follow prompts after 5 pm and on weekends Office Phone: [phone] Office Fax: [phone]

## 2016-05-27 LAB — CBC AND DIFFERENTIAL
HEMATOCRIT: 41 % (ref 41–53)
Hemoglobin: 13.3 g/dL — AB (ref 13.5–17.5)
PLATELETS: 238 10*3/uL (ref 150–399)
WBC: 9.6 10*3/mL

## 2016-05-27 LAB — BASIC METABOLIC PANEL
BUN: 22 mg/dL — AB (ref 4–21)
CREATININE: 1 mg/dL (ref 0.6–1.3)
Glucose: 91 mg/dL
Potassium: 4.3 mmol/L (ref 3.4–5.3)
Sodium: 147 mmol/L (ref 137–147)

## 2016-05-27 LAB — HEPATIC FUNCTION PANEL
ALK PHOS: 64 U/L (ref 25–125)
ALT: 46 U/L — AB (ref 10–40)
AST: 24 U/L (ref 14–40)
BILIRUBIN, TOTAL: 0.5 mg/dL

## 2016-05-30 LAB — BASIC METABOLIC PANEL
BUN: 20 mg/dL (ref 4–21)
Creatinine: 0.9 mg/dL (ref 0.6–1.3)
GLUCOSE: 89 mg/dL
POTASSIUM: 4.4 mmol/L (ref 3.4–5.3)
SODIUM: 143 mmol/L (ref 137–147)

## 2016-06-04 DIAGNOSIS — R339 Retention of urine, unspecified: Secondary | ICD-10-CM | POA: Diagnosis not present

## 2016-06-05 DIAGNOSIS — R339 Retention of urine, unspecified: Secondary | ICD-10-CM | POA: Diagnosis not present

## 2016-06-12 DIAGNOSIS — R338 Other retention of urine: Secondary | ICD-10-CM | POA: Diagnosis not present

## 2016-06-12 DIAGNOSIS — N360 Urethral fistula: Secondary | ICD-10-CM | POA: Diagnosis not present

## 2016-06-12 DIAGNOSIS — H6121 Impacted cerumen, right ear: Secondary | ICD-10-CM | POA: Diagnosis not present

## 2016-06-12 DIAGNOSIS — N401 Enlarged prostate with lower urinary tract symptoms: Secondary | ICD-10-CM | POA: Diagnosis not present

## 2016-06-17 ENCOUNTER — Other Ambulatory Visit: Payer: Self-pay | Admitting: Urology

## 2016-06-17 ENCOUNTER — Non-Acute Institutional Stay (SKILLED_NURSING_FACILITY): Payer: Medicare Other | Admitting: Adult Health

## 2016-06-17 ENCOUNTER — Encounter: Payer: Self-pay | Admitting: Adult Health

## 2016-06-17 DIAGNOSIS — E46 Unspecified protein-calorie malnutrition: Secondary | ICD-10-CM

## 2016-06-17 DIAGNOSIS — D509 Iron deficiency anemia, unspecified: Secondary | ICD-10-CM | POA: Diagnosis not present

## 2016-06-17 DIAGNOSIS — R5381 Other malaise: Secondary | ICD-10-CM

## 2016-06-17 DIAGNOSIS — H21243 Degeneration of pupillary margin, bilateral: Secondary | ICD-10-CM

## 2016-06-17 DIAGNOSIS — E876 Hypokalemia: Secondary | ICD-10-CM

## 2016-06-17 DIAGNOSIS — K219 Gastro-esophageal reflux disease without esophagitis: Secondary | ICD-10-CM

## 2016-06-17 DIAGNOSIS — R339 Retention of urine, unspecified: Secondary | ICD-10-CM | POA: Diagnosis not present

## 2016-06-17 DIAGNOSIS — E785 Hyperlipidemia, unspecified: Secondary | ICD-10-CM

## 2016-06-17 DIAGNOSIS — R609 Edema, unspecified: Secondary | ICD-10-CM

## 2016-06-17 DIAGNOSIS — H2189 Other specified disorders of iris and ciliary body: Secondary | ICD-10-CM

## 2016-06-17 DIAGNOSIS — N400 Enlarged prostate without lower urinary tract symptoms: Secondary | ICD-10-CM | POA: Diagnosis not present

## 2016-06-17 DIAGNOSIS — R938 Abnormal findings on diagnostic imaging of other specified body structures: Secondary | ICD-10-CM | POA: Diagnosis not present

## 2016-06-17 DIAGNOSIS — I2510 Atherosclerotic heart disease of native coronary artery without angina pectoris: Secondary | ICD-10-CM | POA: Diagnosis not present

## 2016-06-17 DIAGNOSIS — R9389 Abnormal findings on diagnostic imaging of other specified body structures: Secondary | ICD-10-CM

## 2016-06-17 NOTE — Progress Notes (Signed)
Patient ID: Ryan Pacheco, male   DOB: April 10, 1920, 82 y.o.   MRN: [id number]    DATE:  06/17/2016   MRN:  [id number]  BIRTHDAY: 1920-03-09  Facility:  Nursing Home Location:  [place] Room Number: 308-P  LEVEL OF CARE:  SNF [phone])  Contact Information    Name [place] Daughter [phone]     Alley,Thomas Relative [phone]  [phone]       Code Status History    Date Active Date Inactive Code Status Order ID Comments User Context   05/20/2016 10:41 AM 05/22/2016  6:47 PM DNR [id number]  Thurnell Lose, MD Inpatient   05/19/2016  4:33 PM 05/20/2016 10:40 AM Full Code [id number]  Verlee Monte, MD Inpatient   11/26/2011 12:27 PM 11/29/2011  9:31 AM Full Code [id number]  Carolan Clines, RN Inpatient    Questions for Most Recent Historical Code Status (Order [id number])    Question Answer Comment   In the event of cardiac or respiratory ARREST Do not call a "code blue"    In the event of cardiac or respiratory ARREST Do not perform Intubation, CPR, defibrillation or ACLS    In the event of cardiac or respiratory ARREST Use medication by any route, position, wound care, and other measures to relive pain and suffering. May use oxygen, suction and manual treatment of airway obstruction as needed for comfort.         Advance Directive Documentation   Flowsheet Row Most Recent Value  Type of Advance Directive  Out of facility DNR (pink MOST or yellow form)  Pre-existing out of facility DNR order (yellow form or pink MOST form)  No data  "MOST" Form in Place?  No data       Chief Complaint  Patient presents with  . Medical Management of Chronic Issues    HISTORY OF PRESENT ILLNESS:  This is a 82 year old male who is being seen for a routine visit. He has been admitted to [facility] on 05/22/16 from [facility] S/P fall with acute renal failure and hyperkalemia. CT head was negative for acute  intracranial abnormalities. He had bladder outlet obstruction and required foley catheter. Urology was consulted and flmax was discontinued. He was given insulin and sodium bicarbonate for his hyperkalemia and gentle hydration and his renal function has improved.  He has been admitted for a short-term rehabilitation.  PAST MEDICAL HISTORY:  Past Medical History:  Diagnosis Date  . Acid reflux   . Angina   . Arthritis 01-09-12   hands, foot  . Benign prostatic hypertrophy   . Bradycardia   . Diabetes mellitus   . Edema   . Embolism - blood clot ~ 2000   "behind left knee"  . History of asbestos exposure    "have some lung disease from years of exposure"  . Myocardial infarction ([place] ) 11/17/10    NSTEMI  . Prostate hypertrophy 01-09-12   urinary retention-Foley catheter at present; surgery planned     CURRENT MEDICATIONS: Reviewed  Patient's Medications  New Prescriptions   No medications on file  Previous Medications   ACETAMINOPHEN (TYLENOL) 325 MG TABLET    Take 650 mg by mouth every 6 (six) hours as needed for mild pain.   ASPIRIN 81 MG TABLET    Take 81 mg by mouth daily.   ATORVASTATIN (LIPITOR) 10 MG TABLET    Take 5 mg by mouth daily.  CALCIUM-VITAMIN D PO    Take 600 mg by mouth daily.   FERROUS SULFATE 325 (65 FE) MG TABLET    Take 325 mg by mouth 2 (two) times daily.    FINASTERIDE (PROSCAR) 5 MG TABLET    Take 1 tablet (5 mg total) by mouth daily.   FUROSEMIDE (LASIX) 40 MG TABLET    Take 40 mg by mouth daily after breakfast. May take extra dose of Lasix up to 2 times weekly for edema.   METHYLCELLULOSE (ARTIFICIAL TEARS) 1 % OPHTHALMIC SOLUTION    Place 2 drops into both eyes 4 (four) times daily.   MULTIPLE VITAMIN (MULITIVITAMIN WITH MINERALS) TABS    Take 1 tablet by mouth daily.   PANTOPRAZOLE (PROTONIX) 40 MG TABLET    Take 40 mg by mouth daily.   POTASSIUM CHLORIDE SA (K-DUR,KLOR-CON) 20 MEQ TABLET    Take 40 mEq by mouth daily.   UNABLE TO FIND    Med Name:  Med pass 120 mL 2 times daily  Modified Medications   No medications on file  Discontinued Medications   No medications on file     No Known Allergies   REVIEW OF SYSTEMS:  GENERAL: no change in appetite, no fatigue, no weight changes, no fever, chills or weakness EYES: Denies change in vision, dry eyes, eye pain, itching or discharge EARS: Denies change in hearing, ringing in ears, or earache NOSE: Denies nasal congestion or epistaxis MOUTH and THROAT: Denies oral discomfort, gingival pain or bleeding, pain from teeth or hoarseness   RESPIRATORY: no cough, SOB, DOE, wheezing, hemoptysis CARDIAC: no chest pain, edema or palpitations GI: no abdominal pain, diarrhea, constipation, heart burn, nausea or vomiting GU: Denies dysuria, frequency, hematuria, incontinence, or discharge PSYCHIATRIC: Denies feeling of depression or anxiety. No report of hallucinations, insomnia, paranoia, or agitation     PHYSICAL EXAMINATION  GENERAL APPEARANCE: Well nourished. In no acute distress. Normal body habitus SKIN:  Skin is warm and dry.  HEAD: Normal in size and contour. No evidence of trauma EYES: Bilateral ectropion noted. No blepharitis, entropion or ectropion. PERRL. Conjunctivae are clear and sclerae are white. Lenses are without opacity EARS: Pinnae are normal. Patient hears normal voice tunes of the examiner MOUTH and THROAT: Lips are without lesions. Oral mucosa is moist and without lesions. Tongue is normal in shape, size, and color and without lesions NECK: supple, trachea midline, no neck masses, no thyroid tenderness, no thyromegaly LYMPHATICS: no LAN in the neck, no supraclavicular LAN RESPIRATORY: breathing is even & unlabored, BS CTAB CARDIAC: RRR, no murmur,no extra heart sounds, no edema GI: abdomen soft, normal BS, no masses, no tenderness, no hepatomegaly, no splenomegaly GU:  Has foley catheter attached to urine leg bag; has clear yellowish urine EXTREMITIES:  Able to  move X 4 extremities PSYCHIATRIC: Alert to place and person, disoriented to time. Affect and behavior are appropriate  LABS/RADIOLOGY: Labs reviewed: Basic Metabolic Panel:  Recent Labs  05/20/16 0518 05/21/16 0515 05/22/16 0536 05/23/16 05/27/16 1311 05/30/16 1351  NA 139 143 141 145 147 143  K 4.4 4.4 5.2* 3.6 4.3 4.4  CL 111 114* 109  --   --   --   CO2 21* 23 28  --   --   --   GLUCOSE 97 105* 102*  --   --   --   BUN 47* 24* 18 19 22* 20  CREATININE 2.80* 1.28* 1.27* 1.0 1.0 0.9  CALCIUM 8.4* 8.7* 8.5*  --   --   --  MG 2.4  --   --   --   --   --    Liver Function Tests:  Recent Labs  05/19/16 1036 05/27/16 1311  AST 19 24  ALT 17 46*  ALKPHOS 60 64  BILITOT 0.8  --   PROT 7.2  --   ALBUMIN 4.1  --     CBC:  Recent Labs  04/12/16 1500 05/19/16 1036 05/19/16 1651 05/20/16 0518 05/27/16 1311  WBC 8.9 15.9* 12.9* 9.2 9.6  NEUTROABS 6.6 13.5*  --   --   --   HGB 13.6 13.1 11.7* 10.4* 13.3*  HCT 41.4 38.2* 33.5* 31.7* 41  MCV 96.1 94.6 93.3 96.4  --   PLT 171 151 148* 144* 238   Cardiac Enzymes:  Recent Labs  05/20/16 0021 05/20/16 0518 05/20/16 1211  TROPONINI 0.05* 0.04* 0.04*   CBG:  Recent Labs  05/19/16 1421  GLUCAP 134*      Dg Chest 2 View  Result Date: 05/19/2016 CLINICAL DATA:  82 year old male with dementia and unwitnessed fall EXAM: CHEST  2 VIEW COMPARISON:  Prior chest x-ray 07/20/2014 FINDINGS: Stable cardiomegaly. Patient is status post median sternotomy with evidence of prior multivessel CABG. Inspiratory volumes are low and there are bibasilar opacities likely reflecting atelectasis. Chronic bilateral calcified pleural plaques. Chronic right pleural effusion is similar compared to prior. No pneumothorax. Pulmonary vascular congestion appears accentuated by the low lung volumes. No acute osseous abnormality. IMPRESSION: 1. Low inspiratory volumes with bibasilar atelectasis and crowding of the pulmonary vasculature. 2.  Chronic bilateral calcified pleural plaques suggest prior asbestos exposure. 3. Chronic right-sided pleural effusion is similar compared to prior. Electronically Signed   By: Jacqulynn Cadet M.D.   On: 05/19/2016 10:36  Ct Head Wo Contrast  Result Date: 05/19/2016 EXAM: CT HEAD WITHOUT CONTRAST CT CERVICAL SPINE WITHOUT CONTRAST TECHNIQUE: Multidetector CT imaging of the head and cervical spine was performed following the standard protocol without intravenous contrast. Multiplanar CT image reconstructions of the cervical spine were also generated. COMPARISON:  04/12/2016. FINDINGS: CT HEAD FINDINGS Brain: Moderate low density in the periventricular white matter likely related to small vessel disease. Cerebellar and cerebral atrophy which is expected for age. No mass lesion, hemorrhage, hydrocephalus, acute infarct, intra-axial, or extra-axial fluid collection. Vascular: Bilateral vertebral and carotid artery atherosclerosis. Skull: No significant soft tissue swelling.  No skull fracture. Sinuses/Orbits: Surgical changes about the globes. Clear paranasal sinuses and mastoid air cells. Other: None. CT CERVICAL SPINE FINDINGS Spinal visualization through the bottom of T2. Prevertebral soft tissues are within normal limits. Bilateral carotid atherosclerosis. No apical pneumothorax. Multilevel cervical spondylosis, resulting in areas of central canal and neural foraminal narrowing. Most significant at C4-5, C5-6, and C6-7. Skull base intact. Maintenance of vertebral body height. Straightening of expected cervical lordosis. Presumed degenerative fusion of C3 through C5. Near complete degenerative fusion at C6-7. Trace C7-T1 anterolisthesis is not significantly changed. Multilevel facet arthropathy is worse on the right. Facets are well-aligned. Coronal reformats demonstrate only degenerative change about the C1-2 articulation, primarily left-sided. IMPRESSION: 1. No acute intracranial abnormality. Cerebral atrophy  and small vessel ischemic change. 2. Advanced cervical spondylosis, without acute fracture or subluxation. Electronically Signed   By: Abigail Miyamoto M.D.   On: 05/19/2016 10:50  Ct Cervical Spine Wo Contrast  Result Date: 05/19/2016 EXAM: CT HEAD WITHOUT CONTRAST CT CERVICAL SPINE WITHOUT CONTRAST TECHNIQUE: Multidetector CT imaging of the head and cervical spine was performed following the standard protocol without intravenous contrast. Multiplanar  CT image reconstructions of the cervical spine were also generated. COMPARISON:  04/12/2016. FINDINGS: CT HEAD FINDINGS Brain: Moderate low density in the periventricular white matter likely related to small vessel disease. Cerebellar and cerebral atrophy which is expected for age. No mass lesion, hemorrhage, hydrocephalus, acute infarct, intra-axial, or extra-axial fluid collection. Vascular: Bilateral vertebral and carotid artery atherosclerosis. Skull: No significant soft tissue swelling.  No skull fracture. Sinuses/Orbits: Surgical changes about the globes. Clear paranasal sinuses and mastoid air cells. Other: None. CT CERVICAL SPINE FINDINGS Spinal visualization through the bottom of T2. Prevertebral soft tissues are within normal limits. Bilateral carotid atherosclerosis. No apical pneumothorax. Multilevel cervical spondylosis, resulting in areas of central canal and neural foraminal narrowing. Most significant at C4-5, C5-6, and C6-7. Skull base intact. Maintenance of vertebral body height. Straightening of expected cervical lordosis. Presumed degenerative fusion of C3 through C5. Near complete degenerative fusion at C6-7. Trace C7-T1 anterolisthesis is not significantly changed. Multilevel facet arthropathy is worse on the right. Facets are well-aligned. Coronal reformats demonstrate only degenerative change about the C1-2 articulation, primarily left-sided. IMPRESSION: 1. No acute intracranial abnormality. Cerebral atrophy and small vessel ischemic change. 2.  Advanced cervical spondylosis, without acute fracture or subluxation. Electronically Signed   By: Abigail Miyamoto M.D.   On: 05/19/2016 10:50  US Renal  Result Date: 05/20/2016 CLINICAL DATA:  82 year old diabetic male with acute renal failure. Initial encounter. EXAM: RENAL / URINARY TRACT ULTRASOUND COMPLETE COMPARISON:  None. FINDINGS: Right Kidney: Length: 11.6 cm. Renal parenchymal thinning. No hydronephrosis. There may be a tiny nonobstructing renal calculi. Left Kidney: Length: 11.5 cm. Renal parenchymal thinning. No hydronephrosis. Upper pole 3 cm cyst. Bladder: Appears normal for degree of bladder distention. IMPRESSION: Bilateral renal parenchymal thinning. No hydronephrosis. Question tiny nonobstructing right renal calculus. 3 cm left upper pole renal cyst. Electronically Signed   By: Genia Del M.D.   On: 05/20/2016 13:06   ASSESSMENT/PLAN:  Physical deconditioning - continue rehabilitation, PT and OT for therapeutic exercises; fall precaution  Urinary retention - continue foley catheter; follows-up with urology  Iron deficiency anemia - discontinue FeSO4; CBC in 1 week Lab Results  Component Value Date   HGB 13.3 (A) 05/27/2016   Bilateral ectropion - continue Methylcellulose 1% opthalmic solution 2 gtts to both eyes QID  CAD - stable; continue ASA EC 81 mg 1 tab daily  BPH - continue Finasteride 5 mg 1 tab by mouth daily  Leg edema - stable; continue Lasix 40 mg 1 tab by mouth daily  GERD - no complaints of abdominal pain; continue Protonix 40 mg 1 tab by mouth daily  Hypokalemia - discontinue KCl ER 40 meq daily; start KCL ER  20 meq 1 tab daily; BMP in 1 week Lab Results  Component Value Date   K 4.4 05/30/2016   Hyperlipidemia - continue atorvastatin 10 mg give 1/2 tab = 5 mg by mouth at bedtime  Abnormal chest x-ray finding - no fever nor SOB; repeat chest x-ray; encouraged to use incentive spirometer  Protein calorie malnutrition - albumin 3.13; continue med  Pass 120 mL by mouth twice a day; RD consult      Goals of care:  Short-term rehabilitation   Durenda Age, NP [place] [phone]

## 2016-06-24 LAB — CBC AND DIFFERENTIAL
HEMATOCRIT: 45 % (ref 41–53)
HEMOGLOBIN: 14.4 g/dL (ref 13.5–17.5)
Platelets: 248 10*3/uL (ref 150–399)
WBC: 9.1 10^3/mL

## 2016-06-24 LAB — BASIC METABOLIC PANEL
BUN: 27 mg/dL — AB (ref 4–21)
Creatinine: 1.2 mg/dL (ref 0.6–1.3)
GLUCOSE: 104 mg/dL
POTASSIUM: 4.6 mmol/L (ref 3.4–5.3)
SODIUM: 143 mmol/L (ref 137–147)

## 2016-06-25 ENCOUNTER — Non-Acute Institutional Stay (SKILLED_NURSING_FACILITY): Payer: Medicare Other | Admitting: Adult Health

## 2016-06-25 ENCOUNTER — Encounter: Payer: Self-pay | Admitting: Adult Health

## 2016-06-25 DIAGNOSIS — E876 Hypokalemia: Secondary | ICD-10-CM

## 2016-06-25 DIAGNOSIS — K219 Gastro-esophageal reflux disease without esophagitis: Secondary | ICD-10-CM | POA: Diagnosis not present

## 2016-06-25 DIAGNOSIS — I2510 Atherosclerotic heart disease of native coronary artery without angina pectoris: Secondary | ICD-10-CM

## 2016-06-25 DIAGNOSIS — R5381 Other malaise: Secondary | ICD-10-CM

## 2016-06-25 DIAGNOSIS — N400 Enlarged prostate without lower urinary tract symptoms: Secondary | ICD-10-CM

## 2016-06-25 DIAGNOSIS — R609 Edema, unspecified: Secondary | ICD-10-CM

## 2016-06-25 DIAGNOSIS — R9389 Abnormal findings on diagnostic imaging of other specified body structures: Secondary | ICD-10-CM

## 2016-06-25 DIAGNOSIS — N139 Obstructive and reflux uropathy, unspecified: Secondary | ICD-10-CM | POA: Diagnosis not present

## 2016-06-25 DIAGNOSIS — E785 Hyperlipidemia, unspecified: Secondary | ICD-10-CM

## 2016-06-25 DIAGNOSIS — H21243 Degeneration of pupillary margin, bilateral: Secondary | ICD-10-CM | POA: Diagnosis not present

## 2016-06-25 DIAGNOSIS — E46 Unspecified protein-calorie malnutrition: Secondary | ICD-10-CM

## 2016-06-25 DIAGNOSIS — H2189 Other specified disorders of iris and ciliary body: Secondary | ICD-10-CM

## 2016-06-25 DIAGNOSIS — R938 Abnormal findings on diagnostic imaging of other specified body structures: Secondary | ICD-10-CM

## 2016-06-25 DIAGNOSIS — K5901 Slow transit constipation: Secondary | ICD-10-CM

## 2016-06-25 NOTE — Progress Notes (Signed)
Patient ID: Ryan Pacheco, male   DOB: [birth year], 82 y.o.   MRN: [id number]    DATE:  06/25/2016   MRN:  [id number]  BIRTHDAY: 1920-06-23  Facility:  Nursing Home Location:  [place] and [place] Room Number: 308-P  LEVEL OF CARE:  SNF [phone])  Contact Information    Name [place] Daughter [phone]     Alley,Thomas Relative [phone]  [phone]       Code Status History    Date Active Date Inactive Code Status Order ID Comments User Context   05/20/2016 10:41 AM 05/22/2016  6:47 PM DNR [id number]  Thurnell Lose, MD Inpatient   05/19/2016  4:33 PM 05/20/2016 10:40 AM Full Code [id number]  Verlee Monte, MD Inpatient   11/26/2011 12:27 PM 11/29/2011  9:31 AM Full Code [id number]  Carolan Clines, RN Inpatient    Questions for Most Recent Historical Code Status (Order [id number])    Question Answer Comment   In the event of cardiac or respiratory ARREST Do not call a "code blue"    In the event of cardiac or respiratory ARREST Do not perform Intubation, CPR, defibrillation or ACLS    In the event of cardiac or respiratory ARREST Use medication by any route, position, wound care, and other measures to relive pain and suffering. May use oxygen, suction and manual treatment of airway obstruction as needed for comfort.         Advance Directive Documentation   Flowsheet Row Most Recent Value  Type of Advance Directive  Out of facility DNR (pink MOST or yellow form)  Pre-existing out of facility DNR order (yellow form or pink MOST form)  No data  "MOST" Form in Place?  No data       Chief Complaint  Patient presents with  . Discharge Note    HISTORY OF PRESENT ILLNESS:  This is a 82 year old male who is for discharge home with Home health PT, OT, CNA and Nursing for wound care. DME:  Standard wheelchair, anti-tippers, elevating leg rests and gel cushion.  He has been admitted to [facility] on 05/22/16 from [facility] S/P fall with acute renal failure and hyperkalemia. CT head was negative for acute intracranial abnormalities. He had bladder outlet obstruction and required foley catheter. Urology was consulted and flmax was discontinued. He was given insulin and sodium bicarbonate for his hyperkalemia and gentle hydration and his renal function has improved.  Patient was admitted to this facility for short-term rehabilitation after the patient's recent hospitalization.  Patient has completed SNF rehabilitation and therapy has cleared the patient for discharge.   PAST MEDICAL HISTORY:  Past Medical History:  Diagnosis Date  . Acid reflux   . Angina   . Arthritis 01-09-12   hands, foot  . Benign prostatic hypertrophy   . Bradycardia   . Diabetes mellitus   . Edema   . Embolism - blood clot ~ 2000   "behind left knee"  . History of asbestos exposure    "have some lung disease from years of exposure"  . Myocardial infarction ([place]) 11/17/10    NSTEMI  . Prostate hypertrophy 01-09-12   urinary retention-Foley catheter at present; surgery planned     CURRENT MEDICATIONS: Reviewed  Patient's Medications  New Prescriptions   No medications on file  Previous Medications   ACETAMINOPHEN (TYLENOL) 325 MG TABLET    Take 650 mg by mouth every 6 (six) hours  as needed for mild pain.   ASPIRIN 81 MG TABLET    Take 81 mg by mouth daily.    ATORVASTATIN (LIPITOR) 10 MG TABLET    Take 5 mg by mouth daily.    CALCIUM-VITAMIN D PO    Take 600 mg by mouth daily.    DOCUSATE SODIUM (COLACE) 100 MG CAPSULE    Take 100 mg by mouth 2 (two) times daily.   FINASTERIDE (PROSCAR) 5 MG TABLET    Take 1 tablet (5 mg total) by mouth daily.   FUROSEMIDE (LASIX) 40 MG TABLET    Take 40 mg by mouth daily after breakfast. May take extra dose of Lasix up to 2 times weekly for edema.   METHYLCELLULOSE (ARTIFICIAL TEARS) 1 % OPHTHALMIC SOLUTION    Place 2 drops into both eyes 4 (four) times daily.   MULTIPLE VITAMIN  (MULITIVITAMIN WITH MINERALS) TABS    Take 1 tablet by mouth daily.   PANTOPRAZOLE (PROTONIX) 40 MG TABLET    Take 40 mg by mouth daily.    POTASSIUM CHLORIDE SA (K-DUR,KLOR-CON) 20 MEQ TABLET    Take 20 mEq by mouth daily.    UNABLE TO FIND    Med Name: Med pass 120 mL 2 times daily  Modified Medications   No medications on file  Discontinued Medications   FERROUS SULFATE 325 (65 FE) MG TABLET    Take 325 mg by mouth 2 (two) times daily.      No Known Allergies   REVIEW OF SYSTEMS:  GENERAL: no change in appetite, no fatigue, no weight changes, no fever, chills or weakness EYES: Denies change in vision, dry eyes, eye pain, itching or discharge EARS: Denies change in hearing, ringing in ears, or earache NOSE: Denies nasal congestion or epistaxis MOUTH and THROAT: Denies oral discomfort, gingival pain or bleeding, pain from teeth or hoarseness   RESPIRATORY: no cough, SOB, DOE, wheezing, hemoptysis CARDIAC: no chest pain, edema or palpitations GI: no abdominal pain, diarrhea, constipation, heart burn, nausea or vomiting GU: Denies dysuria, frequency, hematuria, incontinence, or discharge PSYCHIATRIC: Denies feeling of depression or anxiety. No report of hallucinations, insomnia, paranoia, or agitation     PHYSICAL EXAMINATION  GENERAL APPEARANCE: Well nourished. In no acute distress. Normal body habitus SKIN:  Skin is warm and dry.  HEAD: Normal in size and contour. No evidence of trauma EYES: Bilateral ectropion noted. No blepharitis, entropion or ectropion. PERRL. Conjunctivae are clear and sclerae are white. Lenses are without opacity EARS: Pinnae are normal. Hard of hearing MOUTH and THROAT: Lips are without lesions. Oral mucosa is moist and without lesions. Tongue is normal in shape, size, and color and without lesions NECK: supple, trachea midline, no neck masses, no thyroid tenderness, no thyromegaly LYMPHATICS: no LAN in the neck, no supraclavicular LAN RESPIRATORY:  breathing is even & unlabored, BS CTAB CARDIAC: RRR, no murmur,no extra heart sounds, BLE trace edema GI: abdomen soft, normal BS, no masses, no tenderness, no hepatomegaly, no splenomegaly GU:  Has foley catheter attached to urine leg bag; has clear yellowish urine EXTREMITIES:  Able to move X 4 extremities PSYCHIATRIC: Alert to place and person, disoriented to time. Affect and behavior are appropriate  LABS/RADIOLOGY: Labs reviewed: Basic Metabolic Panel:  Recent Labs  05/20/16 0518 05/21/16 0515 05/22/16 0536  05/27/16 1311 05/30/16 1351 06/24/16 0500  NA 139 143 141  < > 147 143 143  K 4.4 4.4 5.2*  < > 4.3 4.4 4.6  CL 111 114* 109  --   --   --   --  CO2 21* 23 28  --   --   --   --   GLUCOSE 97 105* 102*  --   --   --   --   BUN 47* 24* 18  < > 22* 20 27*  CREATININE 2.80* 1.28* 1.27*  < > 1.0 0.9 1.2  CALCIUM 8.4* 8.7* 8.5*  --   --   --   --   MG 2.4  --   --   --   --   --   --   < > = values in this interval not displayed.  Liver Function Tests:  Recent Labs  05/19/16 1036 05/27/16 1311  AST 19 24  ALT 17 46*  ALKPHOS 60 64  BILITOT 0.8  --   PROT 7.2  --   ALBUMIN 4.1  --     CBC:  Recent Labs  04/12/16 1500 05/19/16 1036 05/19/16 1651 05/20/16 0518 05/27/16 1311 06/24/16 0500  WBC 8.9 15.9* 12.9* 9.2 9.6 9.1  NEUTROABS 6.6 13.5*  --   --   --   --   HGB 13.6 13.1 11.7* 10.4* 13.3* 14.4  HCT 41.4 38.2* 33.5* 31.7* 41 45  MCV 96.1 94.6 93.3 96.4  --   --   PLT 171 151 148* 144* 238 248   Cardiac Enzymes:  Recent Labs  05/20/16 0021 05/20/16 0518 05/20/16 1211  TROPONINI 0.05* 0.04* 0.04*   CBG:  Recent Labs  05/19/16 1421  GLUCAP 134*       ASSESSMENT/PLAN:  Physical deconditioning - for Home health PT, CNA, Nursing and OT for therapeutic exercises; fall precaution  Urinary outflow obstruction - continue foley catheter; follows-up with urology  Bilateral ectropion - continue Methylcellulose 1% opthalmic solution 2 gtts  to both eyes QID  CAD - stable; continue ASA EC 81 mg 1 tab daily  BPH - continue Finasteride 5 mg 1 tab by mouth daily  Leg edema - stable; continue Lasix 40 mg 1 tab by mouth daily  GERD - no complaints of abdominal pain; continue Protonix 40 mg 1 tab by mouth daily  Hypokalemia - discontinue KCl ER 20 meq daily Lab Results  Component Value Date   K 4.6 06/24/2016   Hyperlipidemia - continue atorvastatin 10 mg give 1/2 tab = 5 mg by mouth at bedtime  Abnormal chest x-ray finding - no fever nor SOB; repeat chest x-ray; encouraged to use incentive spirometer  Protein calorie malnutrition - albumin 3.13; continue med Pass 120 mL by mouth twice a day; referred to RD  Constipation - continue Colace 100 mg BID      I have filled out patient's discharge paperwork and written prescriptions.  Patient will receive home health PT, OT, Nursing and CNA.  DME provided:  Standard wheelchair, anti-tippers, elevating leg rests and cushion  Total discharge time: Greater than 30 minutes Greater than 50% was spent in counseling and coordination of care with the patient.   Discharge time involved coordination of the discharge process with social worker, nursing staff and therapy department. Medical justification for home health services/DME verified.    Durenda Age, NP [employer] [phone]

## 2016-06-27 ENCOUNTER — Other Ambulatory Visit: Payer: Self-pay | Admitting: Internal Medicine

## 2016-06-28 DIAGNOSIS — Z466 Encounter for fitting and adjustment of urinary device: Secondary | ICD-10-CM | POA: Diagnosis not present

## 2016-06-28 DIAGNOSIS — N139 Obstructive and reflux uropathy, unspecified: Secondary | ICD-10-CM | POA: Diagnosis not present

## 2016-06-28 DIAGNOSIS — L89151 Pressure ulcer of sacral region, stage 1: Secondary | ICD-10-CM | POA: Diagnosis not present

## 2016-06-28 DIAGNOSIS — N401 Enlarged prostate with lower urinary tract symptoms: Secondary | ICD-10-CM | POA: Diagnosis not present

## 2016-06-28 DIAGNOSIS — R338 Other retention of urine: Secondary | ICD-10-CM | POA: Diagnosis not present

## 2016-06-28 DIAGNOSIS — N179 Acute kidney failure, unspecified: Secondary | ICD-10-CM | POA: Diagnosis not present

## 2016-07-02 DIAGNOSIS — R338 Other retention of urine: Secondary | ICD-10-CM | POA: Diagnosis not present

## 2016-07-02 DIAGNOSIS — N179 Acute kidney failure, unspecified: Secondary | ICD-10-CM | POA: Diagnosis not present

## 2016-07-02 DIAGNOSIS — Z466 Encounter for fitting and adjustment of urinary device: Secondary | ICD-10-CM | POA: Diagnosis not present

## 2016-07-02 DIAGNOSIS — L89151 Pressure ulcer of sacral region, stage 1: Secondary | ICD-10-CM | POA: Diagnosis not present

## 2016-07-02 DIAGNOSIS — N401 Enlarged prostate with lower urinary tract symptoms: Secondary | ICD-10-CM | POA: Diagnosis not present

## 2016-07-02 DIAGNOSIS — N139 Obstructive and reflux uropathy, unspecified: Secondary | ICD-10-CM | POA: Diagnosis not present

## 2016-07-03 DIAGNOSIS — R338 Other retention of urine: Secondary | ICD-10-CM | POA: Diagnosis not present

## 2016-07-03 DIAGNOSIS — N179 Acute kidney failure, unspecified: Secondary | ICD-10-CM | POA: Diagnosis not present

## 2016-07-03 DIAGNOSIS — N401 Enlarged prostate with lower urinary tract symptoms: Secondary | ICD-10-CM | POA: Diagnosis not present

## 2016-07-03 DIAGNOSIS — Z466 Encounter for fitting and adjustment of urinary device: Secondary | ICD-10-CM | POA: Diagnosis not present

## 2016-07-03 DIAGNOSIS — N139 Obstructive and reflux uropathy, unspecified: Secondary | ICD-10-CM | POA: Diagnosis not present

## 2016-07-03 DIAGNOSIS — L89151 Pressure ulcer of sacral region, stage 1: Secondary | ICD-10-CM | POA: Diagnosis not present

## 2016-07-04 DIAGNOSIS — N139 Obstructive and reflux uropathy, unspecified: Secondary | ICD-10-CM | POA: Diagnosis not present

## 2016-07-04 DIAGNOSIS — N401 Enlarged prostate with lower urinary tract symptoms: Secondary | ICD-10-CM | POA: Diagnosis not present

## 2016-07-04 DIAGNOSIS — Z466 Encounter for fitting and adjustment of urinary device: Secondary | ICD-10-CM | POA: Diagnosis not present

## 2016-07-04 DIAGNOSIS — L89151 Pressure ulcer of sacral region, stage 1: Secondary | ICD-10-CM | POA: Diagnosis not present

## 2016-07-04 DIAGNOSIS — N179 Acute kidney failure, unspecified: Secondary | ICD-10-CM | POA: Diagnosis not present

## 2016-07-04 DIAGNOSIS — R338 Other retention of urine: Secondary | ICD-10-CM | POA: Diagnosis not present

## 2016-07-05 DIAGNOSIS — Z466 Encounter for fitting and adjustment of urinary device: Secondary | ICD-10-CM | POA: Diagnosis not present

## 2016-07-05 DIAGNOSIS — N401 Enlarged prostate with lower urinary tract symptoms: Secondary | ICD-10-CM | POA: Diagnosis not present

## 2016-07-05 DIAGNOSIS — N179 Acute kidney failure, unspecified: Secondary | ICD-10-CM | POA: Diagnosis not present

## 2016-07-05 DIAGNOSIS — N139 Obstructive and reflux uropathy, unspecified: Secondary | ICD-10-CM | POA: Diagnosis not present

## 2016-07-05 DIAGNOSIS — L89151 Pressure ulcer of sacral region, stage 1: Secondary | ICD-10-CM | POA: Diagnosis not present

## 2016-07-05 DIAGNOSIS — R338 Other retention of urine: Secondary | ICD-10-CM | POA: Diagnosis not present

## 2016-07-08 DIAGNOSIS — Z466 Encounter for fitting and adjustment of urinary device: Secondary | ICD-10-CM | POA: Diagnosis not present

## 2016-07-08 DIAGNOSIS — N139 Obstructive and reflux uropathy, unspecified: Secondary | ICD-10-CM | POA: Diagnosis not present

## 2016-07-08 DIAGNOSIS — R338 Other retention of urine: Secondary | ICD-10-CM | POA: Diagnosis not present

## 2016-07-08 DIAGNOSIS — N179 Acute kidney failure, unspecified: Secondary | ICD-10-CM | POA: Diagnosis not present

## 2016-07-08 DIAGNOSIS — L89151 Pressure ulcer of sacral region, stage 1: Secondary | ICD-10-CM | POA: Diagnosis not present

## 2016-07-08 DIAGNOSIS — N401 Enlarged prostate with lower urinary tract symptoms: Secondary | ICD-10-CM | POA: Diagnosis not present

## 2016-07-09 DIAGNOSIS — N179 Acute kidney failure, unspecified: Secondary | ICD-10-CM | POA: Diagnosis not present

## 2016-07-09 DIAGNOSIS — L89151 Pressure ulcer of sacral region, stage 1: Secondary | ICD-10-CM | POA: Diagnosis not present

## 2016-07-09 DIAGNOSIS — R338 Other retention of urine: Secondary | ICD-10-CM | POA: Diagnosis not present

## 2016-07-09 DIAGNOSIS — Z466 Encounter for fitting and adjustment of urinary device: Secondary | ICD-10-CM | POA: Diagnosis not present

## 2016-07-09 DIAGNOSIS — N139 Obstructive and reflux uropathy, unspecified: Secondary | ICD-10-CM | POA: Diagnosis not present

## 2016-07-09 DIAGNOSIS — N401 Enlarged prostate with lower urinary tract symptoms: Secondary | ICD-10-CM | POA: Diagnosis not present

## 2016-07-11 DIAGNOSIS — R338 Other retention of urine: Secondary | ICD-10-CM | POA: Diagnosis not present

## 2016-07-11 DIAGNOSIS — N139 Obstructive and reflux uropathy, unspecified: Secondary | ICD-10-CM | POA: Diagnosis not present

## 2016-07-11 DIAGNOSIS — N401 Enlarged prostate with lower urinary tract symptoms: Secondary | ICD-10-CM | POA: Diagnosis not present

## 2016-07-11 DIAGNOSIS — Z466 Encounter for fitting and adjustment of urinary device: Secondary | ICD-10-CM | POA: Diagnosis not present

## 2016-07-11 DIAGNOSIS — N179 Acute kidney failure, unspecified: Secondary | ICD-10-CM | POA: Diagnosis not present

## 2016-07-11 DIAGNOSIS — L89151 Pressure ulcer of sacral region, stage 1: Secondary | ICD-10-CM | POA: Diagnosis not present

## 2016-07-12 DIAGNOSIS — N139 Obstructive and reflux uropathy, unspecified: Secondary | ICD-10-CM | POA: Diagnosis not present

## 2016-07-12 DIAGNOSIS — L89151 Pressure ulcer of sacral region, stage 1: Secondary | ICD-10-CM | POA: Diagnosis not present

## 2016-07-12 DIAGNOSIS — N179 Acute kidney failure, unspecified: Secondary | ICD-10-CM | POA: Diagnosis not present

## 2016-07-12 DIAGNOSIS — N401 Enlarged prostate with lower urinary tract symptoms: Secondary | ICD-10-CM | POA: Diagnosis not present

## 2016-07-12 DIAGNOSIS — Z466 Encounter for fitting and adjustment of urinary device: Secondary | ICD-10-CM | POA: Diagnosis not present

## 2016-07-12 DIAGNOSIS — R338 Other retention of urine: Secondary | ICD-10-CM | POA: Diagnosis not present

## 2016-07-16 DIAGNOSIS — R338 Other retention of urine: Secondary | ICD-10-CM | POA: Diagnosis not present

## 2016-07-16 DIAGNOSIS — N139 Obstructive and reflux uropathy, unspecified: Secondary | ICD-10-CM | POA: Diagnosis not present

## 2016-07-16 DIAGNOSIS — Z466 Encounter for fitting and adjustment of urinary device: Secondary | ICD-10-CM | POA: Diagnosis not present

## 2016-07-16 DIAGNOSIS — N179 Acute kidney failure, unspecified: Secondary | ICD-10-CM | POA: Diagnosis not present

## 2016-07-16 DIAGNOSIS — N401 Enlarged prostate with lower urinary tract symptoms: Secondary | ICD-10-CM | POA: Diagnosis not present

## 2016-07-16 DIAGNOSIS — L89151 Pressure ulcer of sacral region, stage 1: Secondary | ICD-10-CM | POA: Diagnosis not present

## 2016-07-18 DIAGNOSIS — R338 Other retention of urine: Secondary | ICD-10-CM | POA: Diagnosis not present

## 2016-07-18 DIAGNOSIS — N401 Enlarged prostate with lower urinary tract symptoms: Secondary | ICD-10-CM | POA: Diagnosis not present

## 2016-07-18 DIAGNOSIS — N139 Obstructive and reflux uropathy, unspecified: Secondary | ICD-10-CM | POA: Diagnosis not present

## 2016-07-18 DIAGNOSIS — N179 Acute kidney failure, unspecified: Secondary | ICD-10-CM | POA: Diagnosis not present

## 2016-07-18 DIAGNOSIS — L89151 Pressure ulcer of sacral region, stage 1: Secondary | ICD-10-CM | POA: Diagnosis not present

## 2016-07-18 DIAGNOSIS — Z466 Encounter for fitting and adjustment of urinary device: Secondary | ICD-10-CM | POA: Diagnosis not present

## 2016-07-23 ENCOUNTER — Other Ambulatory Visit: Payer: Self-pay | Admitting: Internal Medicine

## 2016-07-23 DIAGNOSIS — N179 Acute kidney failure, unspecified: Secondary | ICD-10-CM | POA: Diagnosis not present

## 2016-07-23 DIAGNOSIS — Z466 Encounter for fitting and adjustment of urinary device: Secondary | ICD-10-CM | POA: Diagnosis not present

## 2016-07-23 DIAGNOSIS — L89151 Pressure ulcer of sacral region, stage 1: Secondary | ICD-10-CM | POA: Diagnosis not present

## 2016-07-23 DIAGNOSIS — N401 Enlarged prostate with lower urinary tract symptoms: Secondary | ICD-10-CM | POA: Diagnosis not present

## 2016-07-23 DIAGNOSIS — N139 Obstructive and reflux uropathy, unspecified: Secondary | ICD-10-CM | POA: Diagnosis not present

## 2016-07-23 DIAGNOSIS — R338 Other retention of urine: Secondary | ICD-10-CM | POA: Diagnosis not present

## 2016-07-24 DIAGNOSIS — R338 Other retention of urine: Secondary | ICD-10-CM | POA: Diagnosis not present

## 2016-07-24 DIAGNOSIS — N139 Obstructive and reflux uropathy, unspecified: Secondary | ICD-10-CM | POA: Diagnosis not present

## 2016-07-24 DIAGNOSIS — L89151 Pressure ulcer of sacral region, stage 1: Secondary | ICD-10-CM | POA: Diagnosis not present

## 2016-07-24 DIAGNOSIS — N401 Enlarged prostate with lower urinary tract symptoms: Secondary | ICD-10-CM | POA: Diagnosis not present

## 2016-07-24 DIAGNOSIS — N179 Acute kidney failure, unspecified: Secondary | ICD-10-CM | POA: Diagnosis not present

## 2016-07-24 DIAGNOSIS — Z466 Encounter for fitting and adjustment of urinary device: Secondary | ICD-10-CM | POA: Diagnosis not present

## 2016-07-29 DIAGNOSIS — R338 Other retention of urine: Secondary | ICD-10-CM | POA: Diagnosis not present

## 2016-07-29 DIAGNOSIS — L89151 Pressure ulcer of sacral region, stage 1: Secondary | ICD-10-CM | POA: Diagnosis not present

## 2016-07-29 DIAGNOSIS — N179 Acute kidney failure, unspecified: Secondary | ICD-10-CM | POA: Diagnosis not present

## 2016-07-29 DIAGNOSIS — N139 Obstructive and reflux uropathy, unspecified: Secondary | ICD-10-CM | POA: Diagnosis not present

## 2016-07-29 DIAGNOSIS — N401 Enlarged prostate with lower urinary tract symptoms: Secondary | ICD-10-CM | POA: Diagnosis not present

## 2016-07-29 DIAGNOSIS — Z23 Encounter for immunization: Secondary | ICD-10-CM | POA: Diagnosis not present

## 2016-07-29 DIAGNOSIS — Z466 Encounter for fitting and adjustment of urinary device: Secondary | ICD-10-CM | POA: Diagnosis not present

## 2016-07-30 DIAGNOSIS — N401 Enlarged prostate with lower urinary tract symptoms: Secondary | ICD-10-CM | POA: Diagnosis not present

## 2016-07-30 DIAGNOSIS — N139 Obstructive and reflux uropathy, unspecified: Secondary | ICD-10-CM | POA: Diagnosis not present

## 2016-07-30 DIAGNOSIS — L89151 Pressure ulcer of sacral region, stage 1: Secondary | ICD-10-CM | POA: Diagnosis not present

## 2016-07-30 DIAGNOSIS — N179 Acute kidney failure, unspecified: Secondary | ICD-10-CM | POA: Diagnosis not present

## 2016-07-30 DIAGNOSIS — R338 Other retention of urine: Secondary | ICD-10-CM | POA: Diagnosis not present

## 2016-07-30 DIAGNOSIS — Z466 Encounter for fitting and adjustment of urinary device: Secondary | ICD-10-CM | POA: Diagnosis not present

## 2016-07-31 DIAGNOSIS — N179 Acute kidney failure, unspecified: Secondary | ICD-10-CM | POA: Diagnosis not present

## 2016-07-31 DIAGNOSIS — Z466 Encounter for fitting and adjustment of urinary device: Secondary | ICD-10-CM | POA: Diagnosis not present

## 2016-07-31 DIAGNOSIS — N139 Obstructive and reflux uropathy, unspecified: Secondary | ICD-10-CM | POA: Diagnosis not present

## 2016-07-31 DIAGNOSIS — N401 Enlarged prostate with lower urinary tract symptoms: Secondary | ICD-10-CM | POA: Diagnosis not present

## 2016-07-31 DIAGNOSIS — R338 Other retention of urine: Secondary | ICD-10-CM | POA: Diagnosis not present

## 2016-07-31 DIAGNOSIS — L89151 Pressure ulcer of sacral region, stage 1: Secondary | ICD-10-CM | POA: Diagnosis not present

## 2016-08-01 ENCOUNTER — Encounter (HOSPITAL_COMMUNITY): Payer: Self-pay

## 2016-08-01 ENCOUNTER — Encounter (HOSPITAL_COMMUNITY)
Admission: RE | Admit: 2016-08-01 | Discharge: 2016-08-01 | Disposition: A | Discharge status: Home / Self Care | Payer: Medicare Other | Source: Ambulatory Visit | Attending: Urology | Admitting: Urology

## 2016-08-01 DIAGNOSIS — R338 Other retention of urine: Secondary | ICD-10-CM | POA: Diagnosis not present

## 2016-08-01 DIAGNOSIS — Z01812 Encounter for preprocedural laboratory examination: Secondary | ICD-10-CM | POA: Diagnosis not present

## 2016-08-01 DIAGNOSIS — Z01818 Encounter for other preprocedural examination: Secondary | ICD-10-CM | POA: Diagnosis not present

## 2016-08-01 HISTORY — DX: Presence of other specified devices: Z97.8

## 2016-08-01 HISTORY — DX: Chronic kidney disease, unspecified: N18.9

## 2016-08-01 HISTORY — DX: Other abnormalities of gait and mobility: R26.89

## 2016-08-01 HISTORY — DX: Unspecified hearing loss, bilateral: H91.93

## 2016-08-01 HISTORY — DX: Presence of urogenital implants: Z96.0

## 2016-08-01 LAB — BASIC METABOLIC PANEL
Anion gap: 8 (ref 5–15)
BUN: 20 mg/dL (ref 6–20)
CHLORIDE: 109 mmol/L (ref 101–111)
CO2: 25 mmol/L (ref 22–32)
Calcium: 9.1 mg/dL (ref 8.9–10.3)
Creatinine, Ser: 1.07 mg/dL (ref 0.61–1.24)
GFR calc Af Amer: >60 mL/min (ref >60)
GFR calc non Af Amer: 57 mL/min — ABNORMAL LOW (ref >60)
Glucose, Bld: 111 mg/dL — ABNORMAL HIGH (ref 65–99)
POTASSIUM: 4.8 mmol/L (ref 3.5–5.1)
SODIUM: 142 mmol/L (ref 135–145)

## 2016-08-01 LAB — CBC
HEMATOCRIT: 37.9 % — AB (ref 39.0–52.0)
Hemoglobin: 12.5 g/dL — ABNORMAL LOW (ref 13.0–17.0)
MCH: 31.4 pg (ref 26.0–34.0)
MCHC: 33 g/dL (ref 30.0–36.0)
MCV: 95.2 fL (ref 78.0–100.0)
Platelets: 251 10*3/uL (ref 150–400)
RBC: 3.98 MIL/uL — ABNORMAL LOW (ref 4.22–5.81)
RDW: 14.2 % (ref 11.5–15.5)
WBC: 11 10*3/uL — AB (ref 4.0–10.5)

## 2016-08-01 LAB — SURGICAL PCR SCREEN
MRSA, PCR: NEGATIVE
Staphylococcus aureus: POSITIVE — AB

## 2016-08-01 NOTE — Pre-Procedure Instructions (Addendum)
EKG/ CXR 7'17 Epic. Dr. Irish Lack LOV notes [place].

## 2016-08-01 NOTE — Patient Instructions (Addendum)
JAM LANA  08/01/2016   Your procedure is scheduled on: 08-05-16  Report to [facility] Main  Entrance take [facility]  elevators to 3rd floor to  [place] at   0830 AM.  Call this number if you have problems the morning of surgery [phone]   Remember: ONLY 1 PERSON MAY GO WITH YOU TO SHORT STAY TO GET  READY MORNING OF [place].  Do not eat food or drink liquids :After Midnight.     Take these medicines the morning of surgery with A SIP OF WATER: Finasteride. Pantoprazole. Refresh eye drops-usual. Tylenol-if needed. DO NOT TAKE ANY DIABETIC MEDICATIONS DAY OF YOUR SURGERY                               You may not have any metal on your body including hair pins and              piercings  Do not wear jewelry, make-up, lotions, powders or perfumes, deodorant             Do not wear nail polish.  Do not shave  48 hours prior to surgery.              Men may shave face and neck.   Do not bring valuables to the hospital. [place].  Contacts, dentures or bridgework may not be worn into surgery.  Leave suitcase in the car. After surgery it may be brought to your room.     Patients discharged the day of surgery will not be allowed to drive home.  Name and phone number of your driver: Jeffie Pollock [phone] h/ [phone] c  Special Instructions: N/A              Please read over the following fact sheets you were given: _____________________________________________________________________             [facility] - Preparing for Surgery Before surgery, you can play an important role.  Because skin is not sterile, your skin needs to be as free of germs as possible.  You can reduce the number of germs on your skin by washing with CHG (chlorahexidine gluconate) soap before surgery.  CHG is an antiseptic cleaner which kills germs and bonds with the skin to continue killing germs even after  washing. Please DO NOT use if you have an allergy to CHG or antibacterial soaps.  If your skin becomes reddened/irritated stop using the CHG and inform your nurse when you arrive at Short Stay. Do not shave (including legs and underarms) for at least 48 hours prior to the first CHG shower.  You may shave your face/neck. Please follow these instructions carefully:  1.  Shower with CHG Soap the night before surgery and the  morning of Surgery.  2.  If you choose to wash your hair, wash your hair first as usual with your  normal  shampoo.  3.  After you shampoo, rinse your hair and body thoroughly to remove the  shampoo.                           4.  Use CHG as you would any other liquid soap.  You can apply chg directly  to the skin and wash                       Gently with a scrungie or clean washcloth.  5.  Apply the CHG Soap to your body ONLY FROM THE NECK DOWN.   Do not use on face/ open                           Wound or open sores. Avoid contact with eyes, ears mouth and genitals (private parts).                       Wash face,  Genitals (private parts) with your normal soap.             6.  Wash thoroughly, paying special attention to the area where your surgery  will be performed.  7.  Thoroughly rinse your body with warm water from the neck down.  8.  DO NOT shower/wash with your normal soap after using and rinsing off  the CHG Soap.                9.  Pat yourself dry with a clean towel.            10.  Wear clean pajamas.            11.  Place clean sheets on your bed the night of your first shower and do not  sleep with pets. Day of Surgery : Do not apply any lotions/deodorants the morning of surgery.  Please wear clean clothes to the hospital/surgery center.  FAILURE TO FOLLOW THESE INSTRUCTIONS MAY RESULT IN THE CANCELLATION OF YOUR SURGERY PATIENT SIGNATURE_________________________________  NURSE  SIGNATURE__________________________________  ________________________________________________________________________

## 2016-08-02 DIAGNOSIS — R338 Other retention of urine: Secondary | ICD-10-CM | POA: Diagnosis not present

## 2016-08-02 DIAGNOSIS — N139 Obstructive and reflux uropathy, unspecified: Secondary | ICD-10-CM | POA: Diagnosis not present

## 2016-08-02 DIAGNOSIS — L89151 Pressure ulcer of sacral region, stage 1: Secondary | ICD-10-CM | POA: Diagnosis not present

## 2016-08-02 DIAGNOSIS — Z466 Encounter for fitting and adjustment of urinary device: Secondary | ICD-10-CM | POA: Diagnosis not present

## 2016-08-02 DIAGNOSIS — N179 Acute kidney failure, unspecified: Secondary | ICD-10-CM | POA: Diagnosis not present

## 2016-08-02 DIAGNOSIS — N401 Enlarged prostate with lower urinary tract symptoms: Secondary | ICD-10-CM | POA: Diagnosis not present

## 2016-08-02 LAB — HEMOGLOBIN A1C
HEMOGLOBIN A1C: 5.9 % — AB (ref 4.8–5.6)
MEAN PLASMA GLUCOSE: 123 mg/dL

## 2016-08-05 ENCOUNTER — Encounter (HOSPITAL_COMMUNITY)
Admission: RE | Disposition: A | Discharge status: Home / Self Care | Payer: Self-pay | Source: Ambulatory Visit | Attending: Urology

## 2016-08-05 ENCOUNTER — Encounter (HOSPITAL_COMMUNITY): Payer: Self-pay | Admitting: *Deleted

## 2016-08-05 ENCOUNTER — Ambulatory Visit (HOSPITAL_COMMUNITY)
Admission: RE | Admit: 2016-08-05 | Discharge: 2016-08-05 | Disposition: A | Discharge status: Home / Self Care | Payer: Medicare Other | Source: Ambulatory Visit | Attending: Urology | Admitting: Urology

## 2016-08-05 ENCOUNTER — Ambulatory Visit (HOSPITAL_COMMUNITY): Payer: Medicare Other | Admitting: Anesthesiology

## 2016-08-05 DIAGNOSIS — K219 Gastro-esophageal reflux disease without esophagitis: Secondary | ICD-10-CM | POA: Diagnosis not present

## 2016-08-05 DIAGNOSIS — N368 Other specified disorders of urethra: Secondary | ICD-10-CM | POA: Diagnosis not present

## 2016-08-05 DIAGNOSIS — N401 Enlarged prostate with lower urinary tract symptoms: Secondary | ICD-10-CM | POA: Insufficient documentation

## 2016-08-05 DIAGNOSIS — B3749 Other urogenital candidiasis: Secondary | ICD-10-CM | POA: Diagnosis not present

## 2016-08-05 DIAGNOSIS — E139 Other specified diabetes mellitus without complications: Secondary | ICD-10-CM | POA: Diagnosis not present

## 2016-08-05 DIAGNOSIS — Z86718 Personal history of other venous thrombosis and embolism: Secondary | ICD-10-CM | POA: Insufficient documentation

## 2016-08-05 DIAGNOSIS — Z87891 Personal history of nicotine dependence: Secondary | ICD-10-CM | POA: Diagnosis not present

## 2016-08-05 DIAGNOSIS — N360 Urethral fistula: Secondary | ICD-10-CM | POA: Diagnosis not present

## 2016-08-05 DIAGNOSIS — R339 Retention of urine, unspecified: Secondary | ICD-10-CM | POA: Insufficient documentation

## 2016-08-05 DIAGNOSIS — Z7982 Long term (current) use of aspirin: Secondary | ICD-10-CM | POA: Diagnosis not present

## 2016-08-05 DIAGNOSIS — N320 Bladder-neck obstruction: Secondary | ICD-10-CM

## 2016-08-05 DIAGNOSIS — I2510 Atherosclerotic heart disease of native coronary artery without angina pectoris: Secondary | ICD-10-CM | POA: Insufficient documentation

## 2016-08-05 DIAGNOSIS — I252 Old myocardial infarction: Secondary | ICD-10-CM | POA: Diagnosis not present

## 2016-08-05 DIAGNOSIS — N138 Other obstructive and reflux uropathy: Secondary | ICD-10-CM | POA: Insufficient documentation

## 2016-08-05 DIAGNOSIS — E119 Type 2 diabetes mellitus without complications: Secondary | ICD-10-CM | POA: Insufficient documentation

## 2016-08-05 DIAGNOSIS — R338 Other retention of urine: Secondary | ICD-10-CM | POA: Diagnosis not present

## 2016-08-05 DIAGNOSIS — Z79899 Other long term (current) drug therapy: Secondary | ICD-10-CM | POA: Diagnosis not present

## 2016-08-05 DIAGNOSIS — Z951 Presence of aortocoronary bypass graft: Secondary | ICD-10-CM | POA: Insufficient documentation

## 2016-08-05 DIAGNOSIS — E782 Mixed hyperlipidemia: Secondary | ICD-10-CM | POA: Diagnosis not present

## 2016-08-05 HISTORY — PX: INSERTION OF SUPRAPUBIC CATHETER: SHX5870

## 2016-08-05 LAB — GLUCOSE, CAPILLARY
GLUCOSE-CAPILLARY: 87 mg/dL (ref 65–99)
GLUCOSE-CAPILLARY: 93 mg/dL (ref 65–99)

## 2016-08-05 SURGERY — INSERTION, SUPRAPUBIC CATHETER
Anesthesia: Monitor Anesthesia Care

## 2016-08-05 MED ORDER — PROPOFOL 500 MG/50ML IV EMUL
INTRAVENOUS | Status: DC | PRN
  Administered 2016-08-05 (×2): 25 ug/kg/min via INTRAVENOUS

## 2016-08-05 MED ORDER — LACTATED RINGERS IV SOLN
INTRAVENOUS | Status: DC | PRN
  Administered 2016-08-05: 12:00:00 via INTRAVENOUS

## 2016-08-05 MED ORDER — ONDANSETRON HCL 4 MG/2ML IJ SOLN
INTRAMUSCULAR | Status: AC
  Filled 2016-08-05: qty 2

## 2016-08-05 MED ORDER — PROPOFOL 10 MG/ML IV BOLUS
INTRAVENOUS | Status: AC
  Filled 2016-08-05: qty 20

## 2016-08-05 MED ORDER — LIDOCAINE-EPINEPHRINE 1 %-1:100000 IJ SOLN
INTRAMUSCULAR | Status: DC | PRN
  Administered 2016-08-05: 10 mL

## 2016-08-05 MED ORDER — ONDANSETRON HCL 4 MG/2ML IJ SOLN
INTRAMUSCULAR | Status: DC | PRN
  Administered 2016-08-05: 4 mg via INTRAVENOUS

## 2016-08-05 MED ORDER — PROMETHAZINE HCL 25 MG/ML IJ SOLN
6.2500 mg | INTRAMUSCULAR | Status: DC | PRN
Start: 2016-08-05 — End: 2016-08-05

## 2016-08-05 MED ORDER — CEFAZOLIN SODIUM-DEXTROSE 2-4 GM/100ML-% IV SOLN
INTRAVENOUS | Status: AC
Start: 2016-08-05 — End: 2016-08-05
  Filled 2016-08-05: qty 100

## 2016-08-05 MED ORDER — FENTANYL CITRATE (PF) 100 MCG/2ML IJ SOLN
INTRAMUSCULAR | Status: AC
  Filled 2016-08-05: qty 2

## 2016-08-05 MED ORDER — FENTANYL CITRATE (PF) 100 MCG/2ML IJ SOLN
INTRAMUSCULAR | Status: DC | PRN
  Administered 2016-08-05 (×2): 25 ug via INTRAVENOUS

## 2016-08-05 MED ORDER — BUPIVACAINE HCL (PF) 0.5 % IJ SOLN
INTRAMUSCULAR | Status: AC
  Filled 2016-08-05: qty 30

## 2016-08-05 MED ORDER — NYSTATIN 100000 UNIT/GM EX CREA
TOPICAL_CREAM | Freq: Three times a day (TID) | CUTANEOUS | Status: DC
Start: 2016-08-05 — End: 2016-08-05
  Filled 2016-08-05: qty 15

## 2016-08-05 MED ORDER — CEFAZOLIN SODIUM-DEXTROSE 2-4 GM/100ML-% IV SOLN
2.0000 g | INTRAVENOUS | Status: AC
  Administered 2016-08-05: 2 g via INTRAVENOUS
  Filled 2016-08-05: qty 100

## 2016-08-05 MED ORDER — KETOROLAC TROMETHAMINE 30 MG/ML IJ SOLN: 15.0000 mg | Freq: Once | INTRAMUSCULAR | Status: DC | PRN

## 2016-08-05 MED ORDER — LIDOCAINE-EPINEPHRINE 1 %-1:100000 IJ SOLN
INTRAMUSCULAR | Status: AC
  Filled 2016-08-05: qty 1

## 2016-08-05 MED ORDER — STERILE WATER FOR IRRIGATION IR SOLN
Status: DC | PRN
  Administered 2016-08-05: 1000 mL

## 2016-08-05 MED ORDER — PROPOFOL 500 MG/50ML IV EMUL: INTRAVENOUS | Status: DC | PRN

## 2016-08-05 MED ORDER — LIDOCAINE 2% (20 MG/ML) 5 ML SYRINGE
INTRAMUSCULAR | Status: AC
  Filled 2016-08-05: qty 5

## 2016-08-05 MED ORDER — FENTANYL CITRATE (PF) 100 MCG/2ML IJ SOLN: 25.0000 ug | INTRAMUSCULAR | Status: DC | PRN

## 2016-08-05 SURGICAL SUPPLY — 26 items
BAG URINE DRAINAGE (UROLOGICAL SUPPLIES) ×3 IMPLANT
BAG URINE LEG 500ML (DRAIN) ×1 IMPLANT
BLADE SURG 15 STRL LF DISP TIS (BLADE) ×1 IMPLANT
BLADE SURG 15 STRL SS (BLADE) ×3
CATH BONANNO SUPRAPUBIC 14G (CATHETERS) ×3 IMPLANT
CATH SILASTIC FOLEY 18FRX5CC (CATHETERS) ×2 IMPLANT
COUNTER NEEDLE 20 DBL MAG RED (NEEDLE) ×3 IMPLANT
ELECT PENCIL ROCKER SW 15FT (MISCELLANEOUS) IMPLANT
ELECT REM PT RETURN 9FT ADLT (ELECTROSURGICAL) ×3
ELECTRODE REM PT RTRN 9FT ADLT (ELECTROSURGICAL) ×1 IMPLANT
GAUZE SPONGE 4X4 12PLY STRL (GAUZE/BANDAGES/DRESSINGS) ×2 IMPLANT
GAUZE SPONGE 4X4 16PLY XRAY LF (GAUZE/BANDAGES/DRESSINGS) ×3 IMPLANT
GLOVE BIO SURGEON STRL SZ7.5 (GLOVE) ×3 IMPLANT
GOWN STRL REUS W/TWL XL LVL3 (GOWN DISPOSABLE) ×6 IMPLANT
MANIFOLD NEPTUNE II (INSTRUMENTS) ×3 IMPLANT
NDL SPNL 22GX7 QUINCKE BK (NEEDLE) IMPLANT
NEEDLE HYPO 22GX1.5 SAFETY (NEEDLE) IMPLANT
NEEDLE SPNL 22GX7 QUINCKE BK (NEEDLE) ×3 IMPLANT
NS IRRIG 1000ML POUR BTL (IV SOLUTION) ×3 IMPLANT
PACK CYSTO (CUSTOM PROCEDURE TRAY) ×3 IMPLANT
PLUG CATH AND CAP STER (CATHETERS) IMPLANT
SUT ETHILON 3 0 PS 1 (SUTURE) ×3 IMPLANT
SYR 20CC LL (SYRINGE) ×3 IMPLANT
SYRINGE 10CC LL (SYRINGE) ×3 IMPLANT
TOWEL OR 17X26 10 PK STRL BLUE (TOWEL DISPOSABLE) ×3 IMPLANT
WATER STERILE IRR 3000ML UROMA (IV SOLUTION) ×3 IMPLANT

## 2016-08-05 NOTE — Discharge Instructions (Addendum)
Suprapubic Catheter Replacement, Care After Refer to this sheet in the next few weeks. These instructions provide you with information on caring for yourself after changing your catheter. Your caregiver may also give you more specific instructions. Call your caregiver if you have any problems or questions after you change your catheter. HOME CARE INSTRUCTIONS   Take all medicines prescribed by your caregiver. Follow the directions carefully.  Drink 8 glasses of water every day. This produces good urine flow.  Check the skin around your catheter a few times every day. Watch for redness and swelling. Look for any fluids coming out of the opening.  Do not use powder or cream around the catheter opening.  Do not take tub baths or use pools or hot tubs.  Keep all follow-up appointments. SEEK MEDICAL CARE IF:   You leak urine.  Your skin around the catheter becomes red or sore.  Your urine flow slows down.  Your urine gets cloudy or smelly. SEEK IMMEDIATE MEDICAL CARE IF:   You have chills, nausea, or back pain.  You have trouble changing your catheter.  Your catheter comes out.  You have blood in your urine.  You have no urine flow for 1 hour.  You have a fever.   This information is not intended to replace advice given to you by your health care provider. Make sure you discuss any questions you have with your health care provider.   Document Released: 07/02/2011 Document Revised: 11/04/2014 Document Reviewed: 07/02/2011 Elsevier Interactive Patient Education [employer].    I have reviewed discharge instructions in detail with the patient. They will follow-up with me or their physician as scheduled. My nurse will also be calling the patients as per protocol.    TREATMENT FOR PERINEAL YEAST RASH: Please do not use adult diapers on this patient. The perineum needs air flow to prevent further yeast infections.   Wash perineum BID with antibacterial soap (ie Dial  brand), dry thoroughly and apply a thin layer of antifungal powder. Please use InterDry in skin folds as instructed on attached sheet. Each piece may be used for 5 days at a time before being discarded.     This patient would benefit from oral Diflucan - please address with his primary care physician.     General Anesthesia, Adult, Care After Refer to this sheet in the next few weeks. These instructions provide you with information on caring for yourself after your procedure. Your health care provider may also give you more specific instructions. Your treatment has been planned according to current medical practices, but problems sometimes occur. Call your health care provider if you have any problems or questions after your procedure. WHAT TO EXPECT AFTER THE PROCEDURE After the procedure, it is typical to experience:  Sleepiness.  Nausea and vomiting. HOME CARE INSTRUCTIONS  For the first 24 hours after general anesthesia:  Have a responsible person with you.  Do not drive a car. If you are alone, do not take public transportation.  Do not drink alcohol.  Do not take medicine that has not been prescribed by your health care provider.  Do not sign important papers or make important decisions.  You may resume a normal diet and activities as directed by your health care provider.  Change bandages (dressings) as directed.  If you have questions or problems that seem related to general anesthesia, call the hospital and ask for the anesthetist or anesthesiologist on call. SEEK MEDICAL CARE IF:  You have  nausea and vomiting that continue the day after anesthesia.  You develop a rash. SEEK IMMEDIATE MEDICAL CARE IF:   You have difficulty breathing.  You have chest pain.  You have any allergic problems.   This information is not intended to replace advice given to you by your health care provider. Make sure you discuss any questions you have with your health care provider.     Document Released: 01/20/2001 Document Revised: 11/04/2014 Document Reviewed: 02/12/2012 Elsevier Interactive Patient Education [employer].

## 2016-08-05 NOTE — Anesthesia Preprocedure Evaluation (Signed)
Anesthesia Evaluation  Patient identified by MRN, date of birth, ID band Patient awake    Reviewed: Allergy & Precautions, NPO status , Patient's Chart, lab work & pertinent test results  Airway Mallampati: II  TM Distance: >3 FB Neck ROM: Full    Dental no notable dental hx.    Pulmonary neg pulmonary ROS, former smoker,    Pulmonary exam normal breath sounds clear to auscultation       Cardiovascular + CAD, + Past MI and + CABG  Normal cardiovascular exam Rhythm:Regular Rate:Normal     Neuro/Psych negative neurological ROS  negative psych ROS   GI/Hepatic negative GI ROS, Neg liver ROS,   Endo/Other  diabetes  Renal/GU negative Renal ROS  negative genitourinary   Musculoskeletal negative musculoskeletal ROS (+)   Abdominal   Peds negative pediatric ROS (+)  Hematology negative hematology ROS (+)   Anesthesia Other Findings   Reproductive/Obstetrics negative OB ROS                             Anesthesia Physical Anesthesia Plan  ASA: III  Anesthesia Plan: MAC   Post-op Pain Management:    Induction: Intravenous  Airway Management Planned:   Additional Equipment:   Intra-op Plan:   Post-operative Plan:   Informed Consent: I have reviewed the patients History and Physical, chart, labs and discussed the procedure including the risks, benefits and alternatives for the proposed anesthesia with the patient or authorized representative who has indicated his/her understanding and acceptance.   Dental advisory given  Plan Discussed with: CRNA and Surgeon  Anesthesia Plan Comments: (MAC with ketamine for last suprapubic tube placement)        Anesthesia Quick Evaluation

## 2016-08-05 NOTE — Anesthesia Postprocedure Evaluation (Signed)
Anesthesia Post Note  Patient: Ryan Pacheco  Procedure(s) Performed: Procedure(s) (LRB): CYSTOSCOPY AND INSERTION OF SUPRAPUBIC CATHETER (N/A)  Patient location during evaluation: PACU Anesthesia Type: MAC Level of consciousness: awake and alert Pain management: pain level controlled Vital Signs Assessment: post-procedure vital signs reviewed and stable Respiratory status: spontaneous breathing, nonlabored ventilation, respiratory function stable and patient connected to nasal cannula oxygen Cardiovascular status: stable and blood pressure returned to baseline Anesthetic complications: no    Last Vitals:  Vitals:   08/05/16 1345 08/05/16 1400  BP: 137/62 140/68  Pulse: (!) 49 (!) 57  Resp: 11 11  Temp:      Last Pain:  Vitals:   08/05/16 1330  TempSrc:   PainSc: Asleep                 Reginal Lutes

## 2016-08-05 NOTE — Progress Notes (Addendum)
Called report to [place] with [facility] at [facility]. Had to leave voice mail.  Skin care product used on perineum to wick moisture away from perineum. Patient is not to use diapers. Asked RN at [facility] to follow up with primary MD to request diflucan for fungal infections.

## 2016-08-05 NOTE — Progress Notes (Signed)
Skin care product used next to skin was interdry.

## 2016-08-05 NOTE — Transfer of Care (Signed)
Immediate Anesthesia Transfer of Care Note  Patient: Ryan Pacheco  Procedure(s) Performed: Procedure(s): CYSTOSCOPY AND INSERTION OF SUPRAPUBIC CATHETER (N/A)  Patient Location: PACU  Anesthesia Type:MAC  Level of Consciousness: awake  Airway & Oxygen Therapy: Patient Spontanous Breathing and Patient connected to face mask oxygen  Post-op Assessment: Report given to RN  Post vital signs: Reviewed and stable  Last Vitals: 135/66, 55, 19, 100% Vitals:   08/05/16 0818  BP: (!) 173/73  Pulse: (!) 52  Resp: 18  Temp: 36.8 C    Last Pain:  Vitals:   08/05/16 0818  TempSrc: Oral      Patients Stated Pain Goal: 3 ([id number])  Complications: No apparent anesthesia complications

## 2016-08-05 NOTE — Anesthesia Procedure Notes (Signed)
Procedure Name: MAC Date/Time: 08/05/2016 12:00 PM Performed by: Bonney Aid Pre-anesthesia Checklist: Patient identified, Emergency Drugs available, Suction available, Patient being monitored and Timeout performed Patient Re-evaluated:Patient Re-evaluated prior to inductionOxygen Delivery Method: Simple face mask Placement Confirmation: positive ETCO2

## 2016-08-05 NOTE — Interval H&P Note (Signed)
History and Physical Interval Note:  08/05/2016 11:45 AM  Ryan Pacheco  has presented today for surgery, with the diagnosis of chronic urinary retention  The various methods of treatment have been discussed with the patient and family. After consideration of risks, benefits and other options for treatment, the patient has consented to  Procedure(s): CYSTOSCOPY AND INSERTION OF SUPRAPUBIC CATHETER (N/A) as a surgical intervention .  The patient's history has been reviewed, patient examined, no change in status, stable for surgery.  I have reviewed the patient's chart and labs.  Questions were answered to the patient's satisfaction.     Teona Vargus I Makhya Arave  Pt seen and examined. Both pt and daughter Onalee Hua that he is having s-p tube placed.

## 2016-08-05 NOTE — H&P (Signed)
Office Visit Report     06/12/2016   --------------------------------------------------------------------------------   Ryan Pacheco. Ryan Pacheco  MRN: [id number]  PRIMARY CARE:  Audree Camel. Alyson Ingles, MD  DOB: October 06, 1920, 82 year old Male  REFERRING:  Robert A. Alyson Ingles, Merlin  PROVIDER:  Carolan Clines, M.D.    LOCATION:  [facility] [phone]   --------------------------------------------------------------------------------   CC: I have urinary retention.  HPI: Ryan Pacheco is a 82 year-old male established patient who is here for urinary retention.  His problem was diagnosed approximately 05/30/2016. His current symptoms did not begin after he had a surgical procedure. His urinary retention is being treated with foley catheter. Patient denies suprapubic tube, intemittent catheterization, flomax, hytrin, cardura, uroxatrol, rapaflo, avodart, and proscar.   He does have an abnormal sensation when needing to urinate. He does have to strain or bear down to start his urinary stream. He does have a good size and strength to his urinary stream. He is having problems with emptying his bladder well. His urine has shut off completely.   He is having problems with urinary control or incontinence.   He has previously had an indwelling catheter in for more than two weeks at a time.   Here to discuss suprapubic foley placement.  patient lives at [place] [facility] home. Seen today with his daughter (healthcare power of attorney). No notes from nursing staff nursing home. Patient developed acute urinary retention with creatinine of 8, with Foley catheter placed. He denies voiding symptoms.   Patient was completely independent living until 6 months ago, living by himself and caring for himself. However, the last 6 months, he has progressed to nursing home status. He has a history of recurrent acute urinary retention.     CC: BPH  HPI: The patient states his most bothersome symptom(s)  are the following: nocturia. Patient is currently treated with none for his symptoms.    2013: IPSS= 35, with severe urgency, frequency, nocturia, inability to void. Patient had Foley catheter placed, and then suprapubic tube placed. Eventually began to void spontaneously, and suprapubic tube was removed. He is now developed recurrent urinary retention with Foley catheter replaced, with creatinine of 8.0. He is now 82 years old, and living in [place]. His wife has passed away, and his healthcare power of attorney is his daughter, lives in [place]. He is seen today with his daughter and son-in-law.     AUA Symptom Score: He never has the sensation of not emptying his bladder completely after finishing urinating. He never has to urinate again less that two hours after he has finished urinating. He does not have to stop and start again several times when he urinates. He never finds it difficult to postpone urination. He never has a weak urinary stream. He never has to push or strain to begin urination. He never has to get up to urinate from the time he goes to bed until the time he gets up in the morning.   Calculated AUA Symptom Score: 0    QOL Score: He would feel mixed if he had to live with his urinary condition the way it is now for the rest of his life.   Calculated QOL Symptom Score: 3    ALLERGIES: No Allergies    MEDICATIONS: Aspirin 325 MG Oral Tablet Oral  Atorvastatin Calcium 20 MG Oral Tablet Oral  Calcium 600 + D TABS Oral  Ferrous Sulfate 325 MG CAPS Oral  Lasix 40 MG Oral Tablet  Oral  Multi-Vitamin Oral Tablet Oral  Pantoprazole Sodium 40 MG Oral Tablet Delayed Release Oral  Potassium Chloride 20 MEQ TBCR Oral     GU PSH: Open SP Tube Placement - February 05, 2012      PSH Notes: Bladder Cystotomy With Drainage, CABG (CABG), Cholecystectomy   NON-GU PSH: Cholecystectomy - February 05, 2012 Coronary Artery Bypass Grafting (cabg) - 02/05/12    GU PMH: BPH w/LUTS, Benign prostatic  hyperplasia with urinary obstruction - 06/20/2015 Elevated PSA, Elevated prostate specific antigen (PSA) - 06/20/2015 Nocturia, Nocturia - 06/20/2015 Urinary Frequency, Urinary frequency - 06/20/2015 Urinary Urgency, Urinary urgency - 06/20/2015 Acute Cystitis, Acute cystitis without hematuria - 02/15/2015 Urinary Retention, Unspec, Urinary retention - 02/15/2015 Urinary Tract Inf, Unspec site, Pyuria - 02/15/2015      PMH Notes:  2011-12-17 14:30:18 - Note: Acute Myocardial Infarction  2008-02-08 13:07:05 - Note: Thrombophlebitis Of Deep Vessels Of The Lower Extremity  2011-12-17 14:30:18 - Note: Venous Thrombosis Of The Deep Vessels Of The Distal Lower Extremity  2011-12-17 14:30:18 - Note: Arthritis   NON-GU PMH: Encounter for general adult medical examination without abnormal findings, Encounter for preventive health examination - 02/04/2014 Personal history of other diseases of the digestive system, History of esophageal reflux - 02/04/2013    FAMILY HISTORY: Death In The Family Father - Father Death In The Family Mother - Mother Family Health Status Number - Runs In Family Heart Disease - Father   SOCIAL HISTORY: Marital Status: Widowed Current Smoking Status: Patient has never smoked.  Does not drink anymore.  Does not drink caffeine.     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  Gastrointestinal (Upper):   Patient denies nausea, vomiting, and indigestion/ heartburn.  Gastrointestinal (Lower):   Patient denies diarrhea and constipation.  Constitutional:   Patient denies fever, night sweats, weight loss, and fatigue.  Skin:   Patient denies  skin rash/ lesion and itching.  Eyes:   Patient denies blurred vision and double vision.  Ears/ Nose/ Throat:   Patient denies sore throat and sinus problems.  Hematologic/Lymphatic:   Patient denies swollen glands and easy bruising.  Cardiovascular:   Patient denies leg swelling and chest pains.  Respiratory:   Patient denies cough and shortness of breath.  Endocrine:   Patient denies excessive thirst.  Musculoskeletal:   Patient denies back pain and joint pain.  Neurological:   Patient denies headaches and dizziness.  Psychologic:   Patient denies depression and anxiety.   VITAL SIGNS:      06/12/2016 10:19 AM  BP 126/71 mmHg  Pulse 68 /min  Temperature 97.6 F / 36 C   GU PHYSICAL EXAMINATION:    Penis: Circumcised, no foreskin warts, no cracks. No dorsal peyronie's plaques, no left corporal peyronie's plaques, no right corporal peyronie's plaques, no scarring, no shaft warts. No balanitis, no meatal stenosis. meatal and urethral erosion secondary to Foley catheter placement.   Prostate: Prostate 3 + size. Left lobe normal consistency, right lobe normal consistency. Symmetrical lobes. No prostate nodule. Left lobe no tenderness, right lobe no tenderness.   Sphincter Tone: Sphincter tone is poor. No rectal tenderness. No rectal mass.    MULTI-SYSTEM  PHYSICAL EXAMINATION:    Constitutional: Well-nourished. frail elderly and very hard of hearing. Normally developed. Good grooming.   Musculoskeletal: Misaligned spine, ribs, pelvis, asymmetrical. Normal gait and station of head and neck.     PAST DATA REVIEWED:  Source Of History:  Patient   09/27/08 02/29/04  PSA  Total PSA 4.81  2.97   Free PSA 1.11    % Free PSA 23.1      06/20/15 02/15/15 02/08/14 01/20/13 07/21/12 05/22/12 09/27/08  Urinalysis  Urine Appearance CLEAR         Urine Bilirubin NEGATIVE         Urine Ketones NEGATIVE         Urine Specific Gravity 1.020         Urine Blood NEGATIVE         Urine pH 5.5          Urine Protein NEGATIVE         Urine Nitrites NEGATIVE         Urine Leukocyte Esterase NEGATIVE         Urine WBC/hpf  TNTC  0-2  0-2  TNTC  TNTC    Urine RBC/hpf  3-6  0-2  NONE SEEN  0-2  NONE SEEN  FEW (4-15)    PROCEDURES: None   ASSESSMENT:      ICD-10 Details  1 GU:   BPH w/LUTS - N40.1   2   Urinary Retention - R33.8   3   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 We have discussed the pros and cons of having another S-P tube placed. He will need some anesthesia for tube placement. He already has urethral erosion from the small Foley catheter that is in place now. This will continue if we leave the catheter in place. I believe he should go ahead and have the suprapubic tube placed, which can be changed once per month. His daughter and son-in-law are in agreement. I believe the patient is also in agreement.    PLAN:           Schedule Return Visit: 6-8 Weeks - Schedule Surgery  Return Notes: cc: Dr. Maury Dus          Document Letter(s):  Created for Patient: Clinical Summary         Notes:   schedule suprapubic catheter with removal of Foley catheter under anesthesia     The information contained in this medical record document is considered private and confidential patient information. This information can only be used for the medical diagnosis and/or medical services that are being provided by the patient's selected caregivers. This information can only be distributed outside of the patient's care if the patient agrees and signs waivers of authorization for this information to be sent to an outside source or route.

## 2016-08-05 NOTE — Op Note (Signed)
Pre-operative diagnosis :   Chronic urinary retention, Urethral erosion  Postoperative diagnosis:  Same plus genitourinary yeast infection of medial thigh, bilateral.  Operation: cystoscopy, placement of suprapubic catherter  Surgeon:  S. Gaynelle Arabian, MD  First assistant: None  Anesthesia:  MAC/Propathol  Preparation: After appropriate preanesthesia, the patient was brought to the operative room, placed on the operating table in the dorsal supine position where he received IV sedation with propofol anesthesia. The history was reviewed. The armband was double checked.  Review history:  CC: I have urinary retention.  HPI: Ryan Pacheco is a 82 year-old male established patient who is here for urinary retention.  His problem was diagnosed approximately 05/30/2016. His current symptoms did not begin after he had a surgical procedure. His urinary retention is being treated with foley catheter. Patient denies suprapubic tube, intemittent catheterization, flomax, hytrin, cardura, uroxatrol, rapaflo, avodart, and proscar.   He does have an abnormal sensation when needing to urinate. He does have to strain or bear down to start his urinary stream. He does have a good size and strength to his urinary stream. He is having problems with emptying his bladder well. His urine has shut off completely.   He is having problems with urinary control or incontinence.   He has previously had an indwelling catheter in for more than two weeks at a time.   Here to discuss suprapubic foley placement.  patient lives at [facility]. Seen today with his daughter (healthcare power of attorney). No notes from nursing staff nursing home. Patient developed acute urinary retention with creatinine of 8, with Foley catheter placed. He denies voiding symptoms.   Patient was completely independent living until 6 months ago, living by himself and caring for himself. However, the last 6 months, he has  progressed to nursing home status. He has a history of recurrent acute urinary retention.     CC: BPH  HPI: The patient states his most bothersome symptom(s) are the following: nocturia. Patient is currently treated with none for his symptoms.    2013: IPSS= 35, with severe urgency, frequency, nocturia, inability to void. Patient had Foley catheter placed, and then suprapubic tube placed. Eventually began to void spontaneously, and suprapubic tube was removed. He is now developed recurrent urinary retention with Foley catheter replaced, with creatinine of 8.0. He is now 82 years old, and living in [place]. His wife has passed away, and his healthcare power of attorney is his daughter, lives in [place]. He is seen today with his daughter and son-in-law.     AUA Symptom Score: He never has the sensation of not emptying his bladder completely after finishing urinating. He never has to urinate again less that two hours after he has finished urinating. He does not have to stop and start again several times when he urinates. He never finds it difficult to postpone urination. He never has a weak urinary stream. He never has to push or strain to begin urination. He never has to get up to urinate from the time he goes to bed until the time he gets up in the morning.    Statement of  Likelihood of Success: Excellent. TIME-OUT observed.  Procedure:  Penile examination revealed severe ventral urethral erosion secondary to chronic Foley catheterization. In addition, he is seen to have severe bilateral yeast infection, covering his medial thigh bilaterally.  Cystoscopy was accomplished, which showed the Vero to be intact. Prosthetic hypertrophy was noted, with trilobar BPH. The trigone was intact,  and no evidence of bladder stone, tumor, or diverticular formation was identified. The bladder was filled with approximately 400 mL of saline. Prior previously placed suprapubic tube scar was identified in  the midline, halfway between the umbilicus, and the pubis. The patient was placed in slight Trendelenburg position. Using a long spinal needle, a pass was accomplished above the previously placed suprapubic catheter, into the middle of the anterior portion of the bladder. This was marked with a marking pen. 10 mL of quarter percent Marcaine with epinephrine 1-100,000 was then injected through the skin and subcutaneous tissue. Using a 15 blade, a  meter incision is made, and subcutaneous tissue dissected.  The cystoscope was removed, and a Lowsley tractor placed at the point of incision. Cutdowns accomplished on the Lowsley device, and the device is brought into the wound. An 38 French Silastic catheter is then passed antegrade through the bladder and the urethra and released. Cystoscopy was then accomplished for direct vision, following the catheter into the bladder. Once in the bladder, the catheter balloon is inflated with 10 mL of sterile water. A single 3-0 nylon sutures used to secure the catheter suprapubically. Sterile dressings applied. The patient was placed in supine position. He was awakened, and taken to recovery room in good condition. The catheter drained clear fluid.  The patient will have his yeast infection treated in recovery room.

## 2016-08-06 DIAGNOSIS — N179 Acute kidney failure, unspecified: Secondary | ICD-10-CM | POA: Diagnosis not present

## 2016-08-06 DIAGNOSIS — L89151 Pressure ulcer of sacral region, stage 1: Secondary | ICD-10-CM | POA: Diagnosis not present

## 2016-08-06 DIAGNOSIS — R338 Other retention of urine: Secondary | ICD-10-CM | POA: Diagnosis not present

## 2016-08-06 DIAGNOSIS — N401 Enlarged prostate with lower urinary tract symptoms: Secondary | ICD-10-CM | POA: Diagnosis not present

## 2016-08-06 DIAGNOSIS — Z466 Encounter for fitting and adjustment of urinary device: Secondary | ICD-10-CM | POA: Diagnosis not present

## 2016-08-06 DIAGNOSIS — N139 Obstructive and reflux uropathy, unspecified: Secondary | ICD-10-CM | POA: Diagnosis not present

## 2016-08-08 DIAGNOSIS — L89151 Pressure ulcer of sacral region, stage 1: Secondary | ICD-10-CM | POA: Diagnosis not present

## 2016-08-08 DIAGNOSIS — N401 Enlarged prostate with lower urinary tract symptoms: Secondary | ICD-10-CM | POA: Diagnosis not present

## 2016-08-08 DIAGNOSIS — B353 Tinea pedis: Secondary | ICD-10-CM | POA: Diagnosis not present

## 2016-08-08 DIAGNOSIS — N179 Acute kidney failure, unspecified: Secondary | ICD-10-CM | POA: Diagnosis not present

## 2016-08-08 DIAGNOSIS — R338 Other retention of urine: Secondary | ICD-10-CM | POA: Diagnosis not present

## 2016-08-08 DIAGNOSIS — B351 Tinea unguium: Secondary | ICD-10-CM | POA: Diagnosis not present

## 2016-08-08 DIAGNOSIS — N139 Obstructive and reflux uropathy, unspecified: Secondary | ICD-10-CM | POA: Diagnosis not present

## 2016-08-08 DIAGNOSIS — Z466 Encounter for fitting and adjustment of urinary device: Secondary | ICD-10-CM | POA: Diagnosis not present

## 2016-08-12 DIAGNOSIS — L89151 Pressure ulcer of sacral region, stage 1: Secondary | ICD-10-CM | POA: Diagnosis not present

## 2016-08-12 DIAGNOSIS — Z466 Encounter for fitting and adjustment of urinary device: Secondary | ICD-10-CM | POA: Diagnosis not present

## 2016-08-12 DIAGNOSIS — N401 Enlarged prostate with lower urinary tract symptoms: Secondary | ICD-10-CM | POA: Diagnosis not present

## 2016-08-12 DIAGNOSIS — N139 Obstructive and reflux uropathy, unspecified: Secondary | ICD-10-CM | POA: Diagnosis not present

## 2016-08-12 DIAGNOSIS — N179 Acute kidney failure, unspecified: Secondary | ICD-10-CM | POA: Diagnosis not present

## 2016-08-12 DIAGNOSIS — R338 Other retention of urine: Secondary | ICD-10-CM | POA: Diagnosis not present

## 2016-08-13 DIAGNOSIS — R338 Other retention of urine: Secondary | ICD-10-CM | POA: Diagnosis not present

## 2016-08-13 DIAGNOSIS — N139 Obstructive and reflux uropathy, unspecified: Secondary | ICD-10-CM | POA: Diagnosis not present

## 2016-08-13 DIAGNOSIS — N401 Enlarged prostate with lower urinary tract symptoms: Secondary | ICD-10-CM | POA: Diagnosis not present

## 2016-08-13 DIAGNOSIS — N179 Acute kidney failure, unspecified: Secondary | ICD-10-CM | POA: Diagnosis not present

## 2016-08-13 DIAGNOSIS — L89151 Pressure ulcer of sacral region, stage 1: Secondary | ICD-10-CM | POA: Diagnosis not present

## 2016-08-13 DIAGNOSIS — Z466 Encounter for fitting and adjustment of urinary device: Secondary | ICD-10-CM | POA: Diagnosis not present

## 2016-08-15 DIAGNOSIS — N179 Acute kidney failure, unspecified: Secondary | ICD-10-CM | POA: Diagnosis not present

## 2016-08-15 DIAGNOSIS — N139 Obstructive and reflux uropathy, unspecified: Secondary | ICD-10-CM | POA: Diagnosis not present

## 2016-08-15 DIAGNOSIS — L89151 Pressure ulcer of sacral region, stage 1: Secondary | ICD-10-CM | POA: Diagnosis not present

## 2016-08-15 DIAGNOSIS — R338 Other retention of urine: Secondary | ICD-10-CM | POA: Diagnosis not present

## 2016-08-15 DIAGNOSIS — Z466 Encounter for fitting and adjustment of urinary device: Secondary | ICD-10-CM | POA: Diagnosis not present

## 2016-08-15 DIAGNOSIS — N401 Enlarged prostate with lower urinary tract symptoms: Secondary | ICD-10-CM | POA: Diagnosis not present

## 2016-08-22 DIAGNOSIS — R338 Other retention of urine: Secondary | ICD-10-CM | POA: Diagnosis not present

## 2016-08-22 DIAGNOSIS — N401 Enlarged prostate with lower urinary tract symptoms: Secondary | ICD-10-CM | POA: Diagnosis not present

## 2016-08-22 DIAGNOSIS — N179 Acute kidney failure, unspecified: Secondary | ICD-10-CM | POA: Diagnosis not present

## 2016-08-22 DIAGNOSIS — Z466 Encounter for fitting and adjustment of urinary device: Secondary | ICD-10-CM | POA: Diagnosis not present

## 2016-08-22 DIAGNOSIS — L89151 Pressure ulcer of sacral region, stage 1: Secondary | ICD-10-CM | POA: Diagnosis not present

## 2016-08-22 DIAGNOSIS — N139 Obstructive and reflux uropathy, unspecified: Secondary | ICD-10-CM | POA: Diagnosis not present

## 2016-08-23 DIAGNOSIS — N179 Acute kidney failure, unspecified: Secondary | ICD-10-CM | POA: Diagnosis not present

## 2016-08-23 DIAGNOSIS — R338 Other retention of urine: Secondary | ICD-10-CM | POA: Diagnosis not present

## 2016-08-23 DIAGNOSIS — Z466 Encounter for fitting and adjustment of urinary device: Secondary | ICD-10-CM | POA: Diagnosis not present

## 2016-08-23 DIAGNOSIS — N139 Obstructive and reflux uropathy, unspecified: Secondary | ICD-10-CM | POA: Diagnosis not present

## 2016-08-23 DIAGNOSIS — L89151 Pressure ulcer of sacral region, stage 1: Secondary | ICD-10-CM | POA: Diagnosis not present

## 2016-08-23 DIAGNOSIS — N401 Enlarged prostate with lower urinary tract symptoms: Secondary | ICD-10-CM | POA: Diagnosis not present

## 2016-09-16 DIAGNOSIS — R634 Abnormal weight loss: Secondary | ICD-10-CM | POA: Diagnosis not present

## 2016-09-16 DIAGNOSIS — R600 Localized edema: Secondary | ICD-10-CM | POA: Diagnosis not present

## 2016-09-16 DIAGNOSIS — B356 Tinea cruris: Secondary | ICD-10-CM | POA: Diagnosis not present

## 2016-09-20 DIAGNOSIS — R296 Repeated falls: Secondary | ICD-10-CM | POA: Diagnosis not present

## 2016-09-20 DIAGNOSIS — Z435 Encounter for attention to cystostomy: Secondary | ICD-10-CM | POA: Diagnosis not present

## 2016-09-20 DIAGNOSIS — R600 Localized edema: Secondary | ICD-10-CM | POA: Diagnosis not present

## 2016-09-20 DIAGNOSIS — R634 Abnormal weight loss: Secondary | ICD-10-CM | POA: Diagnosis not present

## 2016-09-20 DIAGNOSIS — Z86718 Personal history of other venous thrombosis and embolism: Secondary | ICD-10-CM | POA: Diagnosis not present

## 2016-09-20 DIAGNOSIS — R339 Retention of urine, unspecified: Secondary | ICD-10-CM | POA: Diagnosis not present

## 2016-09-23 DIAGNOSIS — R339 Retention of urine, unspecified: Secondary | ICD-10-CM | POA: Diagnosis not present

## 2016-09-23 DIAGNOSIS — Z86718 Personal history of other venous thrombosis and embolism: Secondary | ICD-10-CM | POA: Diagnosis not present

## 2016-09-23 DIAGNOSIS — R634 Abnormal weight loss: Secondary | ICD-10-CM | POA: Diagnosis not present

## 2016-09-23 DIAGNOSIS — R296 Repeated falls: Secondary | ICD-10-CM | POA: Diagnosis not present

## 2016-09-23 DIAGNOSIS — Z435 Encounter for attention to cystostomy: Secondary | ICD-10-CM | POA: Diagnosis not present

## 2016-09-23 DIAGNOSIS — R600 Localized edema: Secondary | ICD-10-CM | POA: Diagnosis not present

## 2016-09-26 DIAGNOSIS — R600 Localized edema: Secondary | ICD-10-CM | POA: Diagnosis not present

## 2016-09-26 DIAGNOSIS — Z435 Encounter for attention to cystostomy: Secondary | ICD-10-CM | POA: Diagnosis not present

## 2016-09-26 DIAGNOSIS — Z86718 Personal history of other venous thrombosis and embolism: Secondary | ICD-10-CM | POA: Diagnosis not present

## 2016-09-26 DIAGNOSIS — R296 Repeated falls: Secondary | ICD-10-CM | POA: Diagnosis not present

## 2016-09-26 DIAGNOSIS — R339 Retention of urine, unspecified: Secondary | ICD-10-CM | POA: Diagnosis not present

## 2016-09-26 DIAGNOSIS — R634 Abnormal weight loss: Secondary | ICD-10-CM | POA: Diagnosis not present

## 2016-09-30 DIAGNOSIS — R296 Repeated falls: Secondary | ICD-10-CM | POA: Diagnosis not present

## 2016-09-30 DIAGNOSIS — R339 Retention of urine, unspecified: Secondary | ICD-10-CM | POA: Diagnosis not present

## 2016-09-30 DIAGNOSIS — R600 Localized edema: Secondary | ICD-10-CM | POA: Diagnosis not present

## 2016-09-30 DIAGNOSIS — Z435 Encounter for attention to cystostomy: Secondary | ICD-10-CM | POA: Diagnosis not present

## 2016-09-30 DIAGNOSIS — R634 Abnormal weight loss: Secondary | ICD-10-CM | POA: Diagnosis not present

## 2016-09-30 DIAGNOSIS — Z86718 Personal history of other venous thrombosis and embolism: Secondary | ICD-10-CM | POA: Diagnosis not present

## 2016-10-02 DIAGNOSIS — R600 Localized edema: Secondary | ICD-10-CM | POA: Diagnosis not present

## 2016-10-02 DIAGNOSIS — R339 Retention of urine, unspecified: Secondary | ICD-10-CM | POA: Diagnosis not present

## 2016-10-02 DIAGNOSIS — Z86718 Personal history of other venous thrombosis and embolism: Secondary | ICD-10-CM | POA: Diagnosis not present

## 2016-10-02 DIAGNOSIS — R296 Repeated falls: Secondary | ICD-10-CM | POA: Diagnosis not present

## 2016-10-02 DIAGNOSIS — Z435 Encounter for attention to cystostomy: Secondary | ICD-10-CM | POA: Diagnosis not present

## 2016-10-02 DIAGNOSIS — R634 Abnormal weight loss: Secondary | ICD-10-CM | POA: Diagnosis not present

## 2016-10-07 DIAGNOSIS — Z435 Encounter for attention to cystostomy: Secondary | ICD-10-CM | POA: Diagnosis not present

## 2016-10-07 DIAGNOSIS — R600 Localized edema: Secondary | ICD-10-CM | POA: Diagnosis not present

## 2016-10-07 DIAGNOSIS — R634 Abnormal weight loss: Secondary | ICD-10-CM | POA: Diagnosis not present

## 2016-10-07 DIAGNOSIS — R296 Repeated falls: Secondary | ICD-10-CM | POA: Diagnosis not present

## 2016-10-07 DIAGNOSIS — R339 Retention of urine, unspecified: Secondary | ICD-10-CM | POA: Diagnosis not present

## 2016-10-07 DIAGNOSIS — Z86718 Personal history of other venous thrombosis and embolism: Secondary | ICD-10-CM | POA: Diagnosis not present

## 2016-10-09 DIAGNOSIS — R296 Repeated falls: Secondary | ICD-10-CM | POA: Diagnosis not present

## 2016-10-09 DIAGNOSIS — R634 Abnormal weight loss: Secondary | ICD-10-CM | POA: Diagnosis not present

## 2016-10-09 DIAGNOSIS — R339 Retention of urine, unspecified: Secondary | ICD-10-CM | POA: Diagnosis not present

## 2016-10-09 DIAGNOSIS — Z86718 Personal history of other venous thrombosis and embolism: Secondary | ICD-10-CM | POA: Diagnosis not present

## 2016-10-09 DIAGNOSIS — Z435 Encounter for attention to cystostomy: Secondary | ICD-10-CM | POA: Diagnosis not present

## 2016-10-09 DIAGNOSIS — R600 Localized edema: Secondary | ICD-10-CM | POA: Diagnosis not present

## 2016-10-10 DIAGNOSIS — R296 Repeated falls: Secondary | ICD-10-CM | POA: Diagnosis not present

## 2016-10-10 DIAGNOSIS — Z435 Encounter for attention to cystostomy: Secondary | ICD-10-CM | POA: Diagnosis not present

## 2016-10-10 DIAGNOSIS — R600 Localized edema: Secondary | ICD-10-CM | POA: Diagnosis not present

## 2016-10-10 DIAGNOSIS — R634 Abnormal weight loss: Secondary | ICD-10-CM | POA: Diagnosis not present

## 2016-10-10 DIAGNOSIS — Z86718 Personal history of other venous thrombosis and embolism: Secondary | ICD-10-CM | POA: Diagnosis not present

## 2016-10-10 DIAGNOSIS — R339 Retention of urine, unspecified: Secondary | ICD-10-CM | POA: Diagnosis not present

## 2016-10-14 DIAGNOSIS — R600 Localized edema: Secondary | ICD-10-CM | POA: Diagnosis not present

## 2016-10-14 DIAGNOSIS — Z435 Encounter for attention to cystostomy: Secondary | ICD-10-CM | POA: Diagnosis not present

## 2016-10-14 DIAGNOSIS — Z86718 Personal history of other venous thrombosis and embolism: Secondary | ICD-10-CM | POA: Diagnosis not present

## 2016-10-14 DIAGNOSIS — R634 Abnormal weight loss: Secondary | ICD-10-CM | POA: Diagnosis not present

## 2016-10-14 DIAGNOSIS — R339 Retention of urine, unspecified: Secondary | ICD-10-CM | POA: Diagnosis not present

## 2016-10-14 DIAGNOSIS — R296 Repeated falls: Secondary | ICD-10-CM | POA: Diagnosis not present

## 2016-10-15 DIAGNOSIS — R634 Abnormal weight loss: Secondary | ICD-10-CM | POA: Diagnosis not present

## 2016-10-15 DIAGNOSIS — R296 Repeated falls: Secondary | ICD-10-CM | POA: Diagnosis not present

## 2016-10-15 DIAGNOSIS — Z86718 Personal history of other venous thrombosis and embolism: Secondary | ICD-10-CM | POA: Diagnosis not present

## 2016-10-15 DIAGNOSIS — Z435 Encounter for attention to cystostomy: Secondary | ICD-10-CM | POA: Diagnosis not present

## 2016-10-15 DIAGNOSIS — R600 Localized edema: Secondary | ICD-10-CM | POA: Diagnosis not present

## 2016-10-15 DIAGNOSIS — R339 Retention of urine, unspecified: Secondary | ICD-10-CM | POA: Diagnosis not present

## 2016-10-17 DIAGNOSIS — R296 Repeated falls: Secondary | ICD-10-CM | POA: Diagnosis not present

## 2016-10-17 DIAGNOSIS — Z86718 Personal history of other venous thrombosis and embolism: Secondary | ICD-10-CM | POA: Diagnosis not present

## 2016-10-17 DIAGNOSIS — R339 Retention of urine, unspecified: Secondary | ICD-10-CM | POA: Diagnosis not present

## 2016-10-17 DIAGNOSIS — R600 Localized edema: Secondary | ICD-10-CM | POA: Diagnosis not present

## 2016-10-17 DIAGNOSIS — R634 Abnormal weight loss: Secondary | ICD-10-CM | POA: Diagnosis not present

## 2016-10-17 DIAGNOSIS — Z435 Encounter for attention to cystostomy: Secondary | ICD-10-CM | POA: Diagnosis not present

## 2016-10-18 DIAGNOSIS — R600 Localized edema: Secondary | ICD-10-CM | POA: Diagnosis not present

## 2016-10-18 DIAGNOSIS — Z86718 Personal history of other venous thrombosis and embolism: Secondary | ICD-10-CM | POA: Diagnosis not present

## 2016-10-18 DIAGNOSIS — R296 Repeated falls: Secondary | ICD-10-CM | POA: Diagnosis not present

## 2016-10-18 DIAGNOSIS — Z435 Encounter for attention to cystostomy: Secondary | ICD-10-CM | POA: Diagnosis not present

## 2016-10-18 DIAGNOSIS — R634 Abnormal weight loss: Secondary | ICD-10-CM | POA: Diagnosis not present

## 2016-10-18 DIAGNOSIS — R339 Retention of urine, unspecified: Secondary | ICD-10-CM | POA: Diagnosis not present

## 2016-10-23 DIAGNOSIS — R339 Retention of urine, unspecified: Secondary | ICD-10-CM | POA: Diagnosis not present

## 2016-10-23 DIAGNOSIS — Z86718 Personal history of other venous thrombosis and embolism: Secondary | ICD-10-CM | POA: Diagnosis not present

## 2016-10-23 DIAGNOSIS — R600 Localized edema: Secondary | ICD-10-CM | POA: Diagnosis not present

## 2016-10-23 DIAGNOSIS — Z435 Encounter for attention to cystostomy: Secondary | ICD-10-CM | POA: Diagnosis not present

## 2016-10-23 DIAGNOSIS — R634 Abnormal weight loss: Secondary | ICD-10-CM | POA: Diagnosis not present

## 2016-10-23 DIAGNOSIS — R296 Repeated falls: Secondary | ICD-10-CM | POA: Diagnosis not present

## 2016-10-24 DIAGNOSIS — Z435 Encounter for attention to cystostomy: Secondary | ICD-10-CM | POA: Diagnosis not present

## 2016-10-24 DIAGNOSIS — R600 Localized edema: Secondary | ICD-10-CM | POA: Diagnosis not present

## 2016-10-24 DIAGNOSIS — R339 Retention of urine, unspecified: Secondary | ICD-10-CM | POA: Diagnosis not present

## 2016-10-24 DIAGNOSIS — Z86718 Personal history of other venous thrombosis and embolism: Secondary | ICD-10-CM | POA: Diagnosis not present

## 2016-10-24 DIAGNOSIS — R634 Abnormal weight loss: Secondary | ICD-10-CM | POA: Diagnosis not present

## 2016-10-24 DIAGNOSIS — R296 Repeated falls: Secondary | ICD-10-CM | POA: Diagnosis not present

## 2016-10-25 DIAGNOSIS — R339 Retention of urine, unspecified: Secondary | ICD-10-CM | POA: Diagnosis not present

## 2016-10-25 DIAGNOSIS — R600 Localized edema: Secondary | ICD-10-CM | POA: Diagnosis not present

## 2016-10-25 DIAGNOSIS — R634 Abnormal weight loss: Secondary | ICD-10-CM | POA: Diagnosis not present

## 2016-10-25 DIAGNOSIS — Z86718 Personal history of other venous thrombosis and embolism: Secondary | ICD-10-CM | POA: Diagnosis not present

## 2016-10-25 DIAGNOSIS — R296 Repeated falls: Secondary | ICD-10-CM | POA: Diagnosis not present

## 2016-10-25 DIAGNOSIS — Z435 Encounter for attention to cystostomy: Secondary | ICD-10-CM | POA: Diagnosis not present

## 2016-10-29 DIAGNOSIS — R296 Repeated falls: Secondary | ICD-10-CM | POA: Diagnosis not present

## 2016-10-29 DIAGNOSIS — Z435 Encounter for attention to cystostomy: Secondary | ICD-10-CM | POA: Diagnosis not present

## 2016-10-29 DIAGNOSIS — R339 Retention of urine, unspecified: Secondary | ICD-10-CM | POA: Diagnosis not present

## 2016-10-29 DIAGNOSIS — Z86718 Personal history of other venous thrombosis and embolism: Secondary | ICD-10-CM | POA: Diagnosis not present

## 2016-10-29 DIAGNOSIS — R600 Localized edema: Secondary | ICD-10-CM | POA: Diagnosis not present

## 2016-10-29 DIAGNOSIS — R634 Abnormal weight loss: Secondary | ICD-10-CM | POA: Diagnosis not present

## 2016-10-30 DIAGNOSIS — Z435 Encounter for attention to cystostomy: Secondary | ICD-10-CM | POA: Diagnosis not present

## 2016-10-30 DIAGNOSIS — R600 Localized edema: Secondary | ICD-10-CM | POA: Diagnosis not present

## 2016-10-30 DIAGNOSIS — R296 Repeated falls: Secondary | ICD-10-CM | POA: Diagnosis not present

## 2016-10-30 DIAGNOSIS — R634 Abnormal weight loss: Secondary | ICD-10-CM | POA: Diagnosis not present

## 2016-10-30 DIAGNOSIS — Z86718 Personal history of other venous thrombosis and embolism: Secondary | ICD-10-CM | POA: Diagnosis not present

## 2016-10-30 DIAGNOSIS — R339 Retention of urine, unspecified: Secondary | ICD-10-CM | POA: Diagnosis not present

## 2016-10-31 DIAGNOSIS — R634 Abnormal weight loss: Secondary | ICD-10-CM | POA: Diagnosis not present

## 2016-10-31 DIAGNOSIS — B351 Tinea unguium: Secondary | ICD-10-CM | POA: Diagnosis not present

## 2016-10-31 DIAGNOSIS — Z435 Encounter for attention to cystostomy: Secondary | ICD-10-CM | POA: Diagnosis not present

## 2016-10-31 DIAGNOSIS — R339 Retention of urine, unspecified: Secondary | ICD-10-CM | POA: Diagnosis not present

## 2016-10-31 DIAGNOSIS — R600 Localized edema: Secondary | ICD-10-CM | POA: Diagnosis not present

## 2016-10-31 DIAGNOSIS — B353 Tinea pedis: Secondary | ICD-10-CM | POA: Diagnosis not present

## 2016-10-31 DIAGNOSIS — R296 Repeated falls: Secondary | ICD-10-CM | POA: Diagnosis not present

## 2016-10-31 DIAGNOSIS — Z86718 Personal history of other venous thrombosis and embolism: Secondary | ICD-10-CM | POA: Diagnosis not present

## 2016-11-01 DIAGNOSIS — Z435 Encounter for attention to cystostomy: Secondary | ICD-10-CM | POA: Diagnosis not present

## 2016-11-01 DIAGNOSIS — R600 Localized edema: Secondary | ICD-10-CM | POA: Diagnosis not present

## 2016-11-01 DIAGNOSIS — Z86718 Personal history of other venous thrombosis and embolism: Secondary | ICD-10-CM | POA: Diagnosis not present

## 2016-11-01 DIAGNOSIS — R339 Retention of urine, unspecified: Secondary | ICD-10-CM | POA: Diagnosis not present

## 2016-11-01 DIAGNOSIS — R634 Abnormal weight loss: Secondary | ICD-10-CM | POA: Diagnosis not present

## 2016-11-01 DIAGNOSIS — R296 Repeated falls: Secondary | ICD-10-CM | POA: Diagnosis not present

## 2016-11-04 DIAGNOSIS — R634 Abnormal weight loss: Secondary | ICD-10-CM | POA: Diagnosis not present

## 2016-11-04 DIAGNOSIS — R339 Retention of urine, unspecified: Secondary | ICD-10-CM | POA: Diagnosis not present

## 2016-11-04 DIAGNOSIS — Z86718 Personal history of other venous thrombosis and embolism: Secondary | ICD-10-CM | POA: Diagnosis not present

## 2016-11-04 DIAGNOSIS — R600 Localized edema: Secondary | ICD-10-CM | POA: Diagnosis not present

## 2016-11-04 DIAGNOSIS — Z435 Encounter for attention to cystostomy: Secondary | ICD-10-CM | POA: Diagnosis not present

## 2016-11-04 DIAGNOSIS — R296 Repeated falls: Secondary | ICD-10-CM | POA: Diagnosis not present

## 2016-11-05 DIAGNOSIS — R339 Retention of urine, unspecified: Secondary | ICD-10-CM | POA: Diagnosis not present

## 2016-11-05 DIAGNOSIS — R634 Abnormal weight loss: Secondary | ICD-10-CM | POA: Diagnosis not present

## 2016-11-05 DIAGNOSIS — R600 Localized edema: Secondary | ICD-10-CM | POA: Diagnosis not present

## 2016-11-05 DIAGNOSIS — Z86718 Personal history of other venous thrombosis and embolism: Secondary | ICD-10-CM | POA: Diagnosis not present

## 2016-11-05 DIAGNOSIS — R296 Repeated falls: Secondary | ICD-10-CM | POA: Diagnosis not present

## 2016-11-05 DIAGNOSIS — Z435 Encounter for attention to cystostomy: Secondary | ICD-10-CM | POA: Diagnosis not present

## 2016-11-06 DIAGNOSIS — R634 Abnormal weight loss: Secondary | ICD-10-CM | POA: Diagnosis not present

## 2016-11-06 DIAGNOSIS — R600 Localized edema: Secondary | ICD-10-CM | POA: Diagnosis not present

## 2016-11-06 DIAGNOSIS — R296 Repeated falls: Secondary | ICD-10-CM | POA: Diagnosis not present

## 2016-11-06 DIAGNOSIS — R339 Retention of urine, unspecified: Secondary | ICD-10-CM | POA: Diagnosis not present

## 2016-11-06 DIAGNOSIS — Z435 Encounter for attention to cystostomy: Secondary | ICD-10-CM | POA: Diagnosis not present

## 2016-11-06 DIAGNOSIS — Z86718 Personal history of other venous thrombosis and embolism: Secondary | ICD-10-CM | POA: Diagnosis not present

## 2016-11-08 DIAGNOSIS — R634 Abnormal weight loss: Secondary | ICD-10-CM | POA: Diagnosis not present

## 2016-11-08 DIAGNOSIS — R296 Repeated falls: Secondary | ICD-10-CM | POA: Diagnosis not present

## 2016-11-08 DIAGNOSIS — R600 Localized edema: Secondary | ICD-10-CM | POA: Diagnosis not present

## 2016-11-08 DIAGNOSIS — Z435 Encounter for attention to cystostomy: Secondary | ICD-10-CM | POA: Diagnosis not present

## 2016-11-08 DIAGNOSIS — R339 Retention of urine, unspecified: Secondary | ICD-10-CM | POA: Diagnosis not present

## 2016-11-08 DIAGNOSIS — Z86718 Personal history of other venous thrombosis and embolism: Secondary | ICD-10-CM | POA: Diagnosis not present

## 2016-11-12 DIAGNOSIS — R634 Abnormal weight loss: Secondary | ICD-10-CM | POA: Diagnosis not present

## 2016-11-12 DIAGNOSIS — Z86718 Personal history of other venous thrombosis and embolism: Secondary | ICD-10-CM | POA: Diagnosis not present

## 2016-11-12 DIAGNOSIS — R339 Retention of urine, unspecified: Secondary | ICD-10-CM | POA: Diagnosis not present

## 2016-11-12 DIAGNOSIS — Z435 Encounter for attention to cystostomy: Secondary | ICD-10-CM | POA: Diagnosis not present

## 2016-11-12 DIAGNOSIS — R296 Repeated falls: Secondary | ICD-10-CM | POA: Diagnosis not present

## 2016-11-12 DIAGNOSIS — R600 Localized edema: Secondary | ICD-10-CM | POA: Diagnosis not present

## 2016-11-14 DIAGNOSIS — R296 Repeated falls: Secondary | ICD-10-CM | POA: Diagnosis not present

## 2016-11-14 DIAGNOSIS — Z86718 Personal history of other venous thrombosis and embolism: Secondary | ICD-10-CM | POA: Diagnosis not present

## 2016-11-14 DIAGNOSIS — R339 Retention of urine, unspecified: Secondary | ICD-10-CM | POA: Diagnosis not present

## 2016-11-14 DIAGNOSIS — Z435 Encounter for attention to cystostomy: Secondary | ICD-10-CM | POA: Diagnosis not present

## 2016-11-14 DIAGNOSIS — R600 Localized edema: Secondary | ICD-10-CM | POA: Diagnosis not present

## 2016-11-14 DIAGNOSIS — R634 Abnormal weight loss: Secondary | ICD-10-CM | POA: Diagnosis not present

## 2016-11-15 DIAGNOSIS — N390 Urinary tract infection, site not specified: Secondary | ICD-10-CM | POA: Diagnosis not present

## 2016-11-15 DIAGNOSIS — Z435 Encounter for attention to cystostomy: Secondary | ICD-10-CM | POA: Diagnosis not present

## 2016-11-15 DIAGNOSIS — Z79899 Other long term (current) drug therapy: Secondary | ICD-10-CM | POA: Diagnosis not present

## 2016-11-15 DIAGNOSIS — R339 Retention of urine, unspecified: Secondary | ICD-10-CM | POA: Diagnosis not present

## 2016-11-15 DIAGNOSIS — R296 Repeated falls: Secondary | ICD-10-CM | POA: Diagnosis not present

## 2016-11-15 DIAGNOSIS — R600 Localized edema: Secondary | ICD-10-CM | POA: Diagnosis not present

## 2016-11-15 DIAGNOSIS — Z86718 Personal history of other venous thrombosis and embolism: Secondary | ICD-10-CM | POA: Diagnosis not present

## 2016-11-15 DIAGNOSIS — R634 Abnormal weight loss: Secondary | ICD-10-CM | POA: Diagnosis not present

## 2016-11-18 DIAGNOSIS — Z435 Encounter for attention to cystostomy: Secondary | ICD-10-CM | POA: Diagnosis not present

## 2016-11-18 DIAGNOSIS — R634 Abnormal weight loss: Secondary | ICD-10-CM | POA: Diagnosis not present

## 2016-11-18 DIAGNOSIS — R339 Retention of urine, unspecified: Secondary | ICD-10-CM | POA: Diagnosis not present

## 2016-11-18 DIAGNOSIS — Z86718 Personal history of other venous thrombosis and embolism: Secondary | ICD-10-CM | POA: Diagnosis not present

## 2016-11-18 DIAGNOSIS — R296 Repeated falls: Secondary | ICD-10-CM | POA: Diagnosis not present

## 2016-11-18 DIAGNOSIS — R600 Localized edema: Secondary | ICD-10-CM | POA: Diagnosis not present

## 2016-11-19 DIAGNOSIS — R338 Other retention of urine: Secondary | ICD-10-CM | POA: Diagnosis not present

## 2016-11-19 DIAGNOSIS — I129 Hypertensive chronic kidney disease with stage 1 through stage 4 chronic kidney disease, or unspecified chronic kidney disease: Secondary | ICD-10-CM | POA: Diagnosis not present

## 2016-11-19 DIAGNOSIS — L989 Disorder of the skin and subcutaneous tissue, unspecified: Secondary | ICD-10-CM | POA: Diagnosis not present

## 2016-11-19 DIAGNOSIS — N401 Enlarged prostate with lower urinary tract symptoms: Secondary | ICD-10-CM | POA: Diagnosis not present

## 2016-11-19 DIAGNOSIS — R7303 Prediabetes: Secondary | ICD-10-CM | POA: Diagnosis not present

## 2016-11-19 DIAGNOSIS — N183 Chronic kidney disease, stage 3 (moderate): Secondary | ICD-10-CM | POA: Diagnosis not present

## 2016-11-21 ENCOUNTER — Encounter (HOSPITAL_COMMUNITY): Payer: Self-pay | Admitting: Emergency Medicine

## 2016-11-21 ENCOUNTER — Emergency Department (HOSPITAL_COMMUNITY)
Admission: EM | Admit: 2016-11-21 | Discharge: 2016-11-21 | Disposition: A | Discharge status: Home / Self Care | Payer: Medicare Other | Attending: Emergency Medicine | Admitting: Emergency Medicine

## 2016-11-21 DIAGNOSIS — R000 Tachycardia, unspecified: Secondary | ICD-10-CM | POA: Diagnosis not present

## 2016-11-21 DIAGNOSIS — Y999 Unspecified external cause status: Secondary | ICD-10-CM | POA: Diagnosis not present

## 2016-11-21 DIAGNOSIS — R531 Weakness: Secondary | ICD-10-CM | POA: Diagnosis not present

## 2016-11-21 DIAGNOSIS — Z7982 Long term (current) use of aspirin: Secondary | ICD-10-CM | POA: Diagnosis not present

## 2016-11-21 DIAGNOSIS — R55 Syncope and collapse: Secondary | ICD-10-CM | POA: Diagnosis not present

## 2016-11-21 DIAGNOSIS — Y9389 Activity, other specified: Secondary | ICD-10-CM | POA: Diagnosis not present

## 2016-11-21 DIAGNOSIS — Y92129 Unspecified place in nursing home as the place of occurrence of the external cause: Secondary | ICD-10-CM | POA: Insufficient documentation

## 2016-11-21 DIAGNOSIS — S0990XA Unspecified injury of head, initial encounter: Secondary | ICD-10-CM | POA: Diagnosis not present

## 2016-11-21 DIAGNOSIS — W06XXXA Fall from bed, initial encounter: Secondary | ICD-10-CM | POA: Insufficient documentation

## 2016-11-21 DIAGNOSIS — N184 Chronic kidney disease, stage 4 (severe): Secondary | ICD-10-CM | POA: Insufficient documentation

## 2016-11-21 DIAGNOSIS — Z951 Presence of aortocoronary bypass graft: Secondary | ICD-10-CM | POA: Insufficient documentation

## 2016-11-21 DIAGNOSIS — R296 Repeated falls: Secondary | ICD-10-CM | POA: Diagnosis not present

## 2016-11-21 DIAGNOSIS — Z87891 Personal history of nicotine dependence: Secondary | ICD-10-CM | POA: Diagnosis not present

## 2016-11-21 DIAGNOSIS — I252 Old myocardial infarction: Secondary | ICD-10-CM | POA: Diagnosis not present

## 2016-11-21 DIAGNOSIS — W19XXXA Unspecified fall, initial encounter: Secondary | ICD-10-CM

## 2016-11-21 DIAGNOSIS — M79605 Pain in left leg: Secondary | ICD-10-CM | POA: Diagnosis not present

## 2016-11-21 DIAGNOSIS — E1122 Type 2 diabetes mellitus with diabetic chronic kidney disease: Secondary | ICD-10-CM | POA: Insufficient documentation

## 2016-11-21 LAB — CBC WITH DIFFERENTIAL/PLATELET
Basophils Absolute: 0.1 10*3/uL (ref 0.0–0.1)
Basophils Relative: 1 %
Eosinophils Absolute: 0.2 10*3/uL (ref 0.0–0.7)
Eosinophils Relative: 2 %
HCT: 39.7 % (ref 39.0–52.0)
Hemoglobin: 13.1 g/dL (ref 13.0–17.0)
Lymphocytes Relative: 14 %
Lymphs Abs: 1.3 10*3/uL (ref 0.7–4.0)
MCH: 30.5 pg (ref 26.0–34.0)
MCHC: 33 g/dL (ref 30.0–36.0)
MCV: 92.3 fL (ref 78.0–100.0)
Monocytes Absolute: 1 10*3/uL (ref 0.1–1.0)
Monocytes Relative: 11 %
Neutro Abs: 7 10*3/uL (ref 1.7–7.7)
Neutrophils Relative %: 72 %
Platelets: 229 10*3/uL (ref 150–400)
RBC: 4.3 MIL/uL (ref 4.22–5.81)
RDW: 14.6 % (ref 11.5–15.5)
WBC: 9.6 10*3/uL (ref 4.0–10.5)

## 2016-11-21 LAB — URINALYSIS, ROUTINE W REFLEX MICROSCOPIC
Bilirubin Urine: NEGATIVE
Glucose, UA: NEGATIVE mg/dL
Hgb urine dipstick: NEGATIVE
Ketones, ur: NEGATIVE mg/dL
Nitrite: POSITIVE — AB
Protein, ur: NEGATIVE mg/dL
Specific Gravity, Urine: 1.016 (ref 1.005–1.030)
pH: 7 (ref 5.0–8.0)

## 2016-11-21 LAB — URINALYSIS, MICROSCOPIC (REFLEX): Squamous Epithelial / LPF: NONE SEEN

## 2016-11-21 LAB — BASIC METABOLIC PANEL
Anion gap: 6 (ref 5–15)
BUN: 14 mg/dL (ref 6–20)
CO2: 27 mmol/L (ref 22–32)
Calcium: 9.2 mg/dL (ref 8.9–10.3)
Chloride: 108 mmol/L (ref 101–111)
Creatinine, Ser: 0.97 mg/dL (ref 0.61–1.24)
GFR calc Af Amer: >60 mL/min (ref >60)
GFR calc non Af Amer: >60 mL/min (ref >60)
Glucose, Bld: 103 mg/dL — ABNORMAL HIGH (ref 65–99)
Potassium: 4.2 mmol/L (ref 3.5–5.1)
Sodium: 141 mmol/L (ref 135–145)

## 2016-11-21 LAB — TROPONIN I: Troponin I: <0.03 ng/mL (ref <0.03)

## 2016-11-21 LAB — MAGNESIUM: Magnesium: 2.2 mg/dL (ref 1.7–2.4)

## 2016-11-21 NOTE — Discharge Planning (Signed)
Pt up for discharge. EDCM reviewed chart for possible CM needs.  No needs identified or communicated.  

## 2016-11-21 NOTE — ED Notes (Signed)
[facility] made aware pt will be coming back.

## 2016-11-21 NOTE — ED Provider Notes (Signed)
[facility] DEPT Provider Note   CSN: [id number] Arrival date & time: 11/21/16  [id number]  By signing my name below, I, Evelene Croon, attest that this documentation has been prepared under the direction and in the presence of Virgel Manifold, MD . Electronically Signed: Evelene Croon, Scribe. 11/21/2016. 11:17 AM.  History   Chief Complaint Chief Complaint  Patient presents with  . Loss of Consciousness     The history is provided by the patient and a relative. No language interpreter was used.    HPI Comments:  Ryan Pacheco is a 83 y.o. male with a history of MI, DM, CKD, and a foley catheter in place, who presents to the Emergency Department via EMS from assisted living facility s/p fall this AM. Pt states he fell while getting out of bed because he cannot walk that well and lost his balance.  He states he struck his head but denies LOC. Pt was found on the ground by staff.  He denies pain at this time. No neck pain or back pain. Family denies acute change in mentation. Per triage note pt reported that he blacked out PTA.   Pt has a suprapubic catheter in place since July 2017; family states it is supposed to be changed once a month.   Past Medical History:  Diagnosis Date  . Acid reflux   . Angina   . Arthritis 01-09-12   hands, foot  . Benign prostatic hypertrophy   . Bradycardia   . Chronic kidney disease    Stage 1  through Stage 4 of unspecified chronic kidney disease.  . Diabetes mellitus   . Edema   . Embolism - blood clot ~ 2000   "behind left knee"  . Foley catheter in place    at present to leg bag  . Hearing impaired person, bilateral    right ear hearing aid-"loss other"  . History of asbestos exposure    "have some lung disease from years of exposure"  . Impaired gait and mobility    ambulates with walker- prone to frequent falls  . Myocardial infarction 11/17/10    NSTEMI- Dr. Irish Lack follows  . Prostate hypertrophy 01-09-12   urinary retention-Foley  catheter at present; surgery planned    Patient Active Problem List   Diagnosis Date Noted  . Acute renal failure ([place]) 05/19/2016  . Bladder outlet obstruction 05/19/2016  . Hyperkalemia 05/19/2016  . Frequent falls 05/19/2016  . Mixed hyperlipidemia 11/10/2013  . Old myocardial infarction 11/10/2013  . Edema 11/10/2013  . Postsurgical aortocoronary bypass status 02/06/2012  . Coronary atherosclerosis 02/06/2012    Past Surgical History:  Procedure Laterality Date  . BLEPHAROPLASTY     left eye  . CARDIAC CATHETERIZATION  01-09-12   1'13  . CATARACT EXTRACTION, BILATERAL  ~ 2010  . CHOLECYSTECTOMY  1980's  . CORONARY ARTERY BYPASS GRAFT  11/26/2011   Procedure:x4- CORONARY ARTERY BYPASS GRAFTING (CABG);  Surgeon: Gaye Pollack, MD;  Location: [facility];  Service: Open Heart Surgery;  Laterality: N/A;  . INSERTION OF SUPRAPUBIC CATHETER N/A 08/05/2016   Procedure: CYSTOSCOPY AND INSERTION OF SUPRAPUBIC CATHETER;  Surgeon: Carolan Clines, MD;  Location: [facility];  Service: Urology;  Laterality: N/A;  . LEFT HEART CATHETERIZATION WITH CORONARY ANGIOGRAM N/A 11/20/2011   Procedure: LEFT HEART CATHETERIZATION WITH CORONARY ANGIOGRAM;  Surgeon: Jettie Booze, MD;  Location: [facility] CATH LAB;  Service: Cardiovascular;  Laterality: N/A;  possible PCI  . Suprapubic tube     3'13, then  removed.       Home Medications    Prior to Admission medications   Medication Sig Start Date End Date Taking? Authorizing Provider  acetaminophen (TYLENOL) 325 MG tablet Take 650 mg by mouth every 6 (six) hours as needed for mild pain.    Historical Provider, MD  aspirin EC 81 MG tablet Take 81 mg by mouth daily.    Historical Provider, MD  atorvastatin (LIPITOR) 10 MG tablet Take 5 mg by mouth daily at 8 pm.     Historical Provider, MD  docusate sodium (COLACE) 100 MG capsule Take 100 mg by mouth 2 (two) times daily.    Historical Provider, MD  furosemide (LASIX) 40 MG tablet Take 40 mg by mouth daily  after breakfast. May take extra dose of Lasix up to 2 times weekly for edema.    Historical Provider, MD  methylcellulose (ARTIFICIAL TEARS) 1 % ophthalmic solution Place 2 drops into both eyes 4 (four) times daily.    Historical Provider, MD  Multiple Vitamin (MULITIVITAMIN WITH MINERALS) TABS Take 1 tablet by mouth daily.    Historical Provider, MD  pantoprazole (PROTONIX) 40 MG tablet Take 40 mg by mouth daily.     Historical Provider, MD  potassium chloride SA (K-DUR,KLOR-CON) 20 MEQ tablet Take 20 mEq by mouth daily.     Historical Provider, MD  Skin Protectants, Misc. (DIMETHICONE-ZINC OXIDE) cream Apply 1 application topically 2 (two) times daily. BAZA PROTECT CREAM-apply skin prep to reddened area; apply to protect from moisture.    Historical Provider, MD  terbinafine (LAMISIL) 1 % cream Apply 1 application topically 2 (two) times daily. 1 application to rash twice daily    Historical Provider, MD    Family History Family History  Problem Relation Age of Onset  . Heart attack Father   . Hypertension Neg Hx   . Stroke Neg Hx     Social History Social History  Substance Use Topics  . Smoking status: Former Smoker    Types: Cigars  . Smokeless tobacco: Former Systems developer    Types: Chew     Comment: "hadn't used chew or smoked cigars since 1990's  . Alcohol use No     Allergies   Patient has no known allergies.   Review of Systems Review of Systems  Constitutional:       +fall  Cardiovascular: Negative for chest pain.  Gastrointestinal: Negative for abdominal pain.  Musculoskeletal: Negative for back pain and neck pain.  Neurological: Negative for syncope and headaches.  All other systems reviewed and are negative.    Physical Exam Updated Vital Signs BP (!) 153/54 (BP Location: Right Arm)   Pulse (!) 40 Comment: Notified Chelsea(RN)  Resp 14   SpO2 99%   Physical Exam  Constitutional: He is oriented to person, place, and time. He appears well-developed and  well-nourished.  HENT:  Head: Normocephalic and atraumatic.  Eyes: EOM are normal.  Neck: Normal range of motion.  No midline cervical tenderness   Cardiovascular: Normal rate, regular rhythm and intact distal pulses.   Murmur (systolic) heard. Pulmonary/Chest: Effort normal and breath sounds normal. No respiratory distress. He exhibits no tenderness.  sternotomy scar noted  Abdominal: Soft. He exhibits no distension. There is no tenderness.  Suprapubic catheter in place  Musculoskeletal: Normal range of motion.  ROM to large joints without any apparent pain  Una boot on LLE   Neurological: He is alert and oriented to person, place, and time.  Skin: Skin is warm  and dry.  Psychiatric: He has a normal mood and affect. Judgment normal.  Nursing note and vitals reviewed.    ED Treatments / Results  DIAGNOSTIC STUDIES:  Oxygen Saturation is 99% on RA, normal by my interpretation.    COORDINATION OF CARE:  11:12 AM Discussed treatment plan with pt and family at bedside and pt agreed to plan.  Labs (all labs ordered are listed, but only abnormal results are displayed) Labs Reviewed  CBC WITH DIFFERENTIAL/PLATELET  BASIC METABOLIC PANEL  URINALYSIS, ROUTINE W REFLEX MICROSCOPIC  MAGNESIUM  TROPONIN I    EKG  EKG Interpretation  Date/Time:  Thursday November 21 2016 09:55:11 EST Ventricular Rate:  64 PR Interval:    QRS Duration: 109 QT Interval:  375 QTC Calculation: 387 R Axis:   71 Text Interpretation:  Sinus rhythm Ventricular bigeminy Borderline prolonged PR interval RSR' in V1 or V2, right VCD or RVH Repol abnrm,  global ischemia  Confirmed by Wilson Singer  MD, Tyriq Moragne [id number]) on 11/21/2016 11:01:07 AM       Radiology No results found.  Procedures Procedures (including critical care time)  Medications Ordered in ED Medications - No data to display   Initial Impression / Assessment and Plan / ED Course  I have reviewed the triage vital signs and the nursing  notes.  Pertinent labs & imaging results that were available during my care of the patient were reviewed by me and considered in my medical decision making (see chart for details).     83 year old male presenting after a fall. He currently has no acute complaints. Urinalysis was noted. He has a suprapubic catheter. He denies any abdominal pain. Is afebrile.  Final Clinical Impressions(s) / ED Diagnoses   Final diagnoses:  Fall, initial encounter    New Prescriptions New Prescriptions   No medications on file   I personally preformed the services scribed in my presence. The recorded information has been reviewed is accurate. Virgel Manifold, MD.     Virgel Manifold, MD 11/25/16 [phone]

## 2016-11-21 NOTE — ED Notes (Signed)
ptar at bedside to transport patient back to nursing facility.

## 2016-11-21 NOTE — ED Triage Notes (Addendum)
Pt arrives via [facility] from [place] at [facility], ems reports pt was LSN at 0730, found on his floor at 0800, pt reports he "blacked out." pt has no c/o at this time, a/o, speech clear, resp e/u. Pt has foley catheter in place upon arrival. Ems reports pt in bigeminy on cardiac monitor.

## 2016-11-21 NOTE — ED Notes (Signed)
Calling [facility] to take [facility] to [facility]. Pt given coke. Pt's daughter took his shirt and pants.

## 2016-11-22 DIAGNOSIS — N401 Enlarged prostate with lower urinary tract symptoms: Secondary | ICD-10-CM | POA: Diagnosis not present

## 2016-11-22 DIAGNOSIS — R338 Other retention of urine: Secondary | ICD-10-CM | POA: Diagnosis not present

## 2016-11-22 DIAGNOSIS — I129 Hypertensive chronic kidney disease with stage 1 through stage 4 chronic kidney disease, or unspecified chronic kidney disease: Secondary | ICD-10-CM | POA: Diagnosis not present

## 2016-11-22 DIAGNOSIS — N183 Chronic kidney disease, stage 3 (moderate): Secondary | ICD-10-CM | POA: Diagnosis not present

## 2016-11-22 DIAGNOSIS — R7303 Prediabetes: Secondary | ICD-10-CM | POA: Diagnosis not present

## 2016-11-22 DIAGNOSIS — L989 Disorder of the skin and subcutaneous tissue, unspecified: Secondary | ICD-10-CM | POA: Diagnosis not present

## 2016-11-26 DIAGNOSIS — N183 Chronic kidney disease, stage 3 (moderate): Secondary | ICD-10-CM | POA: Diagnosis not present

## 2016-11-26 DIAGNOSIS — I129 Hypertensive chronic kidney disease with stage 1 through stage 4 chronic kidney disease, or unspecified chronic kidney disease: Secondary | ICD-10-CM | POA: Diagnosis not present

## 2016-11-26 DIAGNOSIS — R338 Other retention of urine: Secondary | ICD-10-CM | POA: Diagnosis not present

## 2016-11-26 DIAGNOSIS — L989 Disorder of the skin and subcutaneous tissue, unspecified: Secondary | ICD-10-CM | POA: Diagnosis not present

## 2016-11-26 DIAGNOSIS — N401 Enlarged prostate with lower urinary tract symptoms: Secondary | ICD-10-CM | POA: Diagnosis not present

## 2016-11-26 DIAGNOSIS — R7303 Prediabetes: Secondary | ICD-10-CM | POA: Diagnosis not present

## 2016-11-28 DIAGNOSIS — R7303 Prediabetes: Secondary | ICD-10-CM | POA: Diagnosis not present

## 2016-11-28 DIAGNOSIS — N183 Chronic kidney disease, stage 3 (moderate): Secondary | ICD-10-CM | POA: Diagnosis not present

## 2016-11-28 DIAGNOSIS — I129 Hypertensive chronic kidney disease with stage 1 through stage 4 chronic kidney disease, or unspecified chronic kidney disease: Secondary | ICD-10-CM | POA: Diagnosis not present

## 2016-11-28 DIAGNOSIS — L989 Disorder of the skin and subcutaneous tissue, unspecified: Secondary | ICD-10-CM | POA: Diagnosis not present

## 2016-11-28 DIAGNOSIS — N401 Enlarged prostate with lower urinary tract symptoms: Secondary | ICD-10-CM | POA: Diagnosis not present

## 2016-11-28 DIAGNOSIS — R338 Other retention of urine: Secondary | ICD-10-CM | POA: Diagnosis not present

## 2016-11-29 DIAGNOSIS — N183 Chronic kidney disease, stage 3 (moderate): Secondary | ICD-10-CM | POA: Diagnosis not present

## 2016-11-29 DIAGNOSIS — R338 Other retention of urine: Secondary | ICD-10-CM | POA: Diagnosis not present

## 2016-11-29 DIAGNOSIS — I129 Hypertensive chronic kidney disease with stage 1 through stage 4 chronic kidney disease, or unspecified chronic kidney disease: Secondary | ICD-10-CM | POA: Diagnosis not present

## 2016-11-29 DIAGNOSIS — N401 Enlarged prostate with lower urinary tract symptoms: Secondary | ICD-10-CM | POA: Diagnosis not present

## 2016-11-29 DIAGNOSIS — L989 Disorder of the skin and subcutaneous tissue, unspecified: Secondary | ICD-10-CM | POA: Diagnosis not present

## 2016-11-29 DIAGNOSIS — R7303 Prediabetes: Secondary | ICD-10-CM | POA: Diagnosis not present

## 2016-12-03 DIAGNOSIS — R338 Other retention of urine: Secondary | ICD-10-CM | POA: Diagnosis not present

## 2016-12-03 DIAGNOSIS — I129 Hypertensive chronic kidney disease with stage 1 through stage 4 chronic kidney disease, or unspecified chronic kidney disease: Secondary | ICD-10-CM | POA: Diagnosis not present

## 2016-12-03 DIAGNOSIS — N183 Chronic kidney disease, stage 3 (moderate): Secondary | ICD-10-CM | POA: Diagnosis not present

## 2016-12-03 DIAGNOSIS — L989 Disorder of the skin and subcutaneous tissue, unspecified: Secondary | ICD-10-CM | POA: Diagnosis not present

## 2016-12-03 DIAGNOSIS — R7303 Prediabetes: Secondary | ICD-10-CM | POA: Diagnosis not present

## 2016-12-03 DIAGNOSIS — N401 Enlarged prostate with lower urinary tract symptoms: Secondary | ICD-10-CM | POA: Diagnosis not present

## 2016-12-04 DIAGNOSIS — N401 Enlarged prostate with lower urinary tract symptoms: Secondary | ICD-10-CM | POA: Diagnosis not present

## 2016-12-04 DIAGNOSIS — I129 Hypertensive chronic kidney disease with stage 1 through stage 4 chronic kidney disease, or unspecified chronic kidney disease: Secondary | ICD-10-CM | POA: Diagnosis not present

## 2016-12-04 DIAGNOSIS — R7303 Prediabetes: Secondary | ICD-10-CM | POA: Diagnosis not present

## 2016-12-04 DIAGNOSIS — R338 Other retention of urine: Secondary | ICD-10-CM | POA: Diagnosis not present

## 2016-12-04 DIAGNOSIS — N183 Chronic kidney disease, stage 3 (moderate): Secondary | ICD-10-CM | POA: Diagnosis not present

## 2016-12-04 DIAGNOSIS — L989 Disorder of the skin and subcutaneous tissue, unspecified: Secondary | ICD-10-CM | POA: Diagnosis not present

## 2016-12-05 DIAGNOSIS — N390 Urinary tract infection, site not specified: Secondary | ICD-10-CM | POA: Diagnosis not present

## 2016-12-05 DIAGNOSIS — Z79899 Other long term (current) drug therapy: Secondary | ICD-10-CM | POA: Diagnosis not present

## 2016-12-06 DIAGNOSIS — N401 Enlarged prostate with lower urinary tract symptoms: Secondary | ICD-10-CM | POA: Diagnosis not present

## 2016-12-06 DIAGNOSIS — L989 Disorder of the skin and subcutaneous tissue, unspecified: Secondary | ICD-10-CM | POA: Diagnosis not present

## 2016-12-06 DIAGNOSIS — I129 Hypertensive chronic kidney disease with stage 1 through stage 4 chronic kidney disease, or unspecified chronic kidney disease: Secondary | ICD-10-CM | POA: Diagnosis not present

## 2016-12-06 DIAGNOSIS — R7303 Prediabetes: Secondary | ICD-10-CM | POA: Diagnosis not present

## 2016-12-06 DIAGNOSIS — N183 Chronic kidney disease, stage 3 (moderate): Secondary | ICD-10-CM | POA: Diagnosis not present

## 2016-12-06 DIAGNOSIS — R338 Other retention of urine: Secondary | ICD-10-CM | POA: Diagnosis not present

## 2016-12-09 DIAGNOSIS — H9193 Unspecified hearing loss, bilateral: Secondary | ICD-10-CM | POA: Diagnosis not present

## 2016-12-10 DIAGNOSIS — L989 Disorder of the skin and subcutaneous tissue, unspecified: Secondary | ICD-10-CM | POA: Diagnosis not present

## 2016-12-10 DIAGNOSIS — I129 Hypertensive chronic kidney disease with stage 1 through stage 4 chronic kidney disease, or unspecified chronic kidney disease: Secondary | ICD-10-CM | POA: Diagnosis not present

## 2016-12-10 DIAGNOSIS — R338 Other retention of urine: Secondary | ICD-10-CM | POA: Diagnosis not present

## 2016-12-10 DIAGNOSIS — N183 Chronic kidney disease, stage 3 (moderate): Secondary | ICD-10-CM | POA: Diagnosis not present

## 2016-12-10 DIAGNOSIS — R7303 Prediabetes: Secondary | ICD-10-CM | POA: Diagnosis not present

## 2016-12-10 DIAGNOSIS — N401 Enlarged prostate with lower urinary tract symptoms: Secondary | ICD-10-CM | POA: Diagnosis not present

## 2016-12-11 DIAGNOSIS — R7303 Prediabetes: Secondary | ICD-10-CM | POA: Diagnosis not present

## 2016-12-11 DIAGNOSIS — N183 Chronic kidney disease, stage 3 (moderate): Secondary | ICD-10-CM | POA: Diagnosis not present

## 2016-12-11 DIAGNOSIS — R338 Other retention of urine: Secondary | ICD-10-CM | POA: Diagnosis not present

## 2016-12-11 DIAGNOSIS — N401 Enlarged prostate with lower urinary tract symptoms: Secondary | ICD-10-CM | POA: Diagnosis not present

## 2016-12-11 DIAGNOSIS — L989 Disorder of the skin and subcutaneous tissue, unspecified: Secondary | ICD-10-CM | POA: Diagnosis not present

## 2016-12-11 DIAGNOSIS — I129 Hypertensive chronic kidney disease with stage 1 through stage 4 chronic kidney disease, or unspecified chronic kidney disease: Secondary | ICD-10-CM | POA: Diagnosis not present

## 2016-12-13 DIAGNOSIS — R7303 Prediabetes: Secondary | ICD-10-CM | POA: Diagnosis not present

## 2016-12-13 DIAGNOSIS — R338 Other retention of urine: Secondary | ICD-10-CM | POA: Diagnosis not present

## 2016-12-13 DIAGNOSIS — I129 Hypertensive chronic kidney disease with stage 1 through stage 4 chronic kidney disease, or unspecified chronic kidney disease: Secondary | ICD-10-CM | POA: Diagnosis not present

## 2016-12-13 DIAGNOSIS — N401 Enlarged prostate with lower urinary tract symptoms: Secondary | ICD-10-CM | POA: Diagnosis not present

## 2016-12-13 DIAGNOSIS — L989 Disorder of the skin and subcutaneous tissue, unspecified: Secondary | ICD-10-CM | POA: Diagnosis not present

## 2016-12-13 DIAGNOSIS — N183 Chronic kidney disease, stage 3 (moderate): Secondary | ICD-10-CM | POA: Diagnosis not present

## 2016-12-15 DIAGNOSIS — N183 Chronic kidney disease, stage 3 (moderate): Secondary | ICD-10-CM | POA: Diagnosis not present

## 2016-12-15 DIAGNOSIS — R7303 Prediabetes: Secondary | ICD-10-CM | POA: Diagnosis not present

## 2016-12-15 DIAGNOSIS — I129 Hypertensive chronic kidney disease with stage 1 through stage 4 chronic kidney disease, or unspecified chronic kidney disease: Secondary | ICD-10-CM | POA: Diagnosis not present

## 2016-12-15 DIAGNOSIS — L989 Disorder of the skin and subcutaneous tissue, unspecified: Secondary | ICD-10-CM | POA: Diagnosis not present

## 2016-12-15 DIAGNOSIS — N401 Enlarged prostate with lower urinary tract symptoms: Secondary | ICD-10-CM | POA: Diagnosis not present

## 2016-12-15 DIAGNOSIS — R338 Other retention of urine: Secondary | ICD-10-CM | POA: Diagnosis not present

## 2016-12-17 DIAGNOSIS — L989 Disorder of the skin and subcutaneous tissue, unspecified: Secondary | ICD-10-CM | POA: Diagnosis not present

## 2016-12-17 DIAGNOSIS — I129 Hypertensive chronic kidney disease with stage 1 through stage 4 chronic kidney disease, or unspecified chronic kidney disease: Secondary | ICD-10-CM | POA: Diagnosis not present

## 2016-12-17 DIAGNOSIS — R7303 Prediabetes: Secondary | ICD-10-CM | POA: Diagnosis not present

## 2016-12-17 DIAGNOSIS — N401 Enlarged prostate with lower urinary tract symptoms: Secondary | ICD-10-CM | POA: Diagnosis not present

## 2016-12-17 DIAGNOSIS — R338 Other retention of urine: Secondary | ICD-10-CM | POA: Diagnosis not present

## 2016-12-17 DIAGNOSIS — N183 Chronic kidney disease, stage 3 (moderate): Secondary | ICD-10-CM | POA: Diagnosis not present

## 2016-12-18 DIAGNOSIS — H906 Mixed conductive and sensorineural hearing loss, bilateral: Secondary | ICD-10-CM | POA: Diagnosis not present

## 2016-12-18 DIAGNOSIS — H6523 Chronic serous otitis media, bilateral: Secondary | ICD-10-CM | POA: Diagnosis not present

## 2016-12-20 DIAGNOSIS — N183 Chronic kidney disease, stage 3 (moderate): Secondary | ICD-10-CM | POA: Diagnosis not present

## 2016-12-20 DIAGNOSIS — N401 Enlarged prostate with lower urinary tract symptoms: Secondary | ICD-10-CM | POA: Diagnosis not present

## 2016-12-20 DIAGNOSIS — L989 Disorder of the skin and subcutaneous tissue, unspecified: Secondary | ICD-10-CM | POA: Diagnosis not present

## 2016-12-20 DIAGNOSIS — R7303 Prediabetes: Secondary | ICD-10-CM | POA: Diagnosis not present

## 2016-12-20 DIAGNOSIS — R338 Other retention of urine: Secondary | ICD-10-CM | POA: Diagnosis not present

## 2016-12-20 DIAGNOSIS — I129 Hypertensive chronic kidney disease with stage 1 through stage 4 chronic kidney disease, or unspecified chronic kidney disease: Secondary | ICD-10-CM | POA: Diagnosis not present

## 2016-12-24 DIAGNOSIS — N401 Enlarged prostate with lower urinary tract symptoms: Secondary | ICD-10-CM | POA: Diagnosis not present

## 2016-12-24 DIAGNOSIS — L989 Disorder of the skin and subcutaneous tissue, unspecified: Secondary | ICD-10-CM | POA: Diagnosis not present

## 2016-12-24 DIAGNOSIS — I129 Hypertensive chronic kidney disease with stage 1 through stage 4 chronic kidney disease, or unspecified chronic kidney disease: Secondary | ICD-10-CM | POA: Diagnosis not present

## 2016-12-24 DIAGNOSIS — R7303 Prediabetes: Secondary | ICD-10-CM | POA: Diagnosis not present

## 2016-12-24 DIAGNOSIS — R338 Other retention of urine: Secondary | ICD-10-CM | POA: Diagnosis not present

## 2016-12-24 DIAGNOSIS — N183 Chronic kidney disease, stage 3 (moderate): Secondary | ICD-10-CM | POA: Diagnosis not present

## 2016-12-26 DIAGNOSIS — N401 Enlarged prostate with lower urinary tract symptoms: Secondary | ICD-10-CM | POA: Diagnosis not present

## 2016-12-26 DIAGNOSIS — N183 Chronic kidney disease, stage 3 (moderate): Secondary | ICD-10-CM | POA: Diagnosis not present

## 2016-12-26 DIAGNOSIS — L989 Disorder of the skin and subcutaneous tissue, unspecified: Secondary | ICD-10-CM | POA: Diagnosis not present

## 2016-12-26 DIAGNOSIS — I129 Hypertensive chronic kidney disease with stage 1 through stage 4 chronic kidney disease, or unspecified chronic kidney disease: Secondary | ICD-10-CM | POA: Diagnosis not present

## 2016-12-26 DIAGNOSIS — R338 Other retention of urine: Secondary | ICD-10-CM | POA: Diagnosis not present

## 2016-12-26 DIAGNOSIS — R7303 Prediabetes: Secondary | ICD-10-CM | POA: Diagnosis not present

## 2016-12-31 DIAGNOSIS — N401 Enlarged prostate with lower urinary tract symptoms: Secondary | ICD-10-CM | POA: Diagnosis not present

## 2016-12-31 DIAGNOSIS — L989 Disorder of the skin and subcutaneous tissue, unspecified: Secondary | ICD-10-CM | POA: Diagnosis not present

## 2016-12-31 DIAGNOSIS — I129 Hypertensive chronic kidney disease with stage 1 through stage 4 chronic kidney disease, or unspecified chronic kidney disease: Secondary | ICD-10-CM | POA: Diagnosis not present

## 2016-12-31 DIAGNOSIS — R7303 Prediabetes: Secondary | ICD-10-CM | POA: Diagnosis not present

## 2016-12-31 DIAGNOSIS — R338 Other retention of urine: Secondary | ICD-10-CM | POA: Diagnosis not present

## 2016-12-31 DIAGNOSIS — N183 Chronic kidney disease, stage 3 (moderate): Secondary | ICD-10-CM | POA: Diagnosis not present

## 2017-01-03 DIAGNOSIS — R7303 Prediabetes: Secondary | ICD-10-CM | POA: Diagnosis not present

## 2017-01-03 DIAGNOSIS — R338 Other retention of urine: Secondary | ICD-10-CM | POA: Diagnosis not present

## 2017-01-03 DIAGNOSIS — L989 Disorder of the skin and subcutaneous tissue, unspecified: Secondary | ICD-10-CM | POA: Diagnosis not present

## 2017-01-03 DIAGNOSIS — N183 Chronic kidney disease, stage 3 (moderate): Secondary | ICD-10-CM | POA: Diagnosis not present

## 2017-01-03 DIAGNOSIS — I129 Hypertensive chronic kidney disease with stage 1 through stage 4 chronic kidney disease, or unspecified chronic kidney disease: Secondary | ICD-10-CM | POA: Diagnosis not present

## 2017-01-03 DIAGNOSIS — N401 Enlarged prostate with lower urinary tract symptoms: Secondary | ICD-10-CM | POA: Diagnosis not present

## 2017-01-06 DIAGNOSIS — N183 Chronic kidney disease, stage 3 (moderate): Secondary | ICD-10-CM | POA: Diagnosis not present

## 2017-01-06 DIAGNOSIS — R338 Other retention of urine: Secondary | ICD-10-CM | POA: Diagnosis not present

## 2017-01-06 DIAGNOSIS — N401 Enlarged prostate with lower urinary tract symptoms: Secondary | ICD-10-CM | POA: Diagnosis not present

## 2017-01-06 DIAGNOSIS — L989 Disorder of the skin and subcutaneous tissue, unspecified: Secondary | ICD-10-CM | POA: Diagnosis not present

## 2017-01-06 DIAGNOSIS — I129 Hypertensive chronic kidney disease with stage 1 through stage 4 chronic kidney disease, or unspecified chronic kidney disease: Secondary | ICD-10-CM | POA: Diagnosis not present

## 2017-01-06 DIAGNOSIS — R7303 Prediabetes: Secondary | ICD-10-CM | POA: Diagnosis not present

## 2017-01-09 DIAGNOSIS — I129 Hypertensive chronic kidney disease with stage 1 through stage 4 chronic kidney disease, or unspecified chronic kidney disease: Secondary | ICD-10-CM | POA: Diagnosis not present

## 2017-01-09 DIAGNOSIS — R338 Other retention of urine: Secondary | ICD-10-CM | POA: Diagnosis not present

## 2017-01-09 DIAGNOSIS — N183 Chronic kidney disease, stage 3 (moderate): Secondary | ICD-10-CM | POA: Diagnosis not present

## 2017-01-09 DIAGNOSIS — N401 Enlarged prostate with lower urinary tract symptoms: Secondary | ICD-10-CM | POA: Diagnosis not present

## 2017-01-09 DIAGNOSIS — L989 Disorder of the skin and subcutaneous tissue, unspecified: Secondary | ICD-10-CM | POA: Diagnosis not present

## 2017-01-09 DIAGNOSIS — R7303 Prediabetes: Secondary | ICD-10-CM | POA: Diagnosis not present

## 2017-01-13 DIAGNOSIS — I129 Hypertensive chronic kidney disease with stage 1 through stage 4 chronic kidney disease, or unspecified chronic kidney disease: Secondary | ICD-10-CM | POA: Diagnosis not present

## 2017-01-13 DIAGNOSIS — L989 Disorder of the skin and subcutaneous tissue, unspecified: Secondary | ICD-10-CM | POA: Diagnosis not present

## 2017-01-13 DIAGNOSIS — R7303 Prediabetes: Secondary | ICD-10-CM | POA: Diagnosis not present

## 2017-01-13 DIAGNOSIS — N401 Enlarged prostate with lower urinary tract symptoms: Secondary | ICD-10-CM | POA: Diagnosis not present

## 2017-01-13 DIAGNOSIS — N183 Chronic kidney disease, stage 3 (moderate): Secondary | ICD-10-CM | POA: Diagnosis not present

## 2017-01-13 DIAGNOSIS — R338 Other retention of urine: Secondary | ICD-10-CM | POA: Diagnosis not present

## 2017-01-14 DIAGNOSIS — L989 Disorder of the skin and subcutaneous tissue, unspecified: Secondary | ICD-10-CM | POA: Diagnosis not present

## 2017-01-14 DIAGNOSIS — I129 Hypertensive chronic kidney disease with stage 1 through stage 4 chronic kidney disease, or unspecified chronic kidney disease: Secondary | ICD-10-CM | POA: Diagnosis not present

## 2017-01-14 DIAGNOSIS — R338 Other retention of urine: Secondary | ICD-10-CM | POA: Diagnosis not present

## 2017-01-14 DIAGNOSIS — N183 Chronic kidney disease, stage 3 (moderate): Secondary | ICD-10-CM | POA: Diagnosis not present

## 2017-01-14 DIAGNOSIS — R7303 Prediabetes: Secondary | ICD-10-CM | POA: Diagnosis not present

## 2017-01-14 DIAGNOSIS — N401 Enlarged prostate with lower urinary tract symptoms: Secondary | ICD-10-CM | POA: Diagnosis not present

## 2017-01-15 DIAGNOSIS — L570 Actinic keratosis: Secondary | ICD-10-CM | POA: Diagnosis not present

## 2017-01-15 DIAGNOSIS — D485 Neoplasm of uncertain behavior of skin: Secondary | ICD-10-CM | POA: Diagnosis not present

## 2017-01-15 DIAGNOSIS — L821 Other seborrheic keratosis: Secondary | ICD-10-CM | POA: Diagnosis not present

## 2017-01-15 DIAGNOSIS — Z85828 Personal history of other malignant neoplasm of skin: Secondary | ICD-10-CM | POA: Diagnosis not present

## 2017-01-16 DIAGNOSIS — N183 Chronic kidney disease, stage 3 (moderate): Secondary | ICD-10-CM | POA: Diagnosis not present

## 2017-01-16 DIAGNOSIS — N401 Enlarged prostate with lower urinary tract symptoms: Secondary | ICD-10-CM | POA: Diagnosis not present

## 2017-01-16 DIAGNOSIS — I129 Hypertensive chronic kidney disease with stage 1 through stage 4 chronic kidney disease, or unspecified chronic kidney disease: Secondary | ICD-10-CM | POA: Diagnosis not present

## 2017-01-16 DIAGNOSIS — R7303 Prediabetes: Secondary | ICD-10-CM | POA: Diagnosis not present

## 2017-01-16 DIAGNOSIS — R338 Other retention of urine: Secondary | ICD-10-CM | POA: Diagnosis not present

## 2017-01-16 DIAGNOSIS — L989 Disorder of the skin and subcutaneous tissue, unspecified: Secondary | ICD-10-CM | POA: Diagnosis not present

## 2017-01-18 DIAGNOSIS — N183 Chronic kidney disease, stage 3 (moderate): Secondary | ICD-10-CM | POA: Diagnosis not present

## 2017-01-18 DIAGNOSIS — R7303 Prediabetes: Secondary | ICD-10-CM | POA: Diagnosis not present

## 2017-01-18 DIAGNOSIS — I129 Hypertensive chronic kidney disease with stage 1 through stage 4 chronic kidney disease, or unspecified chronic kidney disease: Secondary | ICD-10-CM | POA: Diagnosis not present

## 2017-01-18 DIAGNOSIS — L989 Disorder of the skin and subcutaneous tissue, unspecified: Secondary | ICD-10-CM | POA: Diagnosis not present

## 2017-01-18 DIAGNOSIS — R338 Other retention of urine: Secondary | ICD-10-CM | POA: Diagnosis not present

## 2017-01-18 DIAGNOSIS — N401 Enlarged prostate with lower urinary tract symptoms: Secondary | ICD-10-CM | POA: Diagnosis not present

## 2017-01-20 DIAGNOSIS — R338 Other retention of urine: Secondary | ICD-10-CM | POA: Diagnosis not present

## 2017-01-20 DIAGNOSIS — N183 Chronic kidney disease, stage 3 (moderate): Secondary | ICD-10-CM | POA: Diagnosis not present

## 2017-01-20 DIAGNOSIS — R7303 Prediabetes: Secondary | ICD-10-CM | POA: Diagnosis not present

## 2017-01-20 DIAGNOSIS — N401 Enlarged prostate with lower urinary tract symptoms: Secondary | ICD-10-CM | POA: Diagnosis not present

## 2017-01-20 DIAGNOSIS — I129 Hypertensive chronic kidney disease with stage 1 through stage 4 chronic kidney disease, or unspecified chronic kidney disease: Secondary | ICD-10-CM | POA: Diagnosis not present

## 2017-01-20 DIAGNOSIS — L989 Disorder of the skin and subcutaneous tissue, unspecified: Secondary | ICD-10-CM | POA: Diagnosis not present

## 2017-01-23 DIAGNOSIS — N401 Enlarged prostate with lower urinary tract symptoms: Secondary | ICD-10-CM | POA: Diagnosis not present

## 2017-01-23 DIAGNOSIS — N183 Chronic kidney disease, stage 3 (moderate): Secondary | ICD-10-CM | POA: Diagnosis not present

## 2017-01-23 DIAGNOSIS — I129 Hypertensive chronic kidney disease with stage 1 through stage 4 chronic kidney disease, or unspecified chronic kidney disease: Secondary | ICD-10-CM | POA: Diagnosis not present

## 2017-01-23 DIAGNOSIS — R338 Other retention of urine: Secondary | ICD-10-CM | POA: Diagnosis not present

## 2017-01-23 DIAGNOSIS — R7303 Prediabetes: Secondary | ICD-10-CM | POA: Diagnosis not present

## 2017-01-23 DIAGNOSIS — L989 Disorder of the skin and subcutaneous tissue, unspecified: Secondary | ICD-10-CM | POA: Diagnosis not present

## 2017-01-27 DIAGNOSIS — L989 Disorder of the skin and subcutaneous tissue, unspecified: Secondary | ICD-10-CM | POA: Diagnosis not present

## 2017-01-27 DIAGNOSIS — I129 Hypertensive chronic kidney disease with stage 1 through stage 4 chronic kidney disease, or unspecified chronic kidney disease: Secondary | ICD-10-CM | POA: Diagnosis not present

## 2017-01-27 DIAGNOSIS — N183 Chronic kidney disease, stage 3 (moderate): Secondary | ICD-10-CM | POA: Diagnosis not present

## 2017-01-27 DIAGNOSIS — N401 Enlarged prostate with lower urinary tract symptoms: Secondary | ICD-10-CM | POA: Diagnosis not present

## 2017-01-27 DIAGNOSIS — R338 Other retention of urine: Secondary | ICD-10-CM | POA: Diagnosis not present

## 2017-01-27 DIAGNOSIS — R7303 Prediabetes: Secondary | ICD-10-CM | POA: Diagnosis not present

## 2017-01-31 DIAGNOSIS — R338 Other retention of urine: Secondary | ICD-10-CM | POA: Diagnosis not present

## 2017-01-31 DIAGNOSIS — I129 Hypertensive chronic kidney disease with stage 1 through stage 4 chronic kidney disease, or unspecified chronic kidney disease: Secondary | ICD-10-CM | POA: Diagnosis not present

## 2017-01-31 DIAGNOSIS — N401 Enlarged prostate with lower urinary tract symptoms: Secondary | ICD-10-CM | POA: Diagnosis not present

## 2017-01-31 DIAGNOSIS — R7303 Prediabetes: Secondary | ICD-10-CM | POA: Diagnosis not present

## 2017-01-31 DIAGNOSIS — N183 Chronic kidney disease, stage 3 (moderate): Secondary | ICD-10-CM | POA: Diagnosis not present

## 2017-01-31 DIAGNOSIS — L989 Disorder of the skin and subcutaneous tissue, unspecified: Secondary | ICD-10-CM | POA: Diagnosis not present

## 2017-02-04 DIAGNOSIS — I129 Hypertensive chronic kidney disease with stage 1 through stage 4 chronic kidney disease, or unspecified chronic kidney disease: Secondary | ICD-10-CM | POA: Diagnosis not present

## 2017-02-04 DIAGNOSIS — N183 Chronic kidney disease, stage 3 (moderate): Secondary | ICD-10-CM | POA: Diagnosis not present

## 2017-02-04 DIAGNOSIS — L989 Disorder of the skin and subcutaneous tissue, unspecified: Secondary | ICD-10-CM | POA: Diagnosis not present

## 2017-02-04 DIAGNOSIS — R338 Other retention of urine: Secondary | ICD-10-CM | POA: Diagnosis not present

## 2017-02-04 DIAGNOSIS — N401 Enlarged prostate with lower urinary tract symptoms: Secondary | ICD-10-CM | POA: Diagnosis not present

## 2017-02-04 DIAGNOSIS — R7303 Prediabetes: Secondary | ICD-10-CM | POA: Diagnosis not present

## 2017-02-07 DIAGNOSIS — I129 Hypertensive chronic kidney disease with stage 1 through stage 4 chronic kidney disease, or unspecified chronic kidney disease: Secondary | ICD-10-CM | POA: Diagnosis not present

## 2017-02-07 DIAGNOSIS — L989 Disorder of the skin and subcutaneous tissue, unspecified: Secondary | ICD-10-CM | POA: Diagnosis not present

## 2017-02-07 DIAGNOSIS — R338 Other retention of urine: Secondary | ICD-10-CM | POA: Diagnosis not present

## 2017-02-07 DIAGNOSIS — N183 Chronic kidney disease, stage 3 (moderate): Secondary | ICD-10-CM | POA: Diagnosis not present

## 2017-02-07 DIAGNOSIS — R7303 Prediabetes: Secondary | ICD-10-CM | POA: Diagnosis not present

## 2017-02-07 DIAGNOSIS — N401 Enlarged prostate with lower urinary tract symptoms: Secondary | ICD-10-CM | POA: Diagnosis not present

## 2017-02-11 DIAGNOSIS — R7303 Prediabetes: Secondary | ICD-10-CM | POA: Diagnosis not present

## 2017-02-11 DIAGNOSIS — L989 Disorder of the skin and subcutaneous tissue, unspecified: Secondary | ICD-10-CM | POA: Diagnosis not present

## 2017-02-11 DIAGNOSIS — N401 Enlarged prostate with lower urinary tract symptoms: Secondary | ICD-10-CM | POA: Diagnosis not present

## 2017-02-11 DIAGNOSIS — N183 Chronic kidney disease, stage 3 (moderate): Secondary | ICD-10-CM | POA: Diagnosis not present

## 2017-02-11 DIAGNOSIS — I129 Hypertensive chronic kidney disease with stage 1 through stage 4 chronic kidney disease, or unspecified chronic kidney disease: Secondary | ICD-10-CM | POA: Diagnosis not present

## 2017-02-11 DIAGNOSIS — R338 Other retention of urine: Secondary | ICD-10-CM | POA: Diagnosis not present

## 2017-02-13 DIAGNOSIS — N183 Chronic kidney disease, stage 3 (moderate): Secondary | ICD-10-CM | POA: Diagnosis not present

## 2017-02-13 DIAGNOSIS — I129 Hypertensive chronic kidney disease with stage 1 through stage 4 chronic kidney disease, or unspecified chronic kidney disease: Secondary | ICD-10-CM | POA: Diagnosis not present

## 2017-02-13 DIAGNOSIS — R338 Other retention of urine: Secondary | ICD-10-CM | POA: Diagnosis not present

## 2017-02-13 DIAGNOSIS — L989 Disorder of the skin and subcutaneous tissue, unspecified: Secondary | ICD-10-CM | POA: Diagnosis not present

## 2017-02-13 DIAGNOSIS — N401 Enlarged prostate with lower urinary tract symptoms: Secondary | ICD-10-CM | POA: Diagnosis not present

## 2017-02-13 DIAGNOSIS — R7303 Prediabetes: Secondary | ICD-10-CM | POA: Diagnosis not present

## 2017-02-14 ENCOUNTER — Emergency Department (HOSPITAL_COMMUNITY): Payer: Medicare Other

## 2017-02-14 ENCOUNTER — Inpatient Hospital Stay (HOSPITAL_COMMUNITY)
Admission: EM | Admit: 2017-02-14 | Discharge: 2017-02-20 | DRG: 617 | Disposition: A | Discharge status: Skilled Nursing Facility | Payer: Medicare Other | Attending: Internal Medicine | Admitting: Internal Medicine

## 2017-02-14 ENCOUNTER — Encounter (HOSPITAL_COMMUNITY): Payer: Self-pay | Admitting: *Deleted

## 2017-02-14 DIAGNOSIS — M79671 Pain in right foot: Secondary | ICD-10-CM | POA: Diagnosis not present

## 2017-02-14 DIAGNOSIS — S81802A Unspecified open wound, left lower leg, initial encounter: Secondary | ICD-10-CM | POA: Diagnosis not present

## 2017-02-14 DIAGNOSIS — N323 Diverticulum of bladder: Secondary | ICD-10-CM | POA: Diagnosis not present

## 2017-02-14 DIAGNOSIS — Z87891 Personal history of nicotine dependence: Secondary | ICD-10-CM

## 2017-02-14 DIAGNOSIS — E782 Mixed hyperlipidemia: Secondary | ICD-10-CM | POA: Diagnosis present

## 2017-02-14 DIAGNOSIS — K5900 Constipation, unspecified: Secondary | ICD-10-CM | POA: Diagnosis not present

## 2017-02-14 DIAGNOSIS — M86171 Other acute osteomyelitis, right ankle and foot: Secondary | ICD-10-CM | POA: Diagnosis not present

## 2017-02-14 DIAGNOSIS — E1169 Type 2 diabetes mellitus with other specified complication: Principal | ICD-10-CM | POA: Diagnosis present

## 2017-02-14 DIAGNOSIS — Z9842 Cataract extraction status, left eye: Secondary | ICD-10-CM

## 2017-02-14 DIAGNOSIS — R41841 Cognitive communication deficit: Secondary | ICD-10-CM | POA: Diagnosis not present

## 2017-02-14 DIAGNOSIS — W228XXA Striking against or struck by other objects, initial encounter: Secondary | ICD-10-CM | POA: Diagnosis present

## 2017-02-14 DIAGNOSIS — E785 Hyperlipidemia, unspecified: Secondary | ICD-10-CM | POA: Diagnosis not present

## 2017-02-14 DIAGNOSIS — Z79899 Other long term (current) drug therapy: Secondary | ICD-10-CM

## 2017-02-14 DIAGNOSIS — L03115 Cellulitis of right lower limb: Secondary | ICD-10-CM | POA: Diagnosis present

## 2017-02-14 DIAGNOSIS — S92511A Displaced fracture of proximal phalanx of right lesser toe(s), initial encounter for closed fracture: Secondary | ICD-10-CM | POA: Diagnosis present

## 2017-02-14 DIAGNOSIS — I2510 Atherosclerotic heart disease of native coronary artery without angina pectoris: Secondary | ICD-10-CM | POA: Diagnosis present

## 2017-02-14 DIAGNOSIS — R2689 Other abnormalities of gait and mobility: Secondary | ICD-10-CM | POA: Diagnosis not present

## 2017-02-14 DIAGNOSIS — I10 Essential (primary) hypertension: Secondary | ICD-10-CM | POA: Diagnosis present

## 2017-02-14 DIAGNOSIS — R030 Elevated blood-pressure reading, without diagnosis of hypertension: Secondary | ICD-10-CM | POA: Diagnosis not present

## 2017-02-14 DIAGNOSIS — E440 Moderate protein-calorie malnutrition: Secondary | ICD-10-CM | POA: Diagnosis present

## 2017-02-14 DIAGNOSIS — I129 Hypertensive chronic kidney disease with stage 1 through stage 4 chronic kidney disease, or unspecified chronic kidney disease: Secondary | ICD-10-CM | POA: Diagnosis present

## 2017-02-14 DIAGNOSIS — E1121 Type 2 diabetes mellitus with diabetic nephropathy: Secondary | ICD-10-CM | POA: Diagnosis present

## 2017-02-14 DIAGNOSIS — E1165 Type 2 diabetes mellitus with hyperglycemia: Secondary | ICD-10-CM | POA: Diagnosis present

## 2017-02-14 DIAGNOSIS — Z7982 Long term (current) use of aspirin: Secondary | ICD-10-CM

## 2017-02-14 DIAGNOSIS — Z66 Do not resuscitate: Secondary | ICD-10-CM | POA: Diagnosis present

## 2017-02-14 DIAGNOSIS — E1122 Type 2 diabetes mellitus with diabetic chronic kidney disease: Secondary | ICD-10-CM | POA: Diagnosis present

## 2017-02-14 DIAGNOSIS — N183 Chronic kidney disease, stage 3 unspecified: Secondary | ICD-10-CM | POA: Diagnosis present

## 2017-02-14 DIAGNOSIS — H9190 Unspecified hearing loss, unspecified ear: Secondary | ICD-10-CM | POA: Diagnosis present

## 2017-02-14 DIAGNOSIS — N400 Enlarged prostate without lower urinary tract symptoms: Secondary | ICD-10-CM | POA: Diagnosis present

## 2017-02-14 DIAGNOSIS — N401 Enlarged prostate with lower urinary tract symptoms: Secondary | ICD-10-CM | POA: Diagnosis not present

## 2017-02-14 DIAGNOSIS — E1151 Type 2 diabetes mellitus with diabetic peripheral angiopathy without gangrene: Secondary | ICD-10-CM | POA: Diagnosis present

## 2017-02-14 DIAGNOSIS — I252 Old myocardial infarction: Secondary | ICD-10-CM

## 2017-02-14 DIAGNOSIS — Z951 Presence of aortocoronary bypass graft: Secondary | ICD-10-CM | POA: Diagnosis not present

## 2017-02-14 DIAGNOSIS — Z9841 Cataract extraction status, right eye: Secondary | ICD-10-CM

## 2017-02-14 DIAGNOSIS — R7303 Prediabetes: Secondary | ICD-10-CM | POA: Diagnosis not present

## 2017-02-14 DIAGNOSIS — Z4889 Encounter for other specified surgical aftercare: Secondary | ICD-10-CM | POA: Diagnosis not present

## 2017-02-14 DIAGNOSIS — K219 Gastro-esophageal reflux disease without esophagitis: Secondary | ICD-10-CM | POA: Diagnosis not present

## 2017-02-14 DIAGNOSIS — E11621 Type 2 diabetes mellitus with foot ulcer: Secondary | ICD-10-CM | POA: Diagnosis present

## 2017-02-14 DIAGNOSIS — R338 Other retention of urine: Secondary | ICD-10-CM | POA: Diagnosis not present

## 2017-02-14 DIAGNOSIS — L97519 Non-pressure chronic ulcer of other part of right foot with unspecified severity: Secondary | ICD-10-CM | POA: Diagnosis present

## 2017-02-14 DIAGNOSIS — E1140 Type 2 diabetes mellitus with diabetic neuropathy, unspecified: Secondary | ICD-10-CM | POA: Diagnosis present

## 2017-02-14 DIAGNOSIS — M869 Osteomyelitis, unspecified: Secondary | ICD-10-CM | POA: Diagnosis not present

## 2017-02-14 DIAGNOSIS — Z6825 Body mass index (BMI) 25.0-25.9, adult: Secondary | ICD-10-CM

## 2017-02-14 DIAGNOSIS — L989 Disorder of the skin and subcutaneous tissue, unspecified: Secondary | ICD-10-CM | POA: Diagnosis not present

## 2017-02-14 DIAGNOSIS — M6281 Muscle weakness (generalized): Secondary | ICD-10-CM | POA: Diagnosis not present

## 2017-02-14 DIAGNOSIS — S91101A Unspecified open wound of right great toe without damage to nail, initial encounter: Secondary | ICD-10-CM | POA: Diagnosis not present

## 2017-02-14 DIAGNOSIS — R278 Other lack of coordination: Secondary | ICD-10-CM | POA: Diagnosis not present

## 2017-02-14 DIAGNOSIS — S98119A Complete traumatic amputation of unspecified great toe, initial encounter: Secondary | ICD-10-CM | POA: Diagnosis not present

## 2017-02-14 LAB — BASIC METABOLIC PANEL
ANION GAP: 4 — AB (ref 5–15)
BUN: 19 mg/dL (ref 6–20)
CO2: 26 mmol/L (ref 22–32)
Calcium: 8.6 mg/dL — ABNORMAL LOW (ref 8.9–10.3)
Chloride: 111 mmol/L (ref 101–111)
Creatinine, Ser: 1.05 mg/dL (ref 0.61–1.24)
GFR calc Af Amer: >60 mL/min (ref >60)
GFR, EST NON AFRICAN AMERICAN: 58 mL/min — AB (ref >60)
GLUCOSE: 124 mg/dL — AB (ref 65–99)
Potassium: 3.9 mmol/L (ref 3.5–5.1)
Sodium: 141 mmol/L (ref 135–145)

## 2017-02-14 LAB — CBC WITH DIFFERENTIAL/PLATELET
BASOS ABS: 0 10*3/uL (ref 0.0–0.1)
Basophils Relative: 0 %
EOS PCT: 4 %
Eosinophils Absolute: 0.4 10*3/uL (ref 0.0–0.7)
HEMATOCRIT: 37.7 % — AB (ref 39.0–52.0)
Hemoglobin: 12.2 g/dL — ABNORMAL LOW (ref 13.0–17.0)
LYMPHS ABS: 1.7 10*3/uL (ref 0.7–4.0)
LYMPHS PCT: 17 %
MCH: 29.5 pg (ref 26.0–34.0)
MCHC: 32.4 g/dL (ref 30.0–36.0)
MCV: 91.1 fL (ref 78.0–100.0)
MONO ABS: 0.9 10*3/uL (ref 0.1–1.0)
Monocytes Relative: 9 %
NEUTROS ABS: 6.9 10*3/uL (ref 1.7–7.7)
Neutrophils Relative %: 70 %
PLATELETS: 235 10*3/uL (ref 150–400)
RBC: 4.14 MIL/uL — AB (ref 4.22–5.81)
RDW: 14 % (ref 11.5–15.5)
WBC: 9.9 10*3/uL (ref 4.0–10.5)

## 2017-02-14 LAB — TROPONIN I

## 2017-02-14 LAB — C-REACTIVE PROTEIN: CRP: 4.3 mg/dL — ABNORMAL HIGH (ref <1.0)

## 2017-02-14 LAB — I-STAT CG4 LACTIC ACID, ED
LACTIC ACID, VENOUS: 1.44 mmol/L (ref 0.5–1.9)
Lactic Acid, Venous: 1.34 mmol/L (ref 0.5–1.9)

## 2017-02-14 LAB — GLUCOSE, CAPILLARY: GLUCOSE-CAPILLARY: 92 mg/dL (ref 65–99)

## 2017-02-14 LAB — SEDIMENTATION RATE: Sed Rate: 27 mm/hr — ABNORMAL HIGH (ref 0–16)

## 2017-02-14 MED ORDER — VANCOMYCIN HCL 10 G IV SOLR
1250.0000 mg | INTRAVENOUS | Status: DC
  Administered 2017-02-14: 1250 mg via INTRAVENOUS
  Filled 2017-02-14: qty 1250

## 2017-02-14 MED ORDER — PIPERACILLIN-TAZOBACTAM 3.375 G IVPB 30 MIN
3.3750 g | Freq: Once | INTRAVENOUS | Status: AC
  Administered 2017-02-14: 3.375 g via INTRAVENOUS

## 2017-02-14 MED ORDER — POTASSIUM CHLORIDE CRYS ER 20 MEQ PO TBCR
20.0000 meq | EXTENDED_RELEASE_TABLET | Freq: Every day | ORAL | Status: DC
  Administered 2017-02-15 – 2017-02-20 (×5): 20 meq via ORAL
  Filled 2017-02-14 (×5): qty 1

## 2017-02-14 MED ORDER — ASPIRIN EC 81 MG PO TBEC
81.0000 mg | DELAYED_RELEASE_TABLET | Freq: Every day | ORAL | Status: DC
  Administered 2017-02-15 – 2017-02-20 (×5): 81 mg via ORAL
  Filled 2017-02-14 (×5): qty 1

## 2017-02-14 MED ORDER — SODIUM CHLORIDE 0.9 % IV SOLN
INTRAVENOUS | Status: AC
  Administered 2017-02-14: via INTRAVENOUS

## 2017-02-14 MED ORDER — HYDRALAZINE HCL 20 MG/ML IJ SOLN: 10.0000 mg | Freq: Three times a day (TID) | INTRAMUSCULAR | Status: DC | PRN

## 2017-02-14 MED ORDER — ACETAMINOPHEN 325 MG PO TABS
650.0000 mg | ORAL_TABLET | Freq: Four times a day (QID) | ORAL | Status: DC | PRN
Start: 2017-02-14 — End: 2017-02-19

## 2017-02-14 MED ORDER — INSULIN ASPART 100 UNIT/ML ~~LOC~~ SOLN: 0.0000 [IU] | Freq: Every day | SUBCUTANEOUS | Status: DC

## 2017-02-14 MED ORDER — DOCUSATE SODIUM 100 MG PO CAPS: 100.0000 mg | ORAL_CAPSULE | Freq: Two times a day (BID) | ORAL | Status: DC | PRN

## 2017-02-14 MED ORDER — ADULT MULTIVITAMIN W/MINERALS CH
1.0000 | ORAL_TABLET | Freq: Every day | ORAL | Status: DC
  Administered 2017-02-15 – 2017-02-20 (×5): 1 via ORAL
  Filled 2017-02-14 (×5): qty 1

## 2017-02-14 MED ORDER — BARRIER CREAM NON-SPECIFIED
1.0000 "application " | TOPICAL_CREAM | Freq: Two times a day (BID) | TOPICAL | Status: DC
  Administered 2017-02-15 – 2017-02-20 (×10): 1 via TOPICAL
  Filled 2017-02-14: qty 1

## 2017-02-14 MED ORDER — SODIUM CHLORIDE 0.9% FLUSH: 3.0000 mL | INTRAVENOUS | Status: DC | PRN

## 2017-02-14 MED ORDER — HEPARIN SODIUM (PORCINE) 5000 UNIT/ML IJ SOLN
5000.0000 [IU] | Freq: Three times a day (TID) | INTRAMUSCULAR | Status: DC
  Administered 2017-02-15 – 2017-02-20 (×16): 5000 [IU] via SUBCUTANEOUS
  Filled 2017-02-14 (×16): qty 1

## 2017-02-14 MED ORDER — ONDANSETRON HCL 4 MG PO TABS: 4.0000 mg | ORAL_TABLET | Freq: Four times a day (QID) | ORAL | Status: DC | PRN

## 2017-02-14 MED ORDER — INSULIN ASPART 100 UNIT/ML ~~LOC~~ SOLN
0.0000 [IU] | Freq: Three times a day (TID) | SUBCUTANEOUS | Status: DC
  Administered 2017-02-15 – 2017-02-20 (×2): 1 [IU] via SUBCUTANEOUS

## 2017-02-14 MED ORDER — POLYVINYL ALCOHOL 1.4 % OP SOLN
2.0000 [drp] | Freq: Four times a day (QID) | OPHTHALMIC | Status: DC
  Administered 2017-02-15 – 2017-02-20 (×21): 2 [drp] via OPHTHALMIC
  Filled 2017-02-14: qty 15

## 2017-02-14 MED ORDER — SODIUM CHLORIDE 0.9 % IV SOLN: 250.0000 mL | INTRAVENOUS | Status: DC | PRN

## 2017-02-14 MED ORDER — PANTOPRAZOLE SODIUM 40 MG PO TBEC
40.0000 mg | DELAYED_RELEASE_TABLET | Freq: Every day | ORAL | Status: DC
  Administered 2017-02-15 – 2017-02-20 (×5): 40 mg via ORAL
  Filled 2017-02-14 (×5): qty 1

## 2017-02-14 MED ORDER — PIPERACILLIN-TAZOBACTAM 3.375 G IVPB
3.3750 g | Freq: Three times a day (TID) | INTRAVENOUS | Status: DC
  Administered 2017-02-15 – 2017-02-19 (×13): 3.375 g via INTRAVENOUS
  Filled 2017-02-14 (×19): qty 50

## 2017-02-14 MED ORDER — SODIUM CHLORIDE 0.9% FLUSH
3.0000 mL | Freq: Two times a day (BID) | INTRAVENOUS | Status: DC
  Administered 2017-02-15 – 2017-02-20 (×11): 3 mL via INTRAVENOUS

## 2017-02-14 MED ORDER — ATORVASTATIN CALCIUM 10 MG PO TABS
5.0000 mg | ORAL_TABLET | Freq: Every day | ORAL | Status: DC
  Administered 2017-02-15 – 2017-02-18 (×5): 5 mg via ORAL
  Filled 2017-02-14 (×6): qty 1

## 2017-02-14 MED ORDER — OXYCODONE HCL 5 MG PO TABS
5.0000 mg | ORAL_TABLET | Freq: Four times a day (QID) | ORAL | Status: DC | PRN
  Administered 2017-02-17: 5 mg via ORAL
  Filled 2017-02-14: qty 1

## 2017-02-14 MED ORDER — ONDANSETRON HCL 4 MG/2ML IJ SOLN: 4.0000 mg | Freq: Four times a day (QID) | INTRAMUSCULAR | Status: DC | PRN

## 2017-02-14 NOTE — ED Provider Notes (Signed)
[facility] DEPT Provider Note   CSN: [id number] Arrival date & time: 02/14/17  1502     History   Chief Complaint Chief Complaint  Patient presents with  . Wound Infection    HPI Ryan Pacheco is a 83 y.o. male.  HPI  83 yo M with PMHx below including CKD, HTN, DM, h/o MI here with lower leg pain and swelling. Pt hit his left shin on the wheelchair 2 months ago and since then, has had a mild, painful, red rash to this area. This has been dressed by home health/nursing. Two weeks ago, however, he hit his right second toe while in the wheelchair. He had an open wound to his toe that has since become increasingly painful, red, and has begun draining yellow purulence. No fever or chills. No vomiting or diarrhea. He has a constant aching, throbbing pain in his right foot. Worse with movement and palpation.  Past Medical History:  Diagnosis Date  . Acid reflux   . Angina   . Arthritis 01-09-12   hands, foot  . Benign prostatic hypertrophy   . Bradycardia   . Chronic kidney disease    Stage 1  through Stage 4 of unspecified chronic kidney disease.  . Diabetes mellitus   . Edema   . Embolism - blood clot ~ 2000   "behind left knee"  . Foley catheter in place    at present to leg bag  . Hearing impaired person, bilateral    right ear hearing aid-"loss other"  . History of asbestos exposure    "have some lung disease from years of exposure"  . Impaired gait and mobility    ambulates with walker- prone to frequent falls  . Myocardial infarction [facility]) 11/17/10    NSTEMI- Dr. Irish Lack follows  . Prostate hypertrophy 01-09-12   urinary retention-Foley catheter at present; surgery planned    Patient Active Problem List   Diagnosis Date Noted  . Acute renal failure ([place]) 05/19/2016  . Bladder outlet obstruction 05/19/2016  . Hyperkalemia 05/19/2016  . Frequent falls 05/19/2016  . Mixed hyperlipidemia 11/10/2013  . Old myocardial infarction 11/10/2013  . Edema 11/10/2013    . Postsurgical aortocoronary bypass status 02/06/2012  . Coronary atherosclerosis 02/06/2012    Past Surgical History:  Procedure Laterality Date  . BLEPHAROPLASTY     left eye  . CARDIAC CATHETERIZATION  01-09-12   1'13  . CATARACT EXTRACTION, BILATERAL  ~ 2010  . CHOLECYSTECTOMY  1980's  . CORONARY ARTERY BYPASS GRAFT  11/26/2011   Procedure:x4- CORONARY ARTERY BYPASS GRAFTING (CABG);  Surgeon: Gaye Pollack, MD;  Location: [facility];  Service: Open Heart Surgery;  Laterality: N/A;  . INSERTION OF SUPRAPUBIC CATHETER N/A 08/05/2016   Procedure: CYSTOSCOPY AND INSERTION OF SUPRAPUBIC CATHETER;  Surgeon: Carolan Clines, MD;  Location: [facility];  Service: Urology;  Laterality: N/A;  . LEFT HEART CATHETERIZATION WITH CORONARY ANGIOGRAM N/A 11/20/2011   Procedure: LEFT HEART CATHETERIZATION WITH CORONARY ANGIOGRAM;  Surgeon: Jettie Booze, MD;  Location: [facility] CATH LAB;  Service: Cardiovascular;  Laterality: N/A;  possible PCI  . Suprapubic tube     3'13, then removed.       Home Medications    Prior to Admission medications   Medication Sig Start Date End Date Taking? Authorizing Provider  acetaminophen (TYLENOL) 325 MG tablet Take 650 mg by mouth every 6 (six) hours as needed for mild pain.    Historical Provider, MD  aspirin EC 81 MG tablet Take  81 mg by mouth daily.    Historical Provider, MD  atorvastatin (LIPITOR) 10 MG tablet Take 5 mg by mouth daily at 8 pm.     Historical Provider, MD  dextromethorphan (DELSYM) 30 MG/5ML liquid Take 60 mg by mouth 2 (two) times daily as needed for cough.    Historical Provider, MD  docusate sodium (COLACE) 100 MG capsule Take 100 mg by mouth 2 (two) times daily.    Historical Provider, MD  finasteride (PROSCAR) 5 MG tablet Take 5 mg by mouth daily.    Historical Provider, MD  furosemide (LASIX) 40 MG tablet Take 40 mg by mouth daily after breakfast. May take extra dose of Lasix up to 2 times weekly for edema.    Historical Provider, MD   guaiFENesin (MUCINEX) 600 MG 12 hr tablet Take 600 mg by mouth 2 (two) times daily.    Historical Provider, MD  methylcellulose (ARTIFICIAL TEARS) 1 % ophthalmic solution Place 2 drops into both eyes 4 (four) times daily.    Historical Provider, MD  Multiple Vitamin (MULITIVITAMIN WITH MINERALS) TABS Take 1 tablet by mouth daily.    Historical Provider, MD  nystatin (NYSTATIN) powder Apply 1 g topically 2 (two) times daily.    Historical Provider, MD  pantoprazole (PROTONIX) 40 MG tablet Take 40 mg by mouth daily.     Historical Provider, MD  potassium chloride SA (K-DUR,KLOR-CON) 20 MEQ tablet Take 20 mEq by mouth daily.     Historical Provider, MD  Skin Protectants, Misc. (DIMETHICONE-ZINC OXIDE) cream Apply 1 application topically 2 (two) times daily. BAZA PROTECT CREAM-apply skin prep to reddened area; apply to protect from moisture.    Historical Provider, MD  terbinafine (LAMISIL) 1 % cream Apply 1 application topically 2 (two) times daily. 1 application to rash twice daily    Historical Provider, MD    Family History Family History  Problem Relation Age of Onset  . Heart attack Father   . Hypertension Neg Hx   . Stroke Neg Hx     Social History Social History  Substance Use Topics  . Smoking status: Former Smoker    Types: Cigars  . Smokeless tobacco: Former Systems developer    Types: Chew     Comment: "hadn't used chew or smoked cigars since 1990's  . Alcohol use No     Allergies   Patient has no known allergies.   Review of Systems Review of Systems  Constitutional: Positive for fatigue. Negative for chills and fever.  HENT: Negative for congestion and rhinorrhea.   Eyes: Negative for visual disturbance.  Respiratory: Negative for cough, shortness of breath and wheezing.   Cardiovascular: Negative for chest pain and leg swelling.  Gastrointestinal: Negative for abdominal pain, diarrhea, nausea and vomiting.  Genitourinary: Negative for dysuria and flank pain.   Musculoskeletal: Positive for gait problem. Negative for neck pain and neck stiffness.  Skin: Positive for rash and wound.  Allergic/Immunologic: Negative for immunocompromised state.  Neurological: Negative for syncope, weakness and headaches.  All other systems reviewed and are negative.    Physical Exam Updated Vital Signs BP (!) 146/62 (BP Location: Right Arm)   Pulse (!) 40   Temp 97.3 F (36.3 C) (Oral)   Resp 18   SpO2 96%   Physical Exam  Constitutional: He is oriented to person, place, and time. He appears well-developed and well-nourished. No distress.  HENT:  Head: Normocephalic and atraumatic.  Eyes: Conjunctivae are normal.  Bilateral conjunctival injection with mild lower lid  edema  Neck: Neck supple.  Cardiovascular: Normal rate, regular rhythm and normal heart sounds.  Exam reveals no friction rub.   No murmur heard. Pulmonary/Chest: Effort normal and breath sounds normal. No respiratory distress. He has no wheezes. He has no rales.  Abdominal: Soft. He exhibits no distension.  Musculoskeletal: He exhibits no edema.  Neurological: He is alert and oriented to person, place, and time. He exhibits normal muscle tone.  Skin: Skin is warm. Capillary refill takes less than 2 seconds.  Psychiatric: He has a normal mood and affect.  Nursing note and vitals reviewed.   LOWER EXTREMITY EXAM: BILATERAL  INSPECTION & PALPATION: Left shin with diffuse, excoriated wound with mild erythema, hyperpigmentation but no warmth. No drainage. Right second toe with open, purulent wound overlying IP joint with streaking, warm erythema. No bony deformity.  SENSORY: sensation is intact to light touch in:  Superficial peroneal nerve distribution (over dorsum of foot) Deep peroneal nerve distribution (over first dorsal web space) Sural nerve distribution (over lateral aspect 5th metatarsal) Saphenous nerve distribution (over medial instep)  MOTOR:  + Motor EHL (great toe  dorsiflexion) + FHL (great toe plantar flexion)  + TA (ankle dorsiflexion)  + GSC (ankle plantar flexion)  VASCULAR: 2+ dorsalis pedis and posterior tibialis pulses Capillary refill < 2 sec, toes warm and well-perfused  COMPARTMENTS: Soft, warm, well-perfused No pain with passive extension No parethesias        ED Treatments / Results  Labs (all labs ordered are listed, but only abnormal results are displayed) Labs Reviewed  CBC WITH DIFFERENTIAL/PLATELET  BASIC METABOLIC PANEL  I-STAT CG4 LACTIC ACID, ED    EKG  EKG Interpretation None       Radiology No results found.  Procedures Procedures (including critical care time)  Medications Ordered in ED Medications - No data to display   Initial Impression / Assessment and Plan / ED Course  I have reviewed the triage vital signs and the nursing notes.  Pertinent labs & imaging results that were available during my care of the patient were reviewed by me and considered in my medical decision making (see chart for details).     83 yo M with PMHx as above here with open, draining wound to left second toe. Plain films c/f osteo and septic arthritis. Pt afebrile, WBC normal - do not suspect sepsis but c/f significant cellulitis and osteo. ESR,CRP added on. Broad-spectrum ABX started. Admit to medicine. Dw Dr. Erlinda Hong, Ortho, who recommends transfer to [facility]. Pt updated and in agreement.  Final Clinical Impressions(s) / ED Diagnoses   Final diagnoses:  Other acute osteomyelitis of right foot (HCC)     Duffy Bruce, MD 02/15/17 [id number]

## 2017-02-14 NOTE — Progress Notes (Signed)
Pharmacy Antibiotic Note  Ryan Pacheco is a 83 y.o. male facility resident admitted on 02/14/2017 with several recent superficial wounds that have become erythematous and/or purulent.  Pharmacy has been consulted for vanc and Zosyn dosing.  Plan:  Vancomycin 1250 mg IV q24 hr; goal trough 10-15 mcg/mL  Measure vancomycin trough levels at steady state as indicated  Zosyn 3.375 g IV given once over 30 minutes, then every 8 hrs by 4-hr infusion    Temp (24hrs), Avg:97.3 F (36.3 C), Min:97.3 F (36.3 C), Max:97.3 F (36.3 C)   Recent Labs Lab 02/14/17 1606 02/14/17 1617  WBC 9.9  --   CREATININE 1.05  --   LATICACIDVEN  --  1.34    CrCl cannot be calculated (Unknown ideal weight.).    No Known Allergies   Thank you for allowing pharmacy to be a part of this patient's care.  Reuel Boom, PharmD, BCPS Pager: [phone] 02/14/2017, 5:36 PM

## 2017-02-14 NOTE — H&P (Signed)
History and Physical    Ryan Pacheco:[id number] DOB: Jun 18, 1920 DOA: 02/14/2017  PCP: [name], MD   I have briefly reviewed patients previous medical reports in Ryan Pacheco.  Patient coming from: From SNF Uses walker and wheelchair at baseline   Chief Complaint: right foot pain and open wound   HPI: Ryan Pacheco is a 83 y/o male with PMH significant for HTN, DM, stage 3 CKD, and BPH (s/p suprapubic catheter); who presented to ED secondary to no healing wound on his right foot. Patient endorses experiencing traumatic injury with his wheelchair couple weeks ago on his right foot. No attention never pursuit. With time wound has worsen, no with erythema, swelling, drainage and essentially complete open erosion. Patient is experiencing intermittent throbbing pain and discomfort. No fever, chills, CP, SOB, nausea, vomiting, dysuria or any other complaints. Due to patient's daughter insistence he came to ED to be evaluated. Found to have cellulitis and as mentioned below images done also suggesting osteomyelitis.  ED Course: cultures taken, x-ray done to patient foot demonstrating osteomyelitis. Ortho consulted and recommended transfer to [facility]. Patient started on vanc and zosyn.   Review of Systems:  All other systems reviewed and apart from HPI, are negative.  Past Medical History:  Diagnosis Date  . Acid reflux   . Angina   . Arthritis 01-09-12   hands, foot  . Benign prostatic hypertrophy   . Bradycardia   . Chronic kidney disease    Stage 1  through Stage 4 of unspecified chronic kidney disease.  . Diabetes mellitus   . Edema   . Embolism - blood clot ~ 2000   "behind left knee"  . Foley catheter in place    at present to leg bag  . Hearing impaired person, bilateral    right ear hearing aid-"loss other"  . History of asbestos exposure    "have some lung disease from years of exposure"  . Impaired gait and mobility    ambulates with walker- prone to  frequent falls  . Myocardial infarction [facility]) 11/17/10    NSTEMI- Dr. Irish Lack follows  . Prostate hypertrophy 01-09-12   urinary retention-Foley catheter at present; surgery planned    Past Surgical History:  Procedure Laterality Date  . BLEPHAROPLASTY     left eye  . CARDIAC CATHETERIZATION  01-09-12   1'13  . CATARACT EXTRACTION, BILATERAL  ~ 2010  . CHOLECYSTECTOMY  1980's  . CORONARY ARTERY BYPASS GRAFT  11/26/2011   Procedure:x4- CORONARY ARTERY BYPASS GRAFTING (CABG);  Surgeon: Gaye Pollack, MD;  Location: [place];  Service: Open Heart Surgery;  Laterality: N/A;  . INSERTION OF SUPRAPUBIC CATHETER N/A 08/05/2016   Procedure: CYSTOSCOPY AND INSERTION OF SUPRAPUBIC CATHETER;  Surgeon: Carolan Clines, MD;  Location: [facility];  Service: Urology;  Laterality: N/A;  . LEFT HEART CATHETERIZATION WITH CORONARY ANGIOGRAM N/A 11/20/2011   Procedure: LEFT HEART CATHETERIZATION WITH CORONARY ANGIOGRAM;  Surgeon: Jettie Booze, MD;  Location: [facility] CATH LAB;  Service: Cardiovascular;  Laterality: N/A;  possible PCI  . Suprapubic tube     3'13, then removed.    Social History  reports that he has quit smoking. His smoking use included Cigars. He has quit using smokeless tobacco. His smokeless tobacco use included Chew. He reports that he does not drink alcohol or use drugs.  No Known Allergies  Family History  Problem Relation Age of Onset  . Heart attack Father   . Hypertension Neg Hx   .  Stroke Neg Hx      Prior to Admission medications   Medication Sig Start Date End Date Taking? Authorizing Provider  aspirin EC 81 MG tablet Take 81 mg by mouth daily.   Yes Historical Provider, MD  atorvastatin (LIPITOR) 10 MG tablet Take 5 mg by mouth daily at 8 pm.    Yes Historical Provider, MD  docusate sodium (COLACE) 100 MG capsule Take 100 mg by mouth 2 (two) times daily.   Yes Historical Provider, MD  guaiFENesin (MUCINEX) 600 MG 12 hr tablet Take 600 mg by mouth 2 (two) times daily.    Yes Historical Provider, MD  methylcellulose (ARTIFICIAL TEARS) 1 % ophthalmic solution Place 2 drops into both eyes 4 (four) times daily.   Yes Historical Provider, MD  Multiple Vitamin (MULITIVITAMIN WITH MINERALS) TABS Take 1 tablet by mouth daily.   Yes Historical Provider, MD  nystatin (NYSTATIN) powder Apply 1 g topically 2 (two) times daily.   Yes Historical Provider, MD  pantoprazole (PROTONIX) 40 MG tablet Take 40 mg by mouth daily.    Yes Historical Provider, MD  potassium chloride SA (K-DUR,KLOR-CON) 20 MEQ tablet Take 20 mEq by mouth daily.    Yes Historical Provider, MD  Skin Protectants, Misc. (DIMETHICONE-ZINC OXIDE) cream Apply 1 application topically 2 (two) times daily. BAZA PROTECT CREAM-apply skin prep to reddened area; apply to protect from moisture.   Yes Historical Provider, MD  acetaminophen (TYLENOL) 325 MG tablet Take 650 mg by mouth every 6 (six) hours as needed for mild pain.    Historical Provider, MD  dextromethorphan (DELSYM) 30 MG/5ML liquid Take 60 mg by mouth 2 (two) times daily as needed for cough.    Historical Provider, MD    Physical Exam: Vitals:   02/14/17 1522 02/14/17 1812  BP: (!) 146/62 (!) 152/65  Pulse: (!) 40 63  Resp: 18 18  Temp: 97.3 F (36.3 C)   TempSrc: Oral   SpO2: 96% 97%   Constitutional: afebrile, in no acute distress, denies CP and SOB. Patient with main complaint of mild throbbing right foot discomfort.  Eyes: PERTLA, chronic erythematous lower lids bilaterally, no icterus ENMT: Mucous membranes are moist. Poor dentition and missing teeth; no thrush, no exudates appreciated. Patient is hard of hearing, but wearing hearing aid.  Neck: supple, no masses, no thyromegaly, no JVD Respiratory: clear to auscultation bilaterally, no wheezing, no crackles. Normal respiratory effort. No accessory muscle use.  Cardiovascular: S1 & S2 heard, regular rate and rhythm, positive sEM, no rubs no gallops.  Abdomen: No distension, no tenderness, no  masses palpated. No hepatosplenomegaly. Bowel sounds normal.  Musculoskeletal: no clubbing / cyanosis. Patient with trace to 1 + pedal edema on his right foot. Skin: patient with open second toe wound on right foot and spreading erythema to dorsal aspect of his foot. Also with Left leg wound of what appears PVD and venous stasis, no superimposed infection appreciated on that one. Neurologic: CN 2-12 grossly intact. Sensation intact, DTR normal. Strength 4/5 in LE bilaterally; 5/5 upper extremities bilaterally. No focal deficit.  Psychiatric: Normal judgment and insight. Alert and oriented x 3. Normal mood.   Labs on Admission: I have personally reviewed following labs and imaging studies  CBC:  Recent Labs Lab 02/14/17 1606  WBC 9.9  NEUTROABS 6.9  HGB 12.2*  HCT 37.7*  MCV 91.1  PLT 220   Basic Metabolic Panel:  Recent Labs Lab 02/14/17 1606  NA 141  K 3.9  CL 111  CO2 26  GLUCOSE 124*  BUN 19  CREATININE 1.05  CALCIUM 8.6*   Urine analysis:    Component Value Date/Time   COLORURINE YELLOW 11/21/2016 1130   APPEARANCEUR CLOUDY (A) 11/21/2016 1130   LABSPEC 1.016 11/21/2016 1130   PHURINE 7.0 11/21/2016 1130   GLUCOSEU NEGATIVE 11/21/2016 1130   HGBUR NEGATIVE 11/21/2016 1130   BILIRUBINUR NEGATIVE 11/21/2016 1130   KETONESUR NEGATIVE 11/21/2016 1130   PROTEINUR NEGATIVE 11/21/2016 1130   UROBILINOGEN 1.0 12/01/2011 0835   NITRITE POSITIVE (A) 11/21/2016 1130   LEUKOCYTESUR LARGE (A) 11/21/2016 1130    Radiological Exams on Admission: Dg Tibia/fibula Left  Result Date: 02/14/2017 CLINICAL DATA:  Left lower extremity wound. EXAM: LEFT TIBIA AND FIBULA - 2 VIEW COMPARISON:  None. FINDINGS: The knee and ankle joints are maintained. No acute bony findings or destructive bony changes. Small soft tissue wound noted involving the midshaft region on the leg anteriorly. Vascular calcifications are noted. IMPRESSION: No acute bony findings. Electronically Signed   By: Marijo Sanes M.D.   On: 02/14/2017 16:50   Dg Foot Complete Right  Result Date: 02/14/2017 CLINICAL DATA:  Open wounds involving the first and second toes. EXAM: RIGHT FOOT COMPLETE - 3+ VIEW COMPARISON:  None. FINDINGS: Destructive bony changes involving the second toe with findings consistent with septic arthritis at the PIP joint and osteomyelitis involving the adjacent proximal and middle phalanges. Degenerative changes at the first metatarsal phalangeal joint. No definite findings for osteomyelitis. Could not exclude fractures of the fourth and fifth proximal phalanges. The metatarsals are intact. IMPRESSION: Plain film findings consistent with septic arthritis involving the PIP joint of the second toe with adjacent osteomyelitis involving the proximal and middle phalanges. Possible fractures of the fourth and fifth proximal phalanges. Electronically Signed   By: Marijo Sanes M.D.   On: 02/14/2017 16:48    EKG:  None at time of admission. EKG ordered.  Assessment/Plan 1-Acute osteomyelitis of toe of right foot ([place]): with streak cellulitis appreciated on the dorsal aspect of his foot. -will admit to [facility] as per orthopedic request -will check blood cx's, ESR and CRP -patient started on vanc and zosyn per pharmacy  -PRN analgesics and antipyretics -no elevation of lactic acid and no signs of sepsis -patient might required surgical debridement vs toe amputation  -will check EKG and troponin -mild to moderate risk for surgery; no further work up needed at this time for surgical clearance.  2-Mixed hyperlipidemia -will continue statins   3-BPH (benign prostatic hyperplasia) -stable and no complaining of retention at this time -chronic foley in place   4-HTN (hypertension) -currently treated with diet control -not on any antihypertensive agent at home -will use PRN hydralazine -heart healthy diet ordered   5-Type 2 diabetes with nephropathy ([place]) -follow by diet control -will use SSI  while inpatient -will check A1C  6-CKD (chronic kidney disease), stage III  -stable and at baseline  -will monitor trend  -pharmacy assisting with medications dose adjustments  7-left leg chronic wound: -appears to be secondary to PVD -no signs of superimposed infection -Will ask wound care service to follow patient   8-moderate protein calorie malnutrition  -nutritional service consulted    Time: 50 minutes   DVT prophylaxis: Heparin   Code Status: DNR Family Communication: no family at bedside   Disposition Plan: will transfer to [facility], patient will need surgical debridement and possible amputation of his 2nd toe right foot due to osteomyelitis.  Consults called: orthopedic surgery (Dr.  Xu)  Admission status: inpatient, med-surg bed; LOS > 2 midnights     Barton Dubois MD [facility] Pager [phone]  If 7PM-7AM, please contact night-coverage [email or web address] Password [facility]  02/14/2017, 6:59 PM

## 2017-02-14 NOTE — ED Triage Notes (Signed)
[facility] for wounds to left shin and right great/index toes. Staff state wounds to toes are getting progressively worse. Pt denies pain at this time. Pt alert oriented x 4  Temp 97.2 HR ranged 42-64 BP 190/80 RR 28-30 SpO2 98 CBG 126

## 2017-02-15 DIAGNOSIS — M86171 Other acute osteomyelitis, right ankle and foot: Secondary | ICD-10-CM

## 2017-02-15 LAB — CBC
HEMATOCRIT: 36.8 % — AB (ref 39.0–52.0)
HEMOGLOBIN: 12 g/dL — AB (ref 13.0–17.0)
MCH: 30.2 pg (ref 26.0–34.0)
MCHC: 32.6 g/dL (ref 30.0–36.0)
MCV: 92.5 fL (ref 78.0–100.0)
Platelets: 234 10*3/uL (ref 150–400)
RBC: 3.98 MIL/uL — AB (ref 4.22–5.81)
RDW: 13.8 % (ref 11.5–15.5)
WBC: 9.5 10*3/uL (ref 4.0–10.5)

## 2017-02-15 LAB — GLUCOSE, CAPILLARY
GLUCOSE-CAPILLARY: 101 mg/dL — AB (ref 65–99)
Glucose-Capillary: 88 mg/dL (ref 65–99)
Glucose-Capillary: 132 mg/dL — ABNORMAL HIGH (ref 65–99)
Glucose-Capillary: 106 mg/dL — ABNORMAL HIGH (ref 65–99)

## 2017-02-15 LAB — MAGNESIUM: MAGNESIUM: 2 mg/dL (ref 1.7–2.4)

## 2017-02-15 LAB — HEPATIC FUNCTION PANEL
ALT: 19 U/L (ref 17–63)
AST: 18 U/L (ref 15–41)
Albumin: 3 g/dL — ABNORMAL LOW (ref 3.5–5.0)
Alkaline Phosphatase: 64 U/L (ref 38–126)
BILIRUBIN DIRECT: 0.2 mg/dL (ref 0.1–0.5)
Indirect Bilirubin: 0.5 mg/dL (ref 0.3–0.9)
Total Bilirubin: 0.7 mg/dL (ref 0.3–1.2)
Total Protein: 6 g/dL — ABNORMAL LOW (ref 6.5–8.1)

## 2017-02-15 LAB — BASIC METABOLIC PANEL
ANION GAP: 12 (ref 5–15)
BUN: 16 mg/dL (ref 6–20)
CALCIUM: 8.9 mg/dL (ref 8.9–10.3)
CHLORIDE: 109 mmol/L (ref 101–111)
CO2: 24 mmol/L (ref 22–32)
Creatinine, Ser: 1 mg/dL (ref 0.61–1.24)
GFR calc non Af Amer: >60 mL/min (ref >60)
GLUCOSE: 103 mg/dL — AB (ref 65–99)
Potassium: 4.3 mmol/L (ref 3.5–5.1)
Sodium: 145 mmol/L (ref 135–145)

## 2017-02-15 LAB — PHOSPHORUS: Phosphorus: 3.5 mg/dL (ref 2.5–4.6)

## 2017-02-15 LAB — TSH: TSH: 4.666 u[IU]/mL — AB (ref 0.350–4.500)

## 2017-02-15 MED ORDER — VANCOMYCIN HCL 10 G IV SOLR
1500.0000 mg | INTRAVENOUS | Status: DC
  Administered 2017-02-15 – 2017-02-19 (×5): 1500 mg via INTRAVENOUS
  Filled 2017-02-15 (×5): qty 1500

## 2017-02-15 MED ORDER — PRO-STAT SUGAR FREE PO LIQD
30.0000 mL | Freq: Two times a day (BID) | ORAL | Status: DC
  Administered 2017-02-15 – 2017-02-20 (×9): 30 mL via ORAL
  Filled 2017-02-15 (×10): qty 30

## 2017-02-15 MED ORDER — ENSURE ENLIVE PO LIQD
237.0000 mL | Freq: Two times a day (BID) | ORAL | Status: DC
  Administered 2017-02-15 – 2017-02-17 (×6): 237 mL via ORAL

## 2017-02-15 NOTE — Progress Notes (Signed)
PROGRESS NOTE                                                                                                                                                                                                             Patient Demographics:    Ryan Pacheco, is a 83 y.o. male, DOB - 11/07/1919, TMH:[id number]  Admit date - 02/14/2017   Admitting Physician No admitting provider for patient encounter.  Outpatient Primary MD for the patient is Ryan Austria, MD  LOS - 1  Chief Complaint  Patient presents with  . Wound Infection       Brief Narrative   Ryan Pacheco is a 83 y/o male with PMH significant for HTN, DM, stage 3 CKD, and BPH (s/p suprapubic catheter); who presented to ED secondary to no healing wound on his right foot after experiencing a traumatic injury with his wheelchair couple weeks ago workup showed possible R 2nd toe infection.   Subjective:    Ryan Pacheco today has, No headache, No chest pain, No abdominal pain - No Nausea, No new weakness tingling or numbness, No Cough - SOB. Unreliable historian.   Assessment  & Plan :     1. Traumatic R foot injury with R 2nd Septic arthritis/ Osteomyelitis with multiple toe fractures - high CRP, ESR stable for age, on emp IV ABX, discussed with Dr Ryan Pacheco he will see the patient shortly, will be a moderate  risk candidate for adverse Cardio-pulm outcome during periop period. No Sepsis.  2. Old L.Leg wound - W care.  3. HTN - only PNR hydralazine  4. BPH - chronic indwelling foley.  5. Dyslipidemia - on statin  6. CKD 2 - baseline creat around 1, at baseline.  7. GERD on PPI  8. DM2 - ISS  CBG (last 3)   Recent Labs  02/14/17 2354 02/15/17 0700  GLUCAP 92 88      Diet : Diet heart healthy/carb modified Room service appropriate? Yes; Fluid consistency: Thin    Family Communication  :  Left message on 2 listed phone numbers - 02-15-17 @ 11  am  Code Status :  DNR  Disposition Plan  :  SNF  Consults  :  Ortho  Procedures  :      DVT Prophylaxis  :    Heparin  Lab Results  Component Value Date   PLT 234 02/15/2017    Inpatient Medications  Scheduled Meds: . aspirin EC  81 mg Oral Daily  . atorvastatin  5 mg Oral Q2000  . barrier cream  1 application Topical BID  . feeding supplement (ENSURE ENLIVE)  237 mL Oral BID BM  . feeding supplement (PRO-STAT SUGAR FREE 64)  30 mL Oral BID  . heparin  5,000 Units Subcutaneous Q8H  . insulin aspart  0-5 Units Subcutaneous QHS  . insulin aspart  0-9 Units Subcutaneous TID WC  . multivitamin with minerals  1 tablet Oral Daily  . pantoprazole  40 mg Oral Daily  . polyvinyl alcohol  2 drop Both Eyes QID  . potassium chloride SA  20 mEq Oral Daily  . sodium chloride flush  3 mL Intravenous Q12H   Continuous Infusions: . sodium chloride    . piperacillin-tazobactam (ZOSYN)  IV Stopped (02/15/17 1016)  . vancomycin     PRN Meds:.sodium chloride, acetaminophen, docusate sodium, hydrALAZINE, ondansetron **OR** ondansetron (ZOFRAN) IV, oxyCODONE, sodium chloride flush  Antibiotics  :    Anti-infectives    Start     Dose/Rate Route Frequency Ordered Stop   02/15/17 1700  vancomycin (VANCOCIN) 1,500 mg in sodium chloride 0.9 % 500 mL IVPB     1,500 mg 250 mL/hr over 120 Minutes Intravenous Every 24 hours 02/15/17 1058     02/15/17 0000  piperacillin-tazobactam (ZOSYN) IVPB 3.375 g     3.375 g 12.5 mL/hr over 240 Minutes Intravenous Every 8 hours 02/14/17 1731     02/14/17 1800  vancomycin (VANCOCIN) 1,250 mg in sodium chloride 0.9 % 250 mL IVPB  Status:  Discontinued     1,250 mg 166.7 mL/hr over 90 Minutes Intravenous Every 24 hours 02/14/17 1731 02/15/17 1058   02/14/17 1800  piperacillin-tazobactam (ZOSYN) IVPB 3.375 g     3.375 g 100 mL/hr over 30 Minutes Intravenous  Once 02/14/17 1731 02/14/17 1841         Objective:   Vitals:   02/14/17 1812 02/14/17  2308 02/15/17 0501 02/15/17 1027  BP: (!) 152/65 (!) 155/59 (!) 135/52   Pulse: 63 73 87   Resp: 18     Temp:  97.6 F (36.4 C) 97.6 F (36.4 C)   TempSrc:  Oral Oral   SpO2: 97% 100% 99%   Weight:    72.6 kg (160 lb 0.9 oz)  Height:    '5\' 7"'  (1.702 m)    Wt Readings from Last 3 Encounters:  02/15/17 72.6 kg (160 lb 0.9 oz)  08/05/16 82.1 kg (181 lb)  08/01/16 82.1 kg (181 lb)     Intake/Output Summary (Last 24 hours) at 02/15/17 1102 Last data filed at 02/15/17 0935  Gross per 24 hour  Intake              403 ml  Output                0 ml  Net              403 ml     Physical Exam  Awake but pleasantly confused, No new F.N deficits, Normal affect [place].AT,PERRAL Supple Neck,No JVD, No cervical lymphadenopathy appriciated.  Symmetrical Chest wall movement, Good air movement bilaterally, CTAB RRR,No Gallops,Rubs or new Murmurs, No Parasternal Heave +ve B.Sounds, Abd Soft, No tenderness, No organomegaly appriciated, No rebound - guarding or rigidity. No Cyanosis, Clubbing or edema, No new Rash or bruise  Booth feet and L ankle area under bandage    Data Review:    CBC  Recent Labs Lab 02/14/17 1606 02/15/17 0010  WBC 9.9 9.5  HGB 12.2* 12.0*  HCT 37.7* 36.8*  PLT 235 234  MCV 91.1 92.5  MCH 29.5 30.2  MCHC 32.4 32.6  RDW 14.0 13.8  LYMPHSABS 1.7  --   MONOABS 0.9  --   EOSABS 0.4  --   BASOSABS 0.0  --     Chemistries   Recent Labs Lab 02/14/17 1606 02/15/17 0010  NA 141 145  K 3.9 4.3  CL 111 109  CO2 26 24  GLUCOSE 124* 103*  BUN 19 16  CREATININE 1.05 1.00  CALCIUM 8.6* 8.9  MG  --  2.0  AST  --  18  ALT  --  19  ALKPHOS  --  64  BILITOT  --  0.7   ------------------------------------------------------------------------------------------------------------------ No results for input(s): CHOL, HDL, LDLCALC, TRIG, CHOLHDL, LDLDIRECT in the last 72 hours.  Lab Results  Component Value Date   HGBA1C 5.9 (H) 08/01/2016    ------------------------------------------------------------------------------------------------------------------  Recent Labs  02/15/17 0010  TSH 4.666*   ------------------------------------------------------------------------------------------------------------------ No results for input(s): VITAMINB12, FOLATE, FERRITIN, TIBC, IRON, RETICCTPCT in the last 72 hours.  Coagulation profile No results for input(s): INR, PROTIME in the last 168 hours.  No results for input(s): DDIMER in the last 72 hours.  Cardiac Enzymes  Recent Labs Lab 02/14/17 2059  TROPONINI <0.03   ------------------------------------------------------------------------------------------------------------------    Component Value Date/Time   BNP 363.2 (H) 05/19/2016 1037    Micro Results Recent Results (from the past 240 hour(s))  Blood culture (routine x 2)     Status: None (Preliminary result)   Collection Time: 02/14/17  5:05 PM  Result Value Ref Range Status   Specimen Description RIGHT ANTECUBITAL  Final   Special Requests   Final    BOTTLES DRAWN AEROBIC AND ANAEROBIC Blood Culture adequate volume   Culture   Final    NO GROWTH < 24 HOURS Performed at [facility], [place] [address], [place], [place] [address]    Report Status PENDING  Incomplete  Blood culture (routine x 2)     Status: None (Preliminary result)   Collection Time: 02/14/17  5:10 PM  Result Value Ref Range Status   Specimen Description LEFT ANTECUBITAL  Final   Special Requests IN PEDIATRIC BOTTLE Blood Culture adequate volume  Final   Culture   Final    NO GROWTH < 24 HOURS Performed at [facility], [place] [address] [place], [place] [address]    Report Status PENDING  Incomplete    Radiology Reports Dg Tibia/fibula Left  Result Date: 02/14/2017 CLINICAL DATA:  Left lower extremity wound. EXAM: LEFT TIBIA AND FIBULA - 2 VIEW COMPARISON:  None. FINDINGS: The knee and ankle joints are maintained. No acute  bony findings or destructive bony changes. Small soft tissue wound noted involving the midshaft region on the leg anteriorly. Vascular calcifications are noted. IMPRESSION: No acute bony findings. Electronically Signed   By: Marijo Sanes M.D.   On: 02/14/2017 16:50   Dg Foot Complete Right  Result Date: 02/14/2017 CLINICAL DATA:  Open wounds involving the first and second toes. EXAM: RIGHT FOOT COMPLETE - 3+ VIEW COMPARISON:  None. FINDINGS: Destructive bony changes involving the second toe with findings consistent with septic arthritis at the PIP joint and osteomyelitis involving the adjacent proximal and middle phalanges. Degenerative changes  at the first metatarsal phalangeal joint. No definite findings for osteomyelitis. Could not exclude fractures of the fourth and fifth proximal phalanges. The metatarsals are intact. IMPRESSION: Plain film findings consistent with septic arthritis involving the PIP joint of the second toe with adjacent osteomyelitis involving the proximal and middle phalanges. Possible fractures of the fourth and fifth proximal phalanges. Electronically Signed   By: Marijo Sanes M.D.   On: 02/14/2017 16:48    Time Spent in minutes  30   Lala Lund M.D on 02/15/2017 at 11:02 AM  Between 7am to 7pm - Pager - [phone] ( page via [facility], text pages only, please mention full 10 digit call back number). After 7pm go to [email or web address] - password [facility]

## 2017-02-15 NOTE — Progress Notes (Signed)
Initial Nutrition Assessment  DOCUMENTATION CODES:  Not applicable  INTERVENTION:  Ensure Enlive po BID, each supplement provides 350 kcal and 20 grams of protein  Will order 30 mL Prostat BID, each supplement provides 100 kcal and 15 grams of protein.  MVI with minerals to ensure adequate substrate for wound healing.   NUTRITION DIAGNOSIS:  Increased nutrient needs related to wound healing as evidenced by estimated nutritional requirements for this outcome  GOAL:  Patient will meet greater than or equal to 90% of their needs  MONITOR:  PO intake, Supplement acceptance, Labs, Weight trends  REASON FOR ASSESSMENT:  Consult Assessment of nutrition requirement/status  ASSESSMENT:  83 year old male PMHx HTN, DM, CKD3, BPH, MI. Presented to ED 2/2 non healing wound on R foot that has progressively worsened over the past few weeks. Found to have cellulitis  And imaging suggests osteomyelitis. Admitted for management, may need amputation.   Obtaining history from patient is difficult as he is extremely HOH and not all responses were appropriate. He repeatedly states that he was doing alright at the SNF. Denied any issues with n/v/c/d. He said he had a good appetite.   Today, his bed weight is 160.1 lbs, which is a significant wt loss since ~7 months ago when he was weighed at 181 lbs. Weight loss may not necessarily be due to a poor intake as his severe infection may have grossly increased his requirements.   Noted that admitting MD diagnosed with moderate malnutrition believed to be due to lowered albumin. When a patient presents with low albumin, it is likely skewed due to the acute inflammatory response (Elevated CRP).  Note that low albumin is no longer used to diagnose malnutrition.  Physical Exam-Appears well nourished, especially for age. No discernible signs of depletion.   He says his ht is 5'7", though appears taller  Labs: A1C on 08/01/16 was 5.9, new one pending. BG well  controlled at this time: 90-125, CRP 4.3, Albumin:3.0 Medications: Insulin, ppi, KCL, mvi with min, IV abx   Recent Labs Lab 02/14/17 1606 02/15/17 0010  NA 141 145  K 3.9 4.3  CL 111 109  CO2 26 24  BUN 19 16  CREATININE 1.05 1.00  CALCIUM 8.6* 8.9  MG  --  2.0  PHOS  --  3.5  GLUCOSE 124* 103*   Diet Order:  Diet heart healthy/carb modified Room service appropriate? Yes; Fluid consistency: Thin  Skin: Cellutlitis/DM ulcerTo R toe, Venous stasis ulcer to L leg  Last BM:  4/19  Height:  Ht Readings from Last 1 Encounters:  02/15/17 5\' 7"  (1.702 m)   Weight:  Wt Readings from Last 1 Encounters:  02/15/17 160 lb 0.9 oz (72.6 kg)   Wt Readings from Last 10 Encounters:  02/15/17 160 lb 0.9 oz (72.6 kg)  08/05/16 181 lb (82.1 kg)  08/01/16 181 lb (82.1 kg)  06/25/16 190 lb 3.2 oz (86.3 kg)  06/17/16 190 lb 3.2 oz (86.3 kg)  05/24/16 192 lb (87.1 kg)  05/19/16 192 lb 4.8 oz (87.2 kg)  03/19/16 169 lb (76.7 kg)  03/20/15 192 lb (87.1 kg)  06/13/14 184 lb (83.5 kg)   Ideal Body Weight:  67.27 kg  BMI:  Body mass index is 25.07 kg/m.  Estimated Nutritional Needs:  Kcal:  1950-2150 (27-30 kcal/kg bw) Protein:  102-116 g (1.4-1.6g/kg bw) Fluid:  >1.8 L (25 ml/kg bw)  EDUCATION NEEDS:  No education needs identified at this time  Burtis Junes RD, LDN,  CNSC Clinical Nutrition Pager: [phone] 02/15/2017 10:51 AM

## 2017-02-15 NOTE — Evaluation (Signed)
Physical Therapy Evaluation Patient Details Name: Ryan Pacheco MRN: [id number] DOB: [birth year] Today's Date: 02/15/2017   History of Present Illness  Patient is a 83 yo male admitted 02/14/17 after hitting Rt foot on w/c, cutting Rt 2nd toe.   Patient with osteomyelitis Rt 2nd toe with cellulitis Rt foot/leg, and fractured 4th and 5th toes.  Orthopedics - possible amputation.    PMH:  HTN, DM, stage 3 CKD, and BPH (s/p suprapubic catheter), HOH, MI, bradycardia, MI, arthritis  Clinical Impression  Patient presents with problems listed below. Will benefit from acute PT to maximize functional mobility prior to discharge.  Recommend patient d/c to SNF for continued therapy.    Follow Up Recommendations SNF;Supervision/Assistance - 24 hour    Equipment Recommendations  None recommended by PT    Recommendations for Other Services       Precautions / Restrictions Precautions Precautions: Fall Precaution Comments: Falls pta Restrictions Weight Bearing Restrictions: No      Mobility  Bed Mobility Overal bed mobility: Needs Assistance Bed Mobility: Rolling;Sidelying to Sit Rolling: Min guard Sidelying to sit: Mod assist       General bed mobility comments: Patient able to move in bed on his own.  Assist for safety only.  Patient required mod assist to bring trunk to upright sitting position.  Transfers Overall transfer level: Needs assistance Equipment used: 2 person hand held assist Transfers: Sit to/from [facility] Sit to Stand: Mod assist;+2 physical assistance Stand pivot transfers: Mod assist;Max assist;+2 physical assistance       General transfer comment: Mod assist to power up to standing.  Unable to reach upright position with knees, hips, and trunk in flexion.  Patient initially required mod assist to shuffle feet for transfer.  Half way to chair, patient reports "I have to sit down".  Patient then required +2 max assist to move patient to chair  safely.  Ambulation/Gait             General Gait Details: NT  Stairs            Wheelchair Mobility    Modified Rankin (Stroke Patients Only)       Balance Overall balance assessment: Needs assistance;History of Falls Sitting-balance support: No upper extremity supported;Feet supported Sitting balance-Leahy Scale: Fair     Standing balance support: Bilateral upper extremity supported Standing balance-Leahy Scale: Poor                               Pertinent Vitals/Pain Pain Assessment: No/denies pain    Home Living Family/patient expects to be discharged to:: Skilled nursing facility                      Prior Function Level of Independence: Needs assistance   Gait / Transfers Assistance Needed: Patient reports needing assist with transfers into/out of wheelchair.  Reports he does not walk.    ADL's / Homemaking Assistance Needed: Assist with bathing, meal prep, housekeeping.  Comments: Patient providing information - needs to be verified.     Hand Dominance        Extremity/Trunk Assessment   Upper Extremity Assessment Upper Extremity Assessment: Generalized weakness    Lower Extremity Assessment Lower Extremity Assessment: Generalized weakness;RLE deficits/detail;LLE deficits/detail RLE Deficits / Details: Patient with decreased knee extension ROM.   RLE Coordination: decreased gross motor LLE Deficits / Details: Patient with decreased knee extension ROM.   LLE  Coordination: decreased gross motor    Cervical / Trunk Assessment Cervical / Trunk Assessment: Kyphotic  Communication   Communication: HOH  Cognition Arousal/Alertness: Awake/alert Behavior During Therapy: WFL for tasks assessed/performed Overall Cognitive Status: No family/caregiver present to determine baseline cognitive functioning                                        General Comments      Exercises     Assessment/Plan    PT  Assessment Patient needs continued PT services  PT Problem List Decreased strength;Decreased range of motion;Decreased activity tolerance;Decreased balance;Decreased mobility;Decreased coordination;Decreased knowledge of use of DME;Decreased safety awareness       PT Treatment Interventions DME instruction;Gait training;Functional mobility training;Therapeutic activities;Therapeutic exercise;Balance training;Patient/family education    PT Goals (Current goals can be found in the Care Plan section)  Acute Rehab PT Goals Patient Stated Goal: None stated PT Goal Formulation: With patient Time For Goal Achievement: 02/22/17 Potential to Achieve Goals: Fair    Frequency Min 2X/week   Barriers to discharge        Co-evaluation               End of Session Equipment Utilized During Treatment: Gait belt Activity Tolerance: Patient tolerated treatment well;Patient limited by fatigue Patient left: in chair;with call bell/phone within reach;with chair alarm set Nurse Communication: Mobility status PT Visit Diagnosis: Unsteadiness on feet (R26.81);History of falling (Z91.81);Muscle weakness (generalized) (M62.81);Difficulty in walking, not elsewhere classified (R26.2)    Time: 8206-0156 PT Time Calculation (min) (ACUTE ONLY): 15 min   Charges:   PT Evaluation $PT Eval Moderate Complexity: 1 Procedure     PT G Codes:        Carita Pian. Sanjuana Kava, [facility] Acute Rehab Services Pager [phone]   Despina Pole 02/15/2017, 8:07 PM

## 2017-02-15 NOTE — Progress Notes (Signed)
Pharmacy Antibiotic Note  Ryan Pacheco is a 83 y.o. male facility resident admitted on 02/14/2017 with several recent superficial wounds that have become erythematous and/or purulent.  Imaging concerning for osteomyelitis and septic arthritis.  Pharmacy has been consulted for vancomycin and Zosyn dosing.  Patient's renal function is stable.  He is afebrile and his WBC is WNL.     Plan: - Change vanc to 1500mg  IV Q24H for goal trough to 15-20 mcg/mL - Continue Zosyn 3.375gm IV Q8H, 4 hr infusion - Monitor renal fxn, clinical progress, vanc trough prior to 4th dose   Height: 5\' 7"  (170.2 cm) Weight: 160 lb 0.9 oz (72.6 kg) IBW/kg (Calculated) : 66.1  Temp (24hrs), Avg:97.5 F (36.4 C), Min:97.3 F (36.3 C), Max:97.6 F (36.4 C)   Recent Labs Lab 02/14/17 1606 02/14/17 1617 02/14/17 1904 02/15/17 0010  WBC 9.9  --   --  9.5  CREATININE 1.05  --   --  1.00  LATICACIDVEN  --  1.34 1.44  --     Estimated Creatinine Clearance: 41.3 mL/min (by C-G formula based on SCr of 1 mg/dL).    No Known Allergies   Vanc 4/20 >> Zosyn 4/20 >>   Katrenia Alkins D. Mina Marble, PharmD, BCPS Pager:  [phone] 02/15/2017, 10:57 AM

## 2017-02-15 NOTE — Progress Notes (Signed)
Pt admited from [facility] ED.

## 2017-02-15 NOTE — Consult Note (Signed)
ORTHOPAEDIC CONSULTATION  REQUESTING PHYSICIAN: Thurnell Lose, MD  Chief Complaint: right 2nd toe wound  HPI: Ryan Pacheco is a 83 y.o. male who presents with right 2nd toe wound for a few weeks.  HPI details are from the admission H&P as patient is not answering my questions appropriately.  He lacerated his toe on his wheelchair a couple of weeks ago and has gotten worse.  He endorses pain in the toe.  Daughter insisted that he go to the ER.  He denies constitutional sxs.  Ortho consulted.  Past Medical History:  Diagnosis Date  . Acid reflux   . Angina   . Arthritis 01-09-12   hands, foot  . Benign prostatic hypertrophy   . Bradycardia   . Chronic kidney disease    Stage 1  through Stage 4 of unspecified chronic kidney disease.  . Diabetes mellitus   . Edema   . Embolism - blood clot ~ 2000   "behind left knee"  . Foley catheter in place    at present to leg bag  . Hearing impaired person, bilateral    right ear hearing aid-"loss other"  . History of asbestos exposure    "have some lung disease from years of exposure"  . Impaired gait and mobility    ambulates with walker- prone to frequent falls  . Myocardial infarction [facility]) 11/17/10    NSTEMI- Dr. Irish Lack follows  . Prostate hypertrophy 01-09-12   urinary retention-Foley catheter at present; surgery planned   Past Surgical History:  Procedure Laterality Date  . BLEPHAROPLASTY     left eye  . CARDIAC CATHETERIZATION  01-09-12   1'13  . CATARACT EXTRACTION, BILATERAL  ~ 2010  . CHOLECYSTECTOMY  1980's  . CORONARY ARTERY BYPASS GRAFT  11/26/2011   Procedure:x4- CORONARY ARTERY BYPASS GRAFTING (CABG);  Surgeon: Gaye Pollack, MD;  Location: [place];  Service: Open Heart Surgery;  Laterality: N/A;  . INSERTION OF SUPRAPUBIC CATHETER N/A 08/05/2016   Procedure: CYSTOSCOPY AND INSERTION OF SUPRAPUBIC CATHETER;  Surgeon: Carolan Clines, MD;  Location: [facility];  Service: Urology;  Laterality: N/A;  . LEFT HEART  CATHETERIZATION WITH CORONARY ANGIOGRAM N/A 11/20/2011   Procedure: LEFT HEART CATHETERIZATION WITH CORONARY ANGIOGRAM;  Surgeon: Jettie Booze, MD;  Location: [facility] CATH LAB;  Service: Cardiovascular;  Laterality: N/A;  possible PCI  . Suprapubic tube     3'13, then removed.   Social History   Social History  . Marital status: Widowed    Spouse name: N/A  . Number of children: N/A  . Years of education: N/A   Social History Main Topics  . Smoking status: Former Smoker    Types: Cigars  . Smokeless tobacco: Former Systems developer    Types: Chew     Comment: "hadn't used chew or smoked cigars since 1990's  . Alcohol use No  . Drug use: No  . Sexual activity: No   Other Topics Concern  . None   Social History Narrative  . None   Family History  Problem Relation Age of Onset  . Heart attack Father   . Hypertension Neg Hx   . Stroke Neg Hx    - negative except otherwise stated in the family history section No Known Allergies Prior to Admission medications   Medication Sig Start Date End Date Taking? Authorizing Provider  aspirin EC 81 MG tablet Take 81 mg by mouth daily.   Yes Historical Provider, MD  atorvastatin (LIPITOR) 10 MG tablet  Take 5 mg by mouth daily at 8 pm.    Yes Historical Provider, MD  docusate sodium (COLACE) 100 MG capsule Take 100 mg by mouth 2 (two) times daily.   Yes Historical Provider, MD  guaiFENesin (MUCINEX) 600 MG 12 hr tablet Take 600 mg by mouth 2 (two) times daily.   Yes Historical Provider, MD  methylcellulose (ARTIFICIAL TEARS) 1 % ophthalmic solution Place 2 drops into both eyes 4 (four) times daily.   Yes Historical Provider, MD  Multiple Vitamin (MULITIVITAMIN WITH MINERALS) TABS Take 1 tablet by mouth daily.   Yes Historical Provider, MD  nystatin (NYSTATIN) powder Apply 1 g topically 2 (two) times daily.   Yes Historical Provider, MD  pantoprazole (PROTONIX) 40 MG tablet Take 40 mg by mouth daily.    Yes Historical Provider, MD  potassium  chloride SA (K-DUR,KLOR-CON) 20 MEQ tablet Take 20 mEq by mouth daily.    Yes Historical Provider, MD  Skin Protectants, Misc. (DIMETHICONE-ZINC OXIDE) cream Apply 1 application topically 2 (two) times daily. BAZA PROTECT CREAM-apply skin prep to reddened area; apply to protect from moisture.   Yes Historical Provider, MD  acetaminophen (TYLENOL) 325 MG tablet Take 650 mg by mouth every 6 (six) hours as needed for mild pain.    Historical Provider, MD  dextromethorphan (DELSYM) 30 MG/5ML liquid Take 60 mg by mouth 2 (two) times daily as needed for cough.    Historical Provider, MD   Dg Tibia/fibula Left  Result Date: 02/14/2017 CLINICAL DATA:  Left lower extremity wound. EXAM: LEFT TIBIA AND FIBULA - 2 VIEW COMPARISON:  None. FINDINGS: The knee and ankle joints are maintained. No acute bony findings or destructive bony changes. Small soft tissue wound noted involving the midshaft region on the leg anteriorly. Vascular calcifications are noted. IMPRESSION: No acute bony findings. Electronically Signed   By: Marijo Sanes M.D.   On: 02/14/2017 16:50   Dg Foot Complete Right  Result Date: 02/14/2017 CLINICAL DATA:  Open wounds involving the first and second toes. EXAM: RIGHT FOOT COMPLETE - 3+ VIEW COMPARISON:  None. FINDINGS: Destructive bony changes involving the second toe with findings consistent with septic arthritis at the PIP joint and osteomyelitis involving the adjacent proximal and middle phalanges. Degenerative changes at the first metatarsal phalangeal joint. No definite findings for osteomyelitis. Could not exclude fractures of the fourth and fifth proximal phalanges. The metatarsals are intact. IMPRESSION: Plain film findings consistent with septic arthritis involving the PIP joint of the second toe with adjacent osteomyelitis involving the proximal and middle phalanges. Possible fractures of the fourth and fifth proximal phalanges. Electronically Signed   By: Marijo Sanes M.D.   On:  02/14/2017 16:48   - pertinent xrays, CT, MRI studies were reviewed and independently interpreted  Positive ROS: All other systems have been reviewed and were otherwise negative with the exception of those mentioned in the HPI and as above.  Physical Exam: General: Alert, no acute distress Cardiovascular: No pedal edema Respiratory: No cyanosis, no use of accessory musculature GI: No organomegaly, abdomen is soft and non-tender Skin: No lesions in the area of chief complaint Neurologic: Sensation intact distally Psychiatric: Patient is not answering questions appropriately. Oriented to self. Lymphatic: No axillary or cervical lymphadenopathy  MUSCULOSKELETAL:  - dorsal 2nd toe wound with gross purulence - mild streaking cellulitis - 1+ pedal pulse  Assessment: Right 2nd toe wound  Plan: - xrays show destruction of the phalanges of 2nd toe - nondisplaced fx of 4th and  5th toes likely from wheelchair accident - continue IV abx for cellulitis and to decrease infectious burden - will likely need toe amputation, I will discuss with Dr. Sharol Given - patient is not septic  Thank you for the consult and the opportunity to see Mr. Vishnu Moeller. Eduard Roux, MD [place] 11:10 AM

## 2017-02-16 LAB — BLOOD CULTURE ID PANEL (REFLEXED)
ACINETOBACTER BAUMANNII: NOT DETECTED
CANDIDA ALBICANS: NOT DETECTED
CANDIDA KRUSEI: NOT DETECTED
CANDIDA PARAPSILOSIS: NOT DETECTED
Candida glabrata: NOT DETECTED
Candida tropicalis: NOT DETECTED
ENTEROBACTERIACEAE SPECIES: NOT DETECTED
ENTEROCOCCUS SPECIES: NOT DETECTED
ESCHERICHIA COLI: NOT DETECTED
Enterobacter cloacae complex: NOT DETECTED
Haemophilus influenzae: NOT DETECTED
KLEBSIELLA OXYTOCA: NOT DETECTED
Klebsiella pneumoniae: NOT DETECTED
LISTERIA MONOCYTOGENES: NOT DETECTED
METHICILLIN RESISTANCE: DETECTED — AB
Neisseria meningitidis: NOT DETECTED
PSEUDOMONAS AERUGINOSA: NOT DETECTED
Proteus species: NOT DETECTED
STREPTOCOCCUS AGALACTIAE: NOT DETECTED
STREPTOCOCCUS PYOGENES: NOT DETECTED
Serratia marcescens: NOT DETECTED
Staphylococcus aureus (BCID): NOT DETECTED
Staphylococcus species: DETECTED — AB
Streptococcus pneumoniae: NOT DETECTED
Streptococcus species: NOT DETECTED

## 2017-02-16 LAB — GLUCOSE, CAPILLARY
GLUCOSE-CAPILLARY: 106 mg/dL — AB (ref 65–99)
GLUCOSE-CAPILLARY: 118 mg/dL — AB (ref 65–99)
Glucose-Capillary: 80 mg/dL (ref 65–99)
Glucose-Capillary: 104 mg/dL — ABNORMAL HIGH (ref 65–99)

## 2017-02-16 LAB — HEMOGLOBIN A1C
Hgb A1c MFr Bld: 5.4 % (ref 4.8–5.6)
MEAN PLASMA GLUCOSE: 108 mg/dL

## 2017-02-16 LAB — MRSA PCR SCREENING: MRSA by PCR: NEGATIVE

## 2017-02-16 NOTE — Progress Notes (Signed)
PROGRESS NOTE                                                                                                                                                                                                             Patient Demographics:    Ryan Pacheco, is a 83 y.o. male, DOB - 04-19-1920, NAT:[id number]  Admit date - 02/14/2017   Admitting Physician No admitting provider for patient encounter.  Outpatient Primary MD for the patient is [name], MD  LOS - 2  Chief Complaint  Patient presents with  . Wound Infection       Brief Narrative   Ryan Pacheco is a 83 y/o male with PMH significant for HTN, DM, stage 3 CKD, and BPH (s/p suprapubic catheter); who presented to ED secondary to no healing wound on his right foot after experiencing a traumatic injury with his wheelchair couple weeks ago workup showed possible R 2nd toe infection.   Subjective:    Ryan Pacheco today has, No headache, No chest pain, No abdominal pain - No Nausea, No new weakness tingling or numbness, No Cough - SOB. Unreliable historian.   Assessment  & Plan :     1. Traumatic R foot injury with R 2nd Septic arthritis/ Osteomyelitis with multiple toe fractures - high CRP, ESR stable for age, on emp IV ABX, Ortho on board, will be a moderate  risk candidate for adverse Cardio-pulm outcome during periop period. No Sepsis.  2. Old L.Leg wound - W care.  3. HTN - only PNR hydralazine  4. BPH - chronic indwelling foley.  5. Dyslipidemia - on statin  6. CKD 2 - baseline creat around 1, at baseline.  7. GERD on PPI  8. DM2 - ISS  CBG (last 3)   Recent Labs  02/15/17 1811 02/15/17 2313 02/16/17 0653  GLUCAP 106* 101* 80      Diet : Diet heart healthy/carb modified Room service appropriate? Yes; Fluid consistency: Thin    Family Communication  :  Left message on 2 listed phone numbers - 02-15-17 @ 11 am  Code Status :   DNR  Disposition Plan  :  SNF  Consults  :  Ortho  Procedures  :      DVT Prophylaxis  :    Heparin    Lab Results  Component  Value Date   PLT 234 02/15/2017    Inpatient Medications  Scheduled Meds: . aspirin EC  81 mg Oral Daily  . atorvastatin  5 mg Oral Q2000  . barrier cream  1 application Topical BID  . feeding supplement (ENSURE ENLIVE)  237 mL Oral BID BM  . feeding supplement (PRO-STAT SUGAR FREE 64)  30 mL Oral BID  . heparin  5,000 Units Subcutaneous Q8H  . insulin aspart  0-5 Units Subcutaneous QHS  . insulin aspart  0-9 Units Subcutaneous TID WC  . multivitamin with minerals  1 tablet Oral Daily  . pantoprazole  40 mg Oral Daily  . polyvinyl alcohol  2 drop Both Eyes QID  . potassium chloride SA  20 mEq Oral Daily  . sodium chloride flush  3 mL Intravenous Q12H   Continuous Infusions: . sodium chloride    . piperacillin-tazobactam (ZOSYN)  IV 3.375 g (02/16/17 0629)  . vancomycin Stopped (02/15/17 2011)   PRN Meds:.sodium chloride, acetaminophen, docusate sodium, hydrALAZINE, ondansetron **OR** ondansetron (ZOFRAN) IV, oxyCODONE, sodium chloride flush  Antibiotics  :    Anti-infectives    Start     Dose/Rate Route Frequency Ordered Stop   02/15/17 1700  vancomycin (VANCOCIN) 1,500 mg in sodium chloride 0.9 % 500 mL IVPB     1,500 mg 250 mL/hr over 120 Minutes Intravenous Every 24 hours 02/15/17 1058     02/15/17 0000  piperacillin-tazobactam (ZOSYN) IVPB 3.375 g     3.375 g 12.5 mL/hr over 240 Minutes Intravenous Every 8 hours 02/14/17 1731     02/14/17 1800  vancomycin (VANCOCIN) 1,250 mg in sodium chloride 0.9 % 250 mL IVPB  Status:  Discontinued     1,250 mg 166.7 mL/hr over 90 Minutes Intravenous Every 24 hours 02/14/17 1731 02/15/17 1058   02/14/17 1800  piperacillin-tazobactam (ZOSYN) IVPB 3.375 g     3.375 g 100 mL/hr over 30 Minutes Intravenous  Once 02/14/17 1731 02/14/17 1841         Objective:   Vitals:   02/15/17 1800 02/15/17  2020 02/16/17 0400 02/16/17 0655  BP: (!) 156/57 (!) 136/58  (!) 157/62  Pulse: (!) 57 (!) 55  68  Resp: '16 16 20 16  ' Temp: 97.6 F (36.4 C) 97.4 F (36.3 C)  97.9 F (36.6 C)  TempSrc: Axillary Oral  Oral  SpO2: 99% 100%  98%  Weight:      Height:        Wt Readings from Last 3 Encounters:  02/15/17 72.6 kg (160 lb 0.9 oz)  08/05/16 82.1 kg (181 lb)  08/01/16 82.1 kg (181 lb)     Intake/Output Summary (Last 24 hours) at 02/16/17 1014 Last data filed at 02/16/17 0655  Gross per 24 hour  Intake              840 ml  Output             1300 ml  Net             -460 ml     Physical Exam  Awake but pleasantly confused,  No new F.N deficits, Normal affect [place].AT,PERRAL Supple Neck,No JVD, No cervical lymphadenopathy appriciated.  Symmetrical Chest wall movement, Good air movement bilaterally, CTAB RRR,No Gallops,Rubs or new Murmurs, No Parasternal Heave +ve B.Sounds, Abd Soft, No tenderness, No organomegaly appriciated, No rebound - guarding or rigidity. Booth feet and L ankle area under bandage    Data Review:    CBC  Recent Labs Lab 02/14/17 1606 02/15/17 0010  WBC 9.9 9.5  HGB 12.2* 12.0*  HCT 37.7* 36.8*  PLT 235 234  MCV 91.1 92.5  MCH 29.5 30.2  MCHC 32.4 32.6  RDW 14.0 13.8  LYMPHSABS 1.7  --   MONOABS 0.9  --   EOSABS 0.4  --   BASOSABS 0.0  --     Chemistries   Recent Labs Lab 02/14/17 1606 02/15/17 0010  NA 141 145  K 3.9 4.3  CL 111 109  CO2 26 24  GLUCOSE 124* 103*  BUN 19 16  CREATININE 1.05 1.00  CALCIUM 8.6* 8.9  MG  --  2.0  AST  --  18  ALT  --  19  ALKPHOS  --  64  BILITOT  --  0.7   ------------------------------------------------------------------------------------------------------------------ No results for input(s): CHOL, HDL, LDLCALC, TRIG, CHOLHDL, LDLDIRECT in the last 72 hours.  Lab Results  Component Value Date   HGBA1C 5.9 (H) 08/01/2016    ------------------------------------------------------------------------------------------------------------------  Recent Labs  02/15/17 0010  TSH 4.666*   ------------------------------------------------------------------------------------------------------------------ No results for input(s): VITAMINB12, FOLATE, FERRITIN, TIBC, IRON, RETICCTPCT in the last 72 hours.  Coagulation profile No results for input(s): INR, PROTIME in the last 168 hours.  No results for input(s): DDIMER in the last 72 hours.  Cardiac Enzymes  Recent Labs Lab 02/14/17 2059  TROPONINI <0.03   ------------------------------------------------------------------------------------------------------------------    Component Value Date/Time   BNP 363.2 (H) 05/19/2016 1037    Micro Results Recent Results (from the past 240 hour(s))  Blood culture (routine x 2)     Status: None (Preliminary result)   Collection Time: 02/14/17  5:05 PM  Result Value Ref Range Status   Specimen Description BLOOD RIGHT ANTECUBITAL  Final   Special Requests   Final    BOTTLES DRAWN AEROBIC AND ANAEROBIC Blood Culture adequate volume   Culture  Setup Time   Final    GRAM POSITIVE COCCI IN CLUSTERS CRITICAL RESULT CALLED TO, READ BACK BY AND VERIFIED WITH: TO VBRYK(PHARMd) BY TCLEVELAND 02/16/2017 AT 12:59AM IN BOTH AEROBIC AND ANAEROBIC BOTTLES    Culture   Final    GRAM POSITIVE COCCI CULTURE REINCUBATED FOR BETTER GROWTH Performed at [facility], [place] [address] [place], [place] [address]    Report Status PENDING  Incomplete  Blood Culture ID Panel (Reflexed)     Status: Abnormal   Collection Time: 02/14/17  5:05 PM  Result Value Ref Range Status   Enterococcus species NOT DETECTED NOT DETECTED Final   Listeria monocytogenes NOT DETECTED NOT DETECTED Final   Staphylococcus species DETECTED (A) NOT DETECTED Final    Comment: Methicillin (oxacillin) resistant coagulase negative staphylococcus. Possible blood  culture contaminant (unless isolated from more than one blood culture draw or clinical case suggests pathogenicity). No antibiotic treatment is indicated for blood  culture contaminants. CRITICAL RESULT CALLED TO, READ BACK BY AND VERIFIED WITH: TO VBRYK(PHARMd) BY TCLEVELAND 02/16/2017 AT 12:59AM    Staphylococcus aureus NOT DETECTED NOT DETECTED Final   Methicillin resistance DETECTED (A) NOT DETECTED Final    Comment: CRITICAL RESULT CALLED TO, READ BACK BY AND VERIFIED WITH: TO VBRYK(PHARMd) BY TCLEVELAND 02/16/2017 AT 12:59AM    Streptococcus species NOT DETECTED NOT DETECTED Final   Streptococcus agalactiae NOT DETECTED NOT DETECTED Final   Streptococcus pneumoniae NOT DETECTED NOT DETECTED Final   Streptococcus pyogenes NOT DETECTED NOT DETECTED Final   Acinetobacter baumannii NOT DETECTED NOT DETECTED Final   Enterobacteriaceae species NOT  DETECTED NOT DETECTED Final   Enterobacter cloacae complex NOT DETECTED NOT DETECTED Final   Escherichia coli NOT DETECTED NOT DETECTED Final   Klebsiella oxytoca NOT DETECTED NOT DETECTED Final   Klebsiella pneumoniae NOT DETECTED NOT DETECTED Final   Proteus species NOT DETECTED NOT DETECTED Final   Serratia marcescens NOT DETECTED NOT DETECTED Final   Haemophilus influenzae NOT DETECTED NOT DETECTED Final   Neisseria meningitidis NOT DETECTED NOT DETECTED Final   Pseudomonas aeruginosa NOT DETECTED NOT DETECTED Final   Candida albicans NOT DETECTED NOT DETECTED Final   Candida glabrata NOT DETECTED NOT DETECTED Final   Candida krusei NOT DETECTED NOT DETECTED Final   Candida parapsilosis NOT DETECTED NOT DETECTED Final   Candida tropicalis NOT DETECTED NOT DETECTED Final    Comment: Performed at [facility], [place] [address] [place], [place] [address]  Blood culture (routine x 2)     Status: None (Preliminary result)   Collection Time: 02/14/17  5:10 PM  Result Value Ref Range Status   Specimen Description LEFT ANTECUBITAL  Final    Special Requests IN PEDIATRIC BOTTLE Blood Culture adequate volume  Final   Culture   Final    NO GROWTH < 24 HOURS Performed at [facility], [place] [address] [place],  [address]    Report Status PENDING  Incomplete  MRSA PCR Screening     Status: None   Collection Time: 02/16/17  1:00 AM  Result Value Ref Range Status   MRSA by PCR NEGATIVE NEGATIVE Final    Comment:        The GeneXpert MRSA Assay (FDA approved for NASAL specimens only), is one component of a comprehensive MRSA colonization surveillance program. It is not intended to diagnose MRSA infection nor to guide or monitor treatment for MRSA infections.     Radiology Reports Dg Tibia/fibula Left  Result Date: 02/14/2017 CLINICAL DATA:  Left lower extremity wound. EXAM: LEFT TIBIA AND FIBULA - 2 VIEW COMPARISON:  None. FINDINGS: The knee and ankle joints are maintained. No acute bony findings or destructive bony changes. Small soft tissue wound noted involving the midshaft region on the leg anteriorly. Vascular calcifications are noted. IMPRESSION: No acute bony findings. Electronically Signed   By: Marijo Sanes M.D.   On: 02/14/2017 16:50   Dg Foot Complete Right  Result Date: 02/14/2017 CLINICAL DATA:  Open wounds involving the first and second toes. EXAM: RIGHT FOOT COMPLETE - 3+ VIEW COMPARISON:  None. FINDINGS: Destructive bony changes involving the second toe with findings consistent with septic arthritis at the PIP joint and osteomyelitis involving the adjacent proximal and middle phalanges. Degenerative changes at the first metatarsal phalangeal joint. No definite findings for osteomyelitis. Could not exclude fractures of the fourth and fifth proximal phalanges. The metatarsals are intact. IMPRESSION: Plain film findings consistent with septic arthritis involving the PIP joint of the second toe with adjacent osteomyelitis involving the proximal and middle phalanges. Possible fractures of the fourth and  fifth proximal phalanges. Electronically Signed   By: Marijo Sanes M.D.   On: 02/14/2017 16:48    Time Spent in minutes  30   Lala Lund M.D on 02/16/2017 at 10:14 AM  Between 7am to 7pm - Pager - [phone] ( page via [facility], text pages only, please mention full 10 digit call back number). After 7pm go to [email or web address] - password [facility]

## 2017-02-16 NOTE — Clinical Social Work Note (Signed)
Clinical Social Work Assessment  Patient Details  Name: Ryan Pacheco MRN: [id number] Date of Birth: [birth year]  Date of referral:  02/16/17               Reason for consult:  Discharge Planning                Permission sought to share information with:  Family Supports Permission granted to share information::  Yes, Verbal Permission Granted  Name::     Suzie Portela  Agency::     Relationship::  daughter  Contact Information:  [phone]  Housing/Transportation Living arrangements for the past 2 months:  [place] of Information:  Adult Children Patient Interpreter Needed:  None Criminal Activity/Legal Involvement Pertinent to Current Situation/Hospitalization:  No - Comment as needed Significant Relationships:  Adult Children Lives with:  Facility Resident Do you feel safe going back to the place where you live?  Yes Need for family participation in patient care:  No (Coment)  Care giving concerns:  [facility]   Social Worker assessment / plan:  CSW spoke to patients daughter and she stated that she is agreeable for patient to discharge to SNF. [name] would prefer [facility] but she stated family has not decided on what to do in terms of patients care. [name] stated they do not know if patient will be doing the surgery and getting his foot amputated.   Employment status:  Retired Forensic scientist:  [employer] PT Recommendations:  [place] / Referral to community resources:  [place]  Patient/Family's Response to care: Patient verbalized appreciation and understanding for CSW role and involvement in care  Patient/Family's Understanding of and Emotional Response to Diagnosis, Current Treatment, and Prognosis:  Family with good understanding of current medical state and limitations around most recent hospitalization.   Emotional Assessment Appearance:  Appears stated  age Attitude/Demeanor/Rapport:  Unable to Assess Affect (typically observed):  Unable to Assess Orientation:  Oriented to Self, Oriented to Situation, Oriented to Place, Oriented to  Time Alcohol / Substance use:  Not Applicable Psych involvement (Current and /or in the community):  No (Comment)  Discharge Needs  Concerns to be addressed:  No discharge needs identified Readmission within the last 30 days:  No Current discharge risk:  None Barriers to Discharge:  No Barriers Identified   Wende Neighbors, LCSW 02/16/2017, 2:26 PM

## 2017-02-16 NOTE — Progress Notes (Signed)
PHARMACY - PHYSICIAN COMMUNICATION CRITICAL VALUE ALERT - BLOOD CULTURE IDENTIFICATION (BCID)  Results for orders placed or performed during the hospital encounter of 02/14/17  Blood Culture ID Panel (Reflexed) (Collected: 02/14/2017  5:05 PM)  Result Value Ref Range   Enterococcus species NOT DETECTED NOT DETECTED   Listeria monocytogenes NOT DETECTED NOT DETECTED   Staphylococcus species DETECTED (A) NOT DETECTED   Staphylococcus aureus NOT DETECTED NOT DETECTED   Methicillin resistance DETECTED (A) NOT DETECTED   Streptococcus species NOT DETECTED NOT DETECTED   Streptococcus agalactiae NOT DETECTED NOT DETECTED   Streptococcus pneumoniae NOT DETECTED NOT DETECTED   Streptococcus pyogenes NOT DETECTED NOT DETECTED   Acinetobacter baumannii NOT DETECTED NOT DETECTED   Enterobacteriaceae species NOT DETECTED NOT DETECTED   Enterobacter cloacae complex NOT DETECTED NOT DETECTED   Escherichia coli NOT DETECTED NOT DETECTED   Klebsiella oxytoca NOT DETECTED NOT DETECTED   Klebsiella pneumoniae NOT DETECTED NOT DETECTED   Proteus species NOT DETECTED NOT DETECTED   Serratia marcescens NOT DETECTED NOT DETECTED   Haemophilus influenzae NOT DETECTED NOT DETECTED   Neisseria meningitidis NOT DETECTED NOT DETECTED   Pseudomonas aeruginosa NOT DETECTED NOT DETECTED   Candida albicans NOT DETECTED NOT DETECTED   Candida glabrata NOT DETECTED NOT DETECTED   Candida krusei NOT DETECTED NOT DETECTED   Candida parapsilosis NOT DETECTED NOT DETECTED   Candida tropicalis NOT DETECTED NOT DETECTED    Name of physician (or Provider) Contacted: Dr Reginia Forts  Changes to prescribed antibiotics required: On vanc appropriately, no change needed at this time, can consider narrowing to just vanc when appropriate.  Wynona Neat, PharmD, BCPS  02/16/2017  1:09 AM

## 2017-02-16 NOTE — NC FL2 (Signed)
[place] MEDICAID FL2 LEVEL OF CARE SCREENING TOOL     IDENTIFICATION  Patient Name: Ryan Pacheco Birthdate: 11-11-1919 Sex: male Admission Date (Current Location): 02/14/2017  [facility] and [place] Number:  Herbalist and Address:  [facility] [place], [place] [address], [place], [place] [address]      Provider Number: [id number]  Attending Physician Name and Address:  Thurnell Lose, MD  Relative Name and Phone Number:  Suzie Portela, [phone]    Current Level of Care: Hospital Recommended Level of Care: [place] Prior Approval Number:    Date Approved/Denied:   PASRR Number: [id number]  Discharge Plan: Home    Current Diagnoses: Patient Active Problem List   Diagnosis Date Noted  . Acute osteomyelitis of toe of right foot ([place]) 02/14/2017  . BPH (benign prostatic hyperplasia) 02/14/2017  . HTN (hypertension) 02/14/2017  . Type 2 diabetes with nephropathy ([place]) 02/14/2017  . CKD (chronic kidney disease), stage III 02/14/2017  . Acute renal failure ([place]) 05/19/2016  . Bladder outlet obstruction 05/19/2016  . Hyperkalemia 05/19/2016  . Frequent falls 05/19/2016  . Mixed hyperlipidemia 11/10/2013  . Old myocardial infarction 11/10/2013  . Edema 11/10/2013  . Postsurgical aortocoronary bypass status 02/06/2012  . Coronary atherosclerosis 02/06/2012    Orientation RESPIRATION BLADDER Height & Weight     Self, Time, Situation, Place  Normal External catheter Weight: 160 lb 0.9 oz (72.6 kg) Height:  5\' 7"  (170.2 cm)  BEHAVIORAL SYMPTOMS/MOOD NEUROLOGICAL BOWEL NUTRITION STATUS      Continent Diet (heart healthy/carb modified)  AMBULATORY STATUS COMMUNICATION OF NEEDS Skin   Extensive Assist Verbally Normal                       Personal Care Assistance Level of Assistance  Bathing, Feeding, Dressing Bathing Assistance: Limited assistance Feeding assistance: Independent Dressing Assistance: Limited  assistance     Functional Limitations Info  Sight, Hearing, Speech Sight Info: Adequate Hearing Info: Adequate Speech Info: Adequate    SPECIAL CARE FACTORS FREQUENCY  PT (By licensed PT), OT (By licensed OT)     PT Frequency: 5x week OT Frequency: 5x week            Contractures Contractures Info: Not present    Additional Factors Info  Code Status Code Status Info: DNR             Current Medications (02/16/2017):  This is the current hospital active medication list Current Facility-Administered Medications  Medication Dose Route Frequency Provider Last Rate Last Dose  . 0.9 %  sodium chloride infusion  250 mL Intravenous PRN Barton Dubois, MD      . acetaminophen (TYLENOL) tablet 650 mg  650 mg Oral Q6H PRN Barton Dubois, MD      . aspirin EC tablet 81 mg  81 mg Oral Daily Barton Dubois, MD   81 mg at 02/16/17 1004  . atorvastatin (LIPITOR) tablet 5 mg  5 mg Oral Q2000 Barton Dubois, MD   5 mg at 02/15/17 2158  . barrier cream (non-specified) 1 application  1 application Topical BID Barton Dubois, MD   1 application at 50/53/97 2200  . docusate sodium (COLACE) capsule 100 mg  100 mg Oral BID PRN Barton Dubois, MD      . feeding supplement (ENSURE ENLIVE) (ENSURE ENLIVE) liquid 237 mL  237 mL Oral BID BM Thurnell Lose, MD   237 mL at 02/16/17 1004  . feeding  supplement (PRO-STAT SUGAR FREE 64) liquid 30 mL  30 mL Oral BID Thurnell Lose, MD   30 mL at 02/16/17 1004  . heparin injection 5,000 Units  5,000 Units Subcutaneous Q8H Barton Dubois, MD   5,000 Units at 02/16/17 5400  . hydrALAZINE (APRESOLINE) injection 10 mg  10 mg Intravenous Q8H PRN Barton Dubois, MD      . insulin aspart (novoLOG) injection 0-5 Units  0-5 Units Subcutaneous QHS Barton Dubois, MD      . insulin aspart (novoLOG) injection 0-9 Units  0-9 Units Subcutaneous TID WC Barton Dubois, MD   1 Units at 02/15/17 1314  . multivitamin with minerals tablet 1 tablet  1 tablet Oral Daily Barton Dubois,  MD   1 tablet at 02/16/17 1004  . ondansetron (ZOFRAN) tablet 4 mg  4 mg Oral Q6H PRN Barton Dubois, MD       Or  . ondansetron [facility]) injection 4 mg  4 mg Intravenous Q6H PRN Barton Dubois, MD      . oxyCODONE (Oxy IR/ROXICODONE) immediate release tablet 5 mg  5 mg Oral Q6H PRN Barton Dubois, MD      . pantoprazole (PROTONIX) EC tablet 40 mg  40 mg Oral Daily Barton Dubois, MD   40 mg at 02/16/17 1004  . piperacillin-tazobactam (ZOSYN) IVPB 3.375 g  3.375 g Intravenous Q8H Polly Cobia, RPH   Stopped at 02/16/17 1019  . polyvinyl alcohol (LIQUIFILM TEARS) 1.4 % ophthalmic solution 2 drop  2 drop Both Eyes QID Barton Dubois, MD   2 drop at 02/16/17 1020  . potassium chloride SA (K-DUR,KLOR-CON) CR tablet 20 mEq  20 mEq Oral Daily Barton Dubois, MD   20 mEq at 02/16/17 1004  . sodium chloride flush (NS) 0.9 % injection 3 mL  3 mL Intravenous Q12H Barton Dubois, MD   3 mL at 02/16/17 1020  . sodium chloride flush (NS) 0.9 % injection 3 mL  3 mL Intravenous PRN Barton Dubois, MD      . vancomycin (VANCOCIN) 1,500 mg in sodium chloride 0.9 % 500 mL IVPB  1,500 mg Intravenous Q24H Tyrone Apple, [place] at 02/15/17 2011     Discharge Medications: Please see discharge summary for a list of discharge medications.  Relevant Imaging Results:  Relevant Lab Results:   Additional Information SS#[id number]  Wende Neighbors, LCSW

## 2017-02-17 DIAGNOSIS — M86171 Other acute osteomyelitis, right ankle and foot: Secondary | ICD-10-CM

## 2017-02-17 LAB — BASIC METABOLIC PANEL
Anion gap: 7 (ref 5–15)
BUN: 19 mg/dL (ref 6–20)
CALCIUM: 8.9 mg/dL (ref 8.9–10.3)
CHLORIDE: 107 mmol/L (ref 101–111)
CO2: 28 mmol/L (ref 22–32)
CREATININE: 1.04 mg/dL (ref 0.61–1.24)
GFR, EST NON AFRICAN AMERICAN: 59 mL/min — AB (ref >60)
Glucose, Bld: 100 mg/dL — ABNORMAL HIGH (ref 65–99)
Potassium: 4.6 mmol/L (ref 3.5–5.1)
SODIUM: 142 mmol/L (ref 135–145)

## 2017-02-17 LAB — CBC
HCT: 36.9 % — ABNORMAL LOW (ref 39.0–52.0)
HEMOGLOBIN: 11.9 g/dL — AB (ref 13.0–17.0)
MCH: 29.8 pg (ref 26.0–34.0)
MCHC: 32.2 g/dL (ref 30.0–36.0)
MCV: 92.3 fL (ref 78.0–100.0)
PLATELETS: 224 10*3/uL (ref 150–400)
RBC: 4 MIL/uL — ABNORMAL LOW (ref 4.22–5.81)
RDW: 13.8 % (ref 11.5–15.5)
WBC: 7.7 10*3/uL (ref 4.0–10.5)

## 2017-02-17 LAB — GLUCOSE, CAPILLARY
GLUCOSE-CAPILLARY: 76 mg/dL (ref 65–99)
Glucose-Capillary: 95 mg/dL (ref 65–99)
Glucose-Capillary: 120 mg/dL — ABNORMAL HIGH (ref 65–99)
Glucose-Capillary: 126 mg/dL — ABNORMAL HIGH (ref 65–99)

## 2017-02-17 LAB — PROTIME-INR
INR: 1.09
Prothrombin Time: 14.1 seconds (ref 11.4–15.2)

## 2017-02-17 NOTE — Progress Notes (Addendum)
PROGRESS NOTE                                                                                                                                                                                                             Patient Demographics:    Ryan Pacheco, is a 83 y.o. male, DOB - Apr 07, 1920, VOZ:[id number]  Admit date - 02/14/2017   Admitting Physician No admitting provider for patient encounter.  Outpatient Primary MD for the patient is [name], MD  LOS - 3  Chief Complaint  Patient presents with  . Wound Infection       Brief Narrative   Ryan Pacheco is a 83 y/o male with PMH significant for HTN, DM, stage 3 CKD, and BPH (s/p suprapubic catheter); who presented to ED secondary to no healing wound on his right foot after experiencing a traumatic injury with his wheelchair couple weeks ago workup showed possible R 2nd toe infection.   Subjective:    Kyrian Stage today In bed, denies any headache chest or abdominal pain, no focal weakness. Unreliable historian.   Assessment  & Plan :     1. Traumatic R foot injury with R 2nd Septic arthritis/ Osteomyelitis with multiple toe fractures - high CRP, ESR stable for age, No sepsis, on emp IV ABX, Ortho on board, will be a moderate - high risk candidate for adverse Cardio-pulm outcome during periop period. Been followed by orthopedics, I had detailed discussions with patient's daughter on 02/17/2017, she confirms DO NOT RESUSCITATE, she agrees that he is a moderate to high risk candidate for any adverse cardiopulmonary outcome during perioperative period.  2. Old L.Leg wound - W care.  3. HTN - only PNR hydralazine  4. BPH - continue chronic Foley catheter no acute issues.  5. Dyslipidemia - continue home dose statin  6. CKD 2 - baseline creat around 1, at baseline.  7. GERD - stable, continue home PPI.  8. 1/2 Coag -ve Staph is contamination in blood  culture - monitor.  9. DM2 - on sliding scale no change  CBG (last 3)   Recent Labs  02/16/17 1710 02/16/17 2133 02/17/17 0645  GLUCAP 104* 118* 95      Diet : Diet heart healthy/carb modified Room service appropriate? Yes; Fluid consistency: Thin    Family Communication  :  Discussed  with daughter in detail on 02/17/2017, consents for any foot surgery or ray amputation, understands that he is a high-risk candidate for adverse cardio pulmonary outcome, confirms DO NOT RESUSCITATE  Code Status :  DNR  Disposition Plan  :  SNF  Consults  :  Ortho  Procedures  :    DVT Prophylaxis  :    Heparin    Lab Results  Component Value Date   PLT 224 02/17/2017    Inpatient Medications  Scheduled Meds: . aspirin EC  81 mg Oral Daily  . atorvastatin  5 mg Oral Q2000  . barrier cream  1 application Topical BID  . feeding supplement (ENSURE ENLIVE)  237 mL Oral BID BM  . feeding supplement (PRO-STAT SUGAR FREE 64)  30 mL Oral BID  . heparin  5,000 Units Subcutaneous Q8H  . insulin aspart  0-5 Units Subcutaneous QHS  . insulin aspart  0-9 Units Subcutaneous TID WC  . multivitamin with minerals  1 tablet Oral Daily  . pantoprazole  40 mg Oral Daily  . polyvinyl alcohol  2 drop Both Eyes QID  . potassium chloride SA  20 mEq Oral Daily  . sodium chloride flush  3 mL Intravenous Q12H   Continuous Infusions: . sodium chloride    . piperacillin-tazobactam (ZOSYN)  IV 3.375 g (02/17/17 0516)  . vancomycin Stopped (02/16/17 1922)   PRN Meds:.sodium chloride, acetaminophen, docusate sodium, hydrALAZINE, ondansetron **OR** ondansetron (ZOFRAN) IV, oxyCODONE, sodium chloride flush  Antibiotics  :    Anti-infectives    Start     Dose/Rate Route Frequency Ordered Stop   02/15/17 1700  vancomycin (VANCOCIN) 1,500 mg in sodium chloride 0.9 % 500 mL IVPB     1,500 mg 250 mL/hr over 120 Minutes Intravenous Every 24 hours 02/15/17 1058     02/15/17 0000  piperacillin-tazobactam (ZOSYN)  IVPB 3.375 g     3.375 g 12.5 mL/hr over 240 Minutes Intravenous Every 8 hours 02/14/17 1731     02/14/17 1800  vancomycin (VANCOCIN) 1,250 mg in sodium chloride 0.9 % 250 mL IVPB  Status:  Discontinued     1,250 mg 166.7 mL/hr over 90 Minutes Intravenous Every 24 hours 02/14/17 1731 02/15/17 1058   02/14/17 1800  piperacillin-tazobactam (ZOSYN) IVPB 3.375 g     3.375 g 100 mL/hr over 30 Minutes Intravenous  Once 02/14/17 1731 02/14/17 1841         Objective:   Vitals:   02/16/17 0655 02/16/17 1356 02/16/17 1952 02/17/17 0530  BP: (!) 157/62 (!) 145/60 (!) 149/53 (!) 120/55  Pulse: 68 (!) 58 (!) 55 (!) 58  Resp: _0 Temp: 97.9 F (36.6 C) 97.9 F (36.6 C) 98 F (36.7 C) 97.5 F (36.4 C)  TempSrc: Oral Oral Oral Oral  SpO2: 98% 99% 99% 98%  Weight:      Height:        Wt Readings from Last 3 Encounters:  02/15/17 72.6 kg (160 lb 0.9 oz)  08/05/16 82.1 kg (181 lb)  08/01/16 82.1 kg (181 lb)     Intake/Output Summary (Last 24 hours) at 02/17/17 0921 Last data filed at 02/17/17 0600  Gross per 24 hour  Intake             1510 ml  Output             2175 ml  Net             -665 ml  Physical Exam  In no discomfort and pleasantly confused, No new F.N deficits  [place].AT,PERRAL Supple Neck,No JVD, No cervical lymphadenopathy appriciated.  Symmetrical Chest wall movement, Good air movement bilaterally, CTAB RRR,No Gallops,Rubs or new Murmurs, No Parasternal Heave +ve B.Sounds, Abd Soft, No tenderness, No organomegaly appriciated, No rebound - guarding or rigidity. Indwelling Foley catheter chronic Booth feet and L ankle area under bandage    Data Review:    CBC  Recent Labs Lab 02/14/17 1606 02/15/17 0010 02/17/17 0303  WBC 9.9 9.5 7.7  HGB 12.2* 12.0* 11.9*  HCT 37.7* 36.8* 36.9*  PLT 235 234 224  MCV 91.1 92.5 92.3  MCH 29.5 30.2 29.8  MCHC 32.4 32.6 32.2  RDW 14.0 13.8 13.8  LYMPHSABS 1.7  --   --   MONOABS 0.9  --   --   EOSABS 0.4  --    --   BASOSABS 0.0  --   --     Chemistries   Recent Labs Lab 02/14/17 1606 02/15/17 0010 02/17/17 0303  NA 141 145 142  K 3.9 4.3 4.6  CL 111 109 107  CO2 _0 GLUCOSE 124* 103* 100*  BUN _1 CREATININE 1.05 1.00 1.04  CALCIUM 8.6* 8.9 8.9  MG  --  2.0  --   AST  --  18  --   ALT  --  19  --   ALKPHOS  --  64  --   BILITOT  --  0.7  --    ------------------------------------------------------------------------------------------------------------------ No results for input(s): CHOL, HDL, LDLCALC, TRIG, CHOLHDL, LDLDIRECT in the last 72 hours.  Lab Results  Component Value Date   HGBA1C 5.4 02/15/2017   ------------------------------------------------------------------------------------------------------------------  Recent Labs  02/15/17 0010  TSH 4.666*   ------------------------------------------------------------------------------------------------------------------ No results for input(s): VITAMINB12, FOLATE, FERRITIN, TIBC, IRON, RETICCTPCT in the last 72 hours.  Coagulation profile  Recent Labs Lab 02/17/17 0303  INR 1.09    No results for input(s): DDIMER in the last 72 hours.  Cardiac Enzymes  Recent Labs Lab 02/14/17 2059  TROPONINI <0.03   ------------------------------------------------------------------------------------------------------------------    Component Value Date/Time   BNP 363.2 (H) 05/19/2016 1037    Micro Results Recent Results (from the past 240 hour(s))  Blood culture (routine x 2)     Status: None (Preliminary result)   Collection Time: 02/14/17  5:05 PM  Result Value Ref Range Status   Specimen Description BLOOD RIGHT ANTECUBITAL  Final   Special Requests   Final    BOTTLES DRAWN AEROBIC AND ANAEROBIC Blood Culture adequate volume   Culture  Setup Time   Final    GRAM POSITIVE COCCI IN CLUSTERS CRITICAL RESULT CALLED TO, READ BACK BY AND VERIFIED WITH: TO VBRYK(PHARMd) BY TCLEVELAND 02/16/2017 AT  12:59AM IN BOTH AEROBIC AND ANAEROBIC BOTTLES    Culture   Final    GRAM POSITIVE COCCI CULTURE REINCUBATED FOR BETTER GROWTH Performed at [facility], [place] [address] [place], [place] [address]    Report Status PENDING  Incomplete  Blood Culture ID Panel (Reflexed)     Status: Abnormal   Collection Time: 02/14/17  5:05 PM  Result Value Ref Range Status   Enterococcus species NOT DETECTED NOT DETECTED Final   Listeria monocytogenes NOT DETECTED NOT DETECTED Final   Staphylococcus species DETECTED (A) NOT DETECTED Final    Comment: Methicillin (oxacillin) resistant coagulase negative staphylococcus. Possible blood culture contaminant (unless isolated from more than one blood culture draw  or clinical case suggests pathogenicity). No antibiotic treatment is indicated for blood  culture contaminants. CRITICAL RESULT CALLED TO, READ BACK BY AND VERIFIED WITH: TO VBRYK(PHARMd) BY TCLEVELAND 02/16/2017 AT 12:59AM    Staphylococcus aureus NOT DETECTED NOT DETECTED Final   Methicillin resistance DETECTED (A) NOT DETECTED Final    Comment: CRITICAL RESULT CALLED TO, READ BACK BY AND VERIFIED WITH: TO VBRYK(PHARMd) BY TCLEVELAND 02/16/2017 AT 12:59AM    Streptococcus species NOT DETECTED NOT DETECTED Final   Streptococcus agalactiae NOT DETECTED NOT DETECTED Final   Streptococcus pneumoniae NOT DETECTED NOT DETECTED Final   Streptococcus pyogenes NOT DETECTED NOT DETECTED Final   Acinetobacter baumannii NOT DETECTED NOT DETECTED Final   Enterobacteriaceae species NOT DETECTED NOT DETECTED Final   Enterobacter cloacae complex NOT DETECTED NOT DETECTED Final   Escherichia coli NOT DETECTED NOT DETECTED Final   Klebsiella oxytoca NOT DETECTED NOT DETECTED Final   Klebsiella pneumoniae NOT DETECTED NOT DETECTED Final   Proteus species NOT DETECTED NOT DETECTED Final   Serratia marcescens NOT DETECTED NOT DETECTED Final   Haemophilus influenzae NOT DETECTED NOT DETECTED Final   Neisseria  meningitidis NOT DETECTED NOT DETECTED Final   Pseudomonas aeruginosa NOT DETECTED NOT DETECTED Final   Candida albicans NOT DETECTED NOT DETECTED Final   Candida glabrata NOT DETECTED NOT DETECTED Final   Candida krusei NOT DETECTED NOT DETECTED Final   Candida parapsilosis NOT DETECTED NOT DETECTED Final   Candida tropicalis NOT DETECTED NOT DETECTED Final    Comment: Performed at [facility], [place]. [address], [place], [place] [address]  Blood culture (routine x 2)     Status: None (Preliminary result)   Collection Time: 02/14/17  5:10 PM  Result Value Ref Range Status   Specimen Description LEFT ANTECUBITAL  Final   Special Requests IN PEDIATRIC BOTTLE Blood Culture adequate volume  Final   Culture   Final    NO GROWTH 2 DAYS Performed at [facility], [place] [address], [place], [place] [address]    Report Status PENDING  Incomplete  MRSA PCR Screening     Status: None   Collection Time: 02/16/17  1:00 AM  Result Value Ref Range Status   MRSA by PCR NEGATIVE NEGATIVE Final    Comment:        The GeneXpert MRSA Assay (FDA approved for NASAL specimens only), is one component of a comprehensive MRSA colonization surveillance program. It is not intended to diagnose MRSA infection nor to guide or monitor treatment for MRSA infections.     Radiology Reports Dg Tibia/fibula Left  Result Date: 02/14/2017 CLINICAL DATA:  Left lower extremity wound. EXAM: LEFT TIBIA AND FIBULA - 2 VIEW COMPARISON:  None. FINDINGS: The knee and ankle joints are maintained. No acute bony findings or destructive bony changes. Small soft tissue wound noted involving the midshaft region on the leg anteriorly. Vascular calcifications are noted. IMPRESSION: No acute bony findings. Electronically Signed   By: Marijo Sanes M.D.   On: 02/14/2017 16:50   Dg Foot Complete Right  Result Date: 02/14/2017 CLINICAL DATA:  Open wounds involving the first and second toes. EXAM: RIGHT FOOT COMPLETE - 3+  VIEW COMPARISON:  None. FINDINGS: Destructive bony changes involving the second toe with findings consistent with septic arthritis at the PIP joint and osteomyelitis involving the adjacent proximal and middle phalanges. Degenerative changes at the first metatarsal phalangeal joint. No definite findings for osteomyelitis. Could not exclude fractures of the fourth and fifth proximal  phalanges. The metatarsals are intact. IMPRESSION: Plain film findings consistent with septic arthritis involving the PIP joint of the second toe with adjacent osteomyelitis involving the proximal and middle phalanges. Possible fractures of the fourth and fifth proximal phalanges. Electronically Signed   By: Marijo Sanes M.D.   On: 02/14/2017 16:48    Time Spent in minutes  30   Lala Lund M.D on 02/17/2017 at 9:21 AM  Between 7am to 7pm - Pager - [phone] ( page via [facility], text pages only, please mention full 10 digit call back number). After 7pm go to [email or web address] - password [facility]

## 2017-02-17 NOTE — Progress Notes (Signed)
This morning around 1000, the Wound Care Nurse noted in to assess pt's wounds.  New order was written for Silver ag to be placed.  RN in at pt's bedside and applied new dressing to right toes at about 1300.  Pt. Tolerated procedure well.

## 2017-02-17 NOTE — Consult Note (Signed)
ORTHOPAEDIC CONSULTATION  REQUESTING PHYSICIAN: Thurnell Lose, MD  Chief Complaint: Ulceration and exposed bone right foot second toe  HPI: Ryan Pacheco is a 83 y.o. male who presents with diabetes and peripheral vascular disease with osteomyelitis ulceration right foot second toe  Past Medical History:  Diagnosis Date  . Acid reflux   . Angina   . Arthritis 01-09-12   hands, foot  . Benign prostatic hypertrophy   . Bradycardia   . Chronic kidney disease    Stage 1  through Stage 4 of unspecified chronic kidney disease.  . Diabetes mellitus   . Edema   . Embolism - blood clot ~ 2000   "behind left knee"  . Foley catheter in place    at present to leg bag  . Hearing impaired person, bilateral    right ear hearing aid-"loss other"  . History of asbestos exposure    "have some lung disease from years of exposure"  . Impaired gait and mobility    ambulates with walker- prone to frequent falls  . Myocardial infarction [facility]) 11/17/10    NSTEMI- Dr. Irish Lack follows  . Prostate hypertrophy 01-09-12   urinary retention-Foley catheter at present; surgery planned   Past Surgical History:  Procedure Laterality Date  . BLEPHAROPLASTY     left eye  . CARDIAC CATHETERIZATION  01-09-12   1'13  . CATARACT EXTRACTION, BILATERAL  ~ 2010  . CHOLECYSTECTOMY  1980's  . CORONARY ARTERY BYPASS GRAFT  11/26/2011   Procedure:x4- CORONARY ARTERY BYPASS GRAFTING (CABG);  Surgeon: Gaye Pollack, MD;  Location: [facility];  Service: Open Heart Surgery;  Laterality: N/A;  . INSERTION OF SUPRAPUBIC CATHETER N/A 08/05/2016   Procedure: CYSTOSCOPY AND INSERTION OF SUPRAPUBIC CATHETER;  Surgeon: Carolan Clines, MD;  Location: [facility];  Service: Urology;  Laterality: N/A;  . LEFT HEART CATHETERIZATION WITH CORONARY ANGIOGRAM N/A 11/20/2011   Procedure: LEFT HEART CATHETERIZATION WITH CORONARY ANGIOGRAM;  Surgeon: Jettie Booze, MD;  Location[facility] C[facility];  Service: Cardiovascular;  Laterality:  N/A;  possible PCI  . Suprapubic tube     3'13, then removed.   Social History   Social History  . Marital status: Widowed    Spouse name: N/A  . Number of children: N/A  . Years of education: N/A   Social History Main Topics  . Smoking status: Former Smoker    Types: Cigars  . Smokeless tobacco: Former Systems developer    Types: Chew     Comment: "hadn't used chew or smoked cigars since 1990's  . Alcohol use No  . Drug use: No  . Sexual activity: No   Other Topics Concern  . None   Social History Narrative  . None   Family History  Problem Relation Age of Onset  . Heart attack Father   . Hypertension Neg Hx   . Stroke Neg Hx    - negative except otherwise stated in the family history section No Known Allergies Prior to Admission medications   Medication Sig Start Date End Date Taking? Authorizing Provider  aspirin EC 81 MG tablet Take 81 mg by mouth daily.   Yes Historical Provider, MD  atorvastatin (LIPITOR) 10 MG tablet Take 5 mg by mouth daily at 8 pm.    Yes Historical Provider, MD  docusate sodium (COLACE) 100 MG capsule Take 100 mg by mouth 2 (two) times daily.   Yes Historical Provider, MD  guaiFENesin (MUCINEX) 600 MG 12 hr tablet Take 600  mg by mouth 2 (two) times daily.   Yes Historical Provider, MD  methylcellulose (ARTIFICIAL TEARS) 1 % ophthalmic solution Place 2 drops into both eyes 4 (four) times daily.   Yes Historical Provider, MD  Multiple Vitamin (MULITIVITAMIN WITH MINERALS) TABS Take 1 tablet by mouth daily.   Yes Historical Provider, MD  nystatin (NYSTATIN) powder Apply 1 g topically 2 (two) times daily.   Yes Historical Provider, MD  pantoprazole (PROTONIX) 40 MG tablet Take 40 mg by mouth daily.    Yes Historical Provider, MD  potassium chloride SA (K-DUR,KLOR-CON) 20 MEQ tablet Take 20 mEq by mouth daily.    Yes Historical Provider, MD  Skin Protectants, Misc. (DIMETHICONE-ZINC OXIDE) cream Apply 1 application topically 2 (two) times daily. BAZA PROTECT  CREAM-apply skin prep to reddened area; apply to protect from moisture.   Yes Historical Provider, MD  acetaminophen (TYLENOL) 325 MG tablet Take 650 mg by mouth every 6 (six) hours as needed for mild pain.    Historical Provider, MD  dextromethorphan (DELSYM) 30 MG/5ML liquid Take 60 mg by mouth 2 (two) times daily as needed for cough.    Historical Provider, MD   No results found. - pertinent xrays, CT, MRI studies were reviewed and independently interpreted  Positive ROS: All other systems have been reviewed and were otherwise negative with the exception of those mentioned in the HPI and as above.  Physical Exam: General: Alert, no acute distress,Hard of hearing Psychiatric: Patient is competent for consent with normal mood and affect Lymphatic: No axillary or cervical lymphadenopathy Cardiovascular: No pedal edema Respiratory: No cyanosis, no use of accessory musculature GI: No organomegaly, abdomen is soft and non-tender  Skin: Examination patient has an ulcer dorsally over the second toe PIP joint. There is exposed bone there is swelling.   Neurologic: Patient does not have protective sensation bilateral lower extremities.   MUSCULOSKELETAL:  Patient has a good dorsalis pedis pulse. The skin is thin and atrophic in his right foot. He has ulceration with osteomyelitis of the right foot second toe. There is no ascending cellulitis. No purulent drainage.  Assessment: Assessment: Diabetic insensate neuropathy peripheral vascular disease with ulceration osteomyelitis right foot second toe  Plan: Plan: We will plan for right foot second Ray amputation. Anticipate surgery on Wednesday. Patient may require medical power of attorney for signature of consent.  Thank you for the consult and the opportunity to see [name], [place] [phone] 6:49 AM

## 2017-02-17 NOTE — Consult Note (Signed)
[facility] Nurse wound consult note Reason for Consult: LLE wound Patient has right toe wounds that Dr. Sharol Given will address with surgical intervention Patient's family member reports that he has had trauma to the LLE pretibial area while in his power chair. These are cared for by home care nurses  Wound type: trauma LLE  Pressure Injury POA: No Measurement: LLE pretibial distal: 1.0cm x 0.5cm x 0.2cm  LLE pretibial proximal: 2cm x 2.5cm x 0.2cm  Wound bed: Distal LLE: 100% red, clean Proximal LLE: 95% red, 5% yellow fibrin Drainage (amount, consistency, odor) minimal, non purulent Periwound:intact  Dressing procedure/placement/frequency: Add silver hydrofiber for drainage and bioburdan. Change every other day. Cover with foam.  Discussed POC with patient and bedside nurse.  Re consult if needed, will not follow at this time. Thanks  Rebbecca Osuna [employer], RN,CWOCN, CNS [phone])

## 2017-02-18 LAB — GLUCOSE, CAPILLARY
GLUCOSE-CAPILLARY: 83 mg/dL (ref 65–99)
GLUCOSE-CAPILLARY: 85 mg/dL (ref 65–99)
GLUCOSE-CAPILLARY: 91 mg/dL (ref 65–99)
Glucose-Capillary: 91 mg/dL (ref 65–99)

## 2017-02-18 LAB — CULTURE, BLOOD (ROUTINE X 2): SPECIAL REQUESTS: ADEQUATE

## 2017-02-18 LAB — VANCOMYCIN, TROUGH: VANCOMYCIN TR: 16 ug/mL (ref 15–20)

## 2017-02-18 NOTE — Progress Notes (Signed)
Pharmacy Antibiotic Note  Ryan Pacheco is a 83 y.o. male facility resident admitted on 02/14/2017 with several recent superficial wounds that have become erythematous and/or purulent.  Imaging concerning for osteomyelitis and septic arthritis. On day #5 of vancomycin and zosyn. Ortho planning R 2nd toe amputation tomorrow. WBC 7.7, SCr stable at 1.  Goal trough to 15-20 mcg/mL. VT today was drawn appropriately and is therapeutic at 16.  Plan: Continue vancomycin at 1500 mg IV q24h Continue Zosyn 3.375gm IV Q8H Monitor clinical picture, renal function, VT prn F/U C&S, abx deescalation / LOT    Height: 5\' 7"  (170.2 cm) Weight: 160 lb 0.9 oz (72.6 kg) IBW/kg (Calculated) : 66.1  Temp (24hrs), Avg:97.9 F (36.6 C), Min:97.9 F (36.6 C), Max:97.9 F (36.6 C)   Recent Labs Lab 02/14/17 1606 02/14/17 1617 02/14/17 1904 02/15/17 0010 02/17/17 0303 02/18/17 1618  WBC 9.9  --   --  9.5 7.7  --   CREATININE 1.05  --   --  1.00 1.04  --   LATICACIDVEN  --  1.34 1.44  --   --   --   VANCOTROUGH  --   --   --   --   --  16    Estimated Creatinine Clearance: 39.7 mL/min (by C-G formula based on SCr of 1.04 mg/dL).    Allergies  Allergen Reactions  . No Known Allergies     Vanc 4/20 >> Zosyn 4/20 >>  4/20 blood cx: 1/2 CoNS 4/22 mrsa pcr: neg  Elenor Quinones, PharmD, [facility] Clinical Pharmacist Pager [phone] 02/18/2017 5:41 PM

## 2017-02-18 NOTE — Care Management Important Message (Signed)
Important Message  Patient Details  Name: Ryan Pacheco MRN: [id number] Date of Birth: [birth year]   Medicare Important Message Given:  Yes    Kaitlynne Wenz [address] 02/18/2017, 3:09 PM

## 2017-02-18 NOTE — Progress Notes (Signed)
Physical Therapy Treatment Patient Details Name: Ryan Pacheco MRN: [id number] DOB: 1920/02/15 Today's Date: 02/18/2017    History of Present Illness Patient is a 83 yo male admitted 02/14/17 after hitting Rt foot on w/c, cutting Rt 2nd toe.   Patient with osteomyelitis Rt 2nd toe with cellulitis Rt foot/leg, and fractured 4th and 5th toes.  Orthopedics - possible amputation.    PMH:  HTN, DM, stage 3 CKD, and BPH (s/p suprapubic catheter), HOH, MI, bradycardia, MI, arthritis    PT Comments    Pt performed increased mobility.  Improved ease of transfer with use of sara stedy lift equipment.  Pt remains appropriate for SNF at this time.  Pt is scheduled for R foot 2nd ray amputation tomorrow.  Will inform supervising PT of possible need for re-eval if weight bearing status is limited.    Follow Up Recommendations  SNF;Supervision/Assistance - 24 hour     Equipment Recommendations  None recommended by PT    Recommendations for Other Services       Precautions / Restrictions Precautions Precautions: Fall Precaution Comments: Falls pta Restrictions Weight Bearing Restrictions: No    Mobility  Bed Mobility Overal bed mobility: Needs Assistance Bed Mobility: Rolling;Supine to Sit     Supine to sit: Mod assist     General bed mobility comments: Pt required assist for trunk elevation but able to advance LEs to edge of the bed.    Transfers Overall transfer level: Needs assistance Equipment used:  (sara stedy.  ) Transfers: Sit to/from Stand Sit to Stand: Mod assist (in sara stedy frame.  )         General transfer comment: Pt performed sit to stand x3 in sara stedy frame.  pt able to follow commands for hand placement to pull into standing with decreased assistance.  Pt intially stood NWB on R but as he stood he placed R foot down for support.  Upon standing patient with bowel incontinence and required assistance for perianal care.  Pt able to stand during clean up with one  seated rest break due to fatigue.    Ambulation/Gait             General Gait Details: NT   Stairs            Wheelchair Mobility    Modified Rankin (Stroke Patients Only)       Balance Overall balance assessment: Needs assistance;History of Falls Sitting-balance support: No upper extremity supported;Feet supported Sitting balance-Leahy Scale: Fair       Standing balance-Leahy Scale: Poor                              Cognition Arousal/Alertness: Awake/alert Behavior During Therapy: WFL for tasks assessed/performed Overall Cognitive Status: No family/caregiver present to determine baseline cognitive functioning                                        Exercises      General Comments        Pertinent Vitals/Pain Pain Assessment: Faces Faces Pain Scale: Hurts little more Pain Location: R foot Pain Descriptors / Indicators: Guarding Pain Intervention(s): Monitored during session;Repositioned    Home Living                      Prior Function  PT Goals (current goals can now be found in the care plan section) Acute Rehab PT Goals Patient Stated Goal: None stated Potential to Achieve Goals: Fair Progress towards PT goals: Progressing toward goals    Frequency    Min 2X/week      PT Plan Current plan remains appropriate    Co-evaluation             End of Session   Activity Tolerance: Patient tolerated treatment well;Patient limited by fatigue Patient left: in chair;with call bell/phone within reach;with chair alarm set Nurse Communication: Mobility status PT Visit Diagnosis: Unsteadiness on feet (R26.81);History of falling (Z91.81);Muscle weakness (generalized) (M62.81);Difficulty in walking, not elsewhere classified (R26.2)     Time: 3335-4562 PT Time Calculation (min) (ACUTE ONLY): 34 min  Charges:  $Therapeutic Activity: 23-37 mins                    G Codes:       Governor Rooks, PTA pager [phone]    Cristela Blue 02/18/2017, 5:03 PM

## 2017-02-18 NOTE — Progress Notes (Signed)
PROGRESS NOTE                                                                                                                                                                                                             Patient Demographics:    Ryan Pacheco, is a 84 y.o. male, DOB - 06/22/1920, [id number]  Admit date - 02/14/2017   Admitting Physician Newt Minion, MD  Outpatient Primary MD for the patient is Ryan Austria, MD  LOS - 4  Chief Complaint  Patient presents with  . Wound Infection       Brief Narrative   [name] is a 83 y/o male with PMH significant for HTN, DM, stage 3 CKD, and BPH (s/p suprapubic catheter); who presented to ED secondary to no healing wound on his right foot after experiencing a traumatic injury with his wheelchair couple weeks ago workup showed possible R 2nd toe infection.Seen by orthopedics due for surgical intervention on 02/19/2017.   Subjective:    Ryan Pacheco today In bed, denies any headache chest or abdominal pain, no focal weakness. Unreliable historian.   Assessment  & Plan :     1. Traumatic R foot injury with R 2nd Septic arthritis/ Osteomyelitis with multiple toe fractures - high CRP, ESR stable for age, No sepsis, on emp IV ABX, Ortho on board, will be a moderate - high risk candidate for adverse Cardio-pulm outcome during periop period. Been followed by orthopedics, I had detailed discussions with patient's daughter on 02/17/2017, she confirms DO NOT RESUSCITATE, she agrees that he is a moderate to high risk candidate for any adverse cardiopulmonary outcome during perioperative period.  2. Old L.Leg wound - W care.  3. HTN - only PNR hydralazine  4. BPH - continue chronic Foley catheter no acute issues.  5. Dyslipidemia - continue home dose statin  6. CKD 2 - baseline creat around 1, at baseline.  7. GERD - stable, continue home PPI.  8. 1/2 Coag  -ve Staph is contamination in blood culture - monitor.  9. DM2 - on sliding scale no change  CBG (last 3)   Recent Labs  02/17/17 1649 02/17/17 2240 02/18/17 0721  GLUCAP 120* 126* 83      Diet : Diet heart healthy/carb modified Room service appropriate? Yes; Fluid consistency: Thin  Family Communication  :  Discussed with daughter in detail on 02/17/2017, consents for any foot surgery or ray amputation, understands that he is a high-risk candidate for adverse cardio pulmonary outcome, confirms DO NOT RESUSCITATE  Code Status :  DNR  Disposition Plan  :  SNF  Consults  :  Ortho  Procedures  :    DVT Prophylaxis  :    Heparin    Lab Results  Component Value Date   PLT 224 02/17/2017    Inpatient Medications  Scheduled Meds: . aspirin EC  81 mg Oral Daily  . atorvastatin  5 mg Oral Q2000  . barrier cream  1 application Topical BID  . feeding supplement (ENSURE ENLIVE)  237 mL Oral BID BM  . feeding supplement (PRO-STAT SUGAR FREE 64)  30 mL Oral BID  . heparin  5,000 Units Subcutaneous Q8H  . insulin aspart  0-5 Units Subcutaneous QHS  . insulin aspart  0-9 Units Subcutaneous TID WC  . multivitamin with minerals  1 tablet Oral Daily  . pantoprazole  40 mg Oral Daily  . polyvinyl alcohol  2 drop Both Eyes QID  . potassium chloride SA  20 mEq Oral Daily  . sodium chloride flush  3 mL Intravenous Q12H   Continuous Infusions: . sodium chloride    . piperacillin-tazobactam (ZOSYN)  IV 3.375 g (02/18/17 0527)  . vancomycin 1,500 mg (02/17/17 1525)   PRN Meds:.sodium chloride, acetaminophen, docusate sodium, hydrALAZINE, ondansetron **OR** ondansetron (ZOFRAN) IV, oxyCODONE, sodium chloride flush  Antibiotics  :    Anti-infectives    Start     Dose/Rate Route Frequency Ordered Stop   02/15/17 1700  vancomycin (VANCOCIN) 1,500 mg in sodium chloride 0.9 % 500 mL IVPB     1,500 mg 250 mL/hr over 120 Minutes Intravenous Every 24 hours 02/15/17 1058     02/15/17  0000  piperacillin-tazobactam (ZOSYN) IVPB 3.375 g     3.375 g 12.5 mL/hr over 240 Minutes Intravenous Every 8 hours 02/14/17 1731     02/14/17 1800  vancomycin (VANCOCIN) 1,250 mg in sodium chloride 0.9 % 250 mL IVPB  Status:  Discontinued     1,250 mg 166.7 mL/hr over 90 Minutes Intravenous Every 24 hours 02/14/17 1731 02/15/17 1058   02/14/17 1800  piperacillin-tazobactam (ZOSYN) IVPB 3.375 g     3.375 g 100 mL/hr over 30 Minutes Intravenous  Once 02/14/17 1731 02/14/17 1841         Objective:   Vitals:   02/17/17 0530 02/17/17 1500 02/17/17 2242 02/18/17 0423  BP: (!) 120/55 (!) 109/56 (!) 137/58 (!) 141/51  Pulse: (!) 58  (!) 57 (!) 55  Resp: '18 18 16 16  ' Temp: 97.5 F (36.4 C) 99.8 F (37.7 C) 97.9 F (36.6 C) 97.9 F (36.6 C)  TempSrc: Oral Oral Oral Oral  SpO2: 98%  97% 96%  Weight:      Height:        Wt Readings from Last 3 Encounters:  02/15/17 72.6 kg (160 lb 0.9 oz)  08/05/16 82.1 kg (181 lb)  08/01/16 82.1 kg (181 lb)     Intake/Output Summary (Last 24 hours) at 02/18/17 1043 Last data filed at 02/18/17 0949  Gross per 24 hour  Intake              480 ml  Output             1750 ml  Net            -  1270 ml     Physical Exam  In no discomfort and pleasantly confused, No new F.N deficits  [place].AT,PERRAL Supple Neck,No JVD, No cervical lymphadenopathy appriciated.  Symmetrical Chest wall movement, Good air movement bilaterally, CTAB RRR,No Gallops,Rubs or new Murmurs, No Parasternal Heave +ve B.Sounds, Abd Soft, No tenderness, No organomegaly appriciated, No rebound - guarding or rigidity. Booth feet and L ankle area under bandage    Data Review:    CBC  Recent Labs Lab 02/14/17 1606 02/15/17 0010 02/17/17 0303  WBC 9.9 9.5 7.7  HGB 12.2* 12.0* 11.9*  HCT 37.7* 36.8* 36.9*  PLT 235 234 224  MCV 91.1 92.5 92.3  MCH 29.5 30.2 29.8  MCHC 32.4 32.6 32.2  RDW 14.0 13.8 13.8  LYMPHSABS 1.7  --   --   MONOABS 0.9  --   --   EOSABS 0.4   --   --   BASOSABS 0.0  --   --     Chemistries   Recent Labs Lab 02/14/17 1606 02/15/17 0010 02/17/17 0303  NA 141 145 142  K 3.9 4.3 4.6  CL 111 109 107  CO2 '26 24 28  ' GLUCOSE 124* 103* 100*  BUN '19 16 19  ' CREATININE 1.05 1.00 1.04  CALCIUM 8.6* 8.9 8.9  MG  --  2.0  --   AST  --  18  --   ALT  --  19  --   ALKPHOS  --  64  --   BILITOT  --  0.7  --    ------------------------------------------------------------------------------------------------------------------ No results for input(s): CHOL, HDL, LDLCALC, TRIG, CHOLHDL, LDLDIRECT in the last 72 hours.  Lab Results  Component Value Date   HGBA1C 5.4 02/15/2017   ------------------------------------------------------------------------------------------------------------------ No results for input(s): TSH, T4TOTAL, T3FREE, THYROIDAB in the last 72 hours.  Invalid input(s): FREET3 ------------------------------------------------------------------------------------------------------------------ No results for input(s): VITAMINB12, FOLATE, FERRITIN, TIBC, IRON, RETICCTPCT in the last 72 hours.  Coagulation profile  Recent Labs Lab 02/17/17 0303  INR 1.09    No results for input(s): DDIMER in the last 72 hours.  Cardiac Enzymes  Recent Labs Lab 02/14/17 2059  TROPONINI <0.03   ------------------------------------------------------------------------------------------------------------------    Component Value Date/Time   BNP 363.2 (H) 05/19/2016 1037    Micro Results Recent Results (from the past 240 hour(s))  Blood culture (routine x 2)     Status: Abnormal (Preliminary result)   Collection Time: 02/14/17  5:05 PM  Result Value Ref Range Status   Specimen Description BLOOD RIGHT ANTECUBITAL  Final   Special Requests   Final    BOTTLES DRAWN AEROBIC AND ANAEROBIC Blood Culture adequate volume   Culture  Setup Time   Final    GRAM POSITIVE COCCI IN CLUSTERS CRITICAL RESULT CALLED TO, READ BACK BY  AND VERIFIED WITH: TO VBRYK(PHARMd) BY TCLEVELAND 02/16/2017 AT 12:59AM IN BOTH AEROBIC AND ANAEROBIC BOTTLES    Culture (A)  Final    STAPHYLOCOCCUS SPECIES (COAGULASE NEGATIVE) CULTURE REINCUBATED FOR BETTER GROWTH Performed at [facility], [place] [address], [place], [place] [address]    Report Status PENDING  Incomplete  Blood Culture ID Panel (Reflexed)     Status: Abnormal   Collection Time: 02/14/17  5:05 PM  Result Value Ref Range Status   Enterococcus species NOT DETECTED NOT DETECTED Final   Listeria monocytogenes NOT DETECTED NOT DETECTED Final   Staphylococcus species DETECTED (A) NOT DETECTED Final    Comment: Methicillin (oxacillin) resistant coagulase negative staphylococcus. Possible blood culture contaminant (  unless isolated from more than one blood culture draw or clinical case suggests pathogenicity). No antibiotic treatment is indicated for blood  culture contaminants. CRITICAL RESULT CALLED TO, READ BACK BY AND VERIFIED WITH: TO VBRYK(PHARMd) BY TCLEVELAND 02/16/2017 AT 12:59AM    Staphylococcus aureus NOT DETECTED NOT DETECTED Final   Methicillin resistance DETECTED (A) NOT DETECTED Final    Comment: CRITICAL RESULT CALLED TO, READ BACK BY AND VERIFIED WITH: TO VBRYK(PHARMd) BY TCLEVELAND 02/16/2017 AT 12:59AM    Streptococcus species NOT DETECTED NOT DETECTED Final   Streptococcus agalactiae NOT DETECTED NOT DETECTED Final   Streptococcus pneumoniae NOT DETECTED NOT DETECTED Final   Streptococcus pyogenes NOT DETECTED NOT DETECTED Final   Acinetobacter baumannii NOT DETECTED NOT DETECTED Final   Enterobacteriaceae species NOT DETECTED NOT DETECTED Final   Enterobacter cloacae complex NOT DETECTED NOT DETECTED Final   Escherichia coli NOT DETECTED NOT DETECTED Final   Klebsiella oxytoca NOT DETECTED NOT DETECTED Final   Klebsiella pneumoniae NOT DETECTED NOT DETECTED Final   Proteus species NOT DETECTED NOT DETECTED Final   Serratia marcescens NOT DETECTED  NOT DETECTED Final   Haemophilus influenzae NOT DETECTED NOT DETECTED Final   Neisseria meningitidis NOT DETECTED NOT DETECTED Final   Pseudomonas aeruginosa NOT DETECTED NOT DETECTED Final   Candida albicans NOT DETECTED NOT DETECTED Final   Candida glabrata NOT DETECTED NOT DETECTED Final   Candida krusei NOT DETECTED NOT DETECTED Final   Candida parapsilosis NOT DETECTED NOT DETECTED Final   Candida tropicalis NOT DETECTED NOT DETECTED Final    Comment: Performed at [facility], [place]. [address] [place], [place] [address]  Blood culture (routine x 2)     Status: None (Preliminary result)   Collection Time: 02/14/17  5:10 PM  Result Value Ref Range Status   Specimen Description LEFT ANTECUBITAL  Final   Special Requests IN PEDIATRIC BOTTLE Blood Culture adequate volume  Final   Culture   Final    NO GROWTH 3 DAYS Performed at [facility], [place] [address] [place], [place] [address]    Report Status PENDING  Incomplete  MRSA PCR Screening     Status: None   Collection Time: 02/16/17  1:00 AM  Result Value Ref Range Status   MRSA by PCR NEGATIVE NEGATIVE Final    Comment:        The GeneXpert MRSA Assay (FDA approved for NASAL specimens only), is one component of a comprehensive MRSA colonization surveillance program. It is not intended to diagnose MRSA infection nor to guide or monitor treatment for MRSA infections.     Radiology Reports Dg Tibia/fibula Left  Result Date: 02/14/2017 CLINICAL DATA:  Left lower extremity wound. EXAM: LEFT TIBIA AND FIBULA - 2 VIEW COMPARISON:  None. FINDINGS: The knee and ankle joints are maintained. No acute bony findings or destructive bony changes. Small soft tissue wound noted involving the midshaft region on the leg anteriorly. Vascular calcifications are noted. IMPRESSION: No acute bony findings. Electronically Signed   By: Marijo Sanes M.D.   On: 02/14/2017 16:50   Dg Foot Complete Right  Result Date: 02/14/2017 CLINICAL  DATA:  Open wounds involving the first and second toes. EXAM: RIGHT FOOT COMPLETE - 3+ VIEW COMPARISON:  None. FINDINGS: Destructive bony changes involving the second toe with findings consistent with septic arthritis at the PIP joint and osteomyelitis involving the adjacent proximal and middle phalanges. Degenerative changes at the first metatarsal phalangeal joint. No definite findings for osteomyelitis. Could  not exclude fractures of the fourth and fifth proximal phalanges. The metatarsals are intact. IMPRESSION: Plain film findings consistent with septic arthritis involving the PIP joint of the second toe with adjacent osteomyelitis involving the proximal and middle phalanges. Possible fractures of the fourth and fifth proximal phalanges. Electronically Signed   By: Marijo Sanes M.D.   On: 02/14/2017 16:48    Time Spent in minutes  30   Lala Lund M.D on 02/18/2017 at 10:43 AM  Between 7am to 7pm - Pager - [phone] ( page via [facility], text pages only, please mention full 10 digit call back number). After 7pm go to [email or web address] - password [facility]

## 2017-02-18 NOTE — Progress Notes (Signed)
Pharmacy Antibiotic Note  Ryan Pacheco is a 83 y.o. male facility resident admitted on 02/14/2017 with several recent superficial wounds that have become erythematous and/or purulent.  Imaging concerning for osteomyelitis and septic arthritis. On day #5 of vancomycin and zosyn. Ortho planning R 2nd toe amputation tomorrow. WBC 7.7, SCr stable at 1.  Goal trough to 15-20 mcg/mL   Plan: - Continue vancomycin at 1500 mg IV q24h - Continue Zosyn 3.375gm IV Q8H - Monitor renal fxn, cultures, VT today - Monitor duration of abx   Height: 5\' 7"  (170.2 cm) Weight: 160 lb 0.9 oz (72.6 kg) IBW/kg (Calculated) : 66.1  Temp (24hrs), Avg:98.5 F (36.9 C), Min:97.9 F (36.6 C), Max:99.8 F (37.7 C)   Recent Labs Lab 02/14/17 1606 02/14/17 1617 02/14/17 1904 02/15/17 0010 02/17/17 0303  WBC 9.9  --   --  9.5 7.7  CREATININE 1.05  --   --  1.00 1.04  LATICACIDVEN  --  1.34 1.44  --   --     Estimated Creatinine Clearance: 39.7 mL/min (by C-G formula based on SCr of 1.04 mg/dL).    Allergies  Allergen Reactions  . No Known Allergies      Vanc 4/20 >> Zosyn 4/20 >>  4/20 blood cx: 1/2 CoNS 4/22 mrsa pcr: neg   Hughes Better, PharmD, BCPS Clinical Pharmacist 02/18/2017 10:37 AM

## 2017-02-18 NOTE — Consult Note (Signed)
[facility] CM Primary Care Navigator  02/18/2017  LYNTON CRESCENZO 1920/08/22 [id number]   Met with patient's daughter [name]) at the bedside to identify possible discharge needs while patient had been sleeping for the rest of the visit. Daughter reports that patient had injured his right foot from his electric wheelchair few weeks ago and the wound had worsened with increased pain/ discomfort which led to this admission and possible surgery.   Patient's daughter endorsed Dr. Maury Dus with [facility] as the primary care provider.  Daughter reports that patient has been residing at [facility] for more than a year and his care needs has been provided by facility staff which includesadministering of his medications from facility pharmacy [facility]).  According to daughter, anticipated discharge plan is to go to a skilled nursing facility for rehabilitation with hopes of returning back to [facility].   Daughter voiced understanding to call primary care provider's office when patient returns home ([facility]), for a post discharge follow-up appointment within a week or sooner if needed. Patient letter (with PCP's contact number) was provided as a reminder.  Patient's daughter reports that his primary care provider had discontinued medications  for DM a while back since he did not need it. Current A1c is 5.4.   Daughter denied any other health management needs or concerns at this time. [facility] care management contact information provided for future needs that may arise.   For questions, please contact:  Dannielle Huh, BSN, RN- [facility] Primary Care Navigator  Telephone: [phone] [place]

## 2017-02-19 ENCOUNTER — Inpatient Hospital Stay (HOSPITAL_COMMUNITY): Payer: Medicare Other | Admitting: Anesthesiology

## 2017-02-19 ENCOUNTER — Other Ambulatory Visit (INDEPENDENT_AMBULATORY_CARE_PROVIDER_SITE_OTHER): Payer: Self-pay | Admitting: Family

## 2017-02-19 ENCOUNTER — Encounter (HOSPITAL_COMMUNITY): Payer: Self-pay | Admitting: Anesthesiology

## 2017-02-19 ENCOUNTER — Encounter (HOSPITAL_COMMUNITY)
Admission: EM | Disposition: A | Discharge status: Skilled Nursing Facility | Payer: Self-pay | Source: Home / Self Care | Attending: Internal Medicine

## 2017-02-19 HISTORY — PX: AMPUTATION: SHX166

## 2017-02-19 LAB — BASIC METABOLIC PANEL
ANION GAP: 7 (ref 5–15)
BUN: 19 mg/dL (ref 6–20)
CO2: 24 mmol/L (ref 22–32)
Calcium: 8.8 mg/dL — ABNORMAL LOW (ref 8.9–10.3)
Chloride: 110 mmol/L (ref 101–111)
Creatinine, Ser: 1.07 mg/dL (ref 0.61–1.24)
GFR calc Af Amer: >60 mL/min (ref >60)
GFR, EST NON AFRICAN AMERICAN: 57 mL/min — AB (ref >60)
GLUCOSE: 91 mg/dL (ref 65–99)
POTASSIUM: 4.1 mmol/L (ref 3.5–5.1)
Sodium: 141 mmol/L (ref 135–145)

## 2017-02-19 LAB — GLUCOSE, CAPILLARY
GLUCOSE-CAPILLARY: 92 mg/dL (ref 65–99)
GLUCOSE-CAPILLARY: 133 mg/dL — AB (ref 65–99)
Glucose-Capillary: 81 mg/dL (ref 65–99)
Glucose-Capillary: 96 mg/dL (ref 65–99)
Glucose-Capillary: 89 mg/dL (ref 65–99)

## 2017-02-19 LAB — CBC
HEMATOCRIT: 37.6 % — AB (ref 39.0–52.0)
Hemoglobin: 12.2 g/dL — ABNORMAL LOW (ref 13.0–17.0)
MCH: 30.2 pg (ref 26.0–34.0)
MCHC: 32.4 g/dL (ref 30.0–36.0)
MCV: 93.1 fL (ref 78.0–100.0)
Platelets: 220 10*3/uL (ref 150–400)
RBC: 4.04 MIL/uL — ABNORMAL LOW (ref 4.22–5.81)
RDW: 14.2 % (ref 11.5–15.5)
WBC: 8 10*3/uL (ref 4.0–10.5)

## 2017-02-19 LAB — CULTURE, BLOOD (ROUTINE X 2)
Culture: NO GROWTH
SPECIAL REQUESTS: ADEQUATE

## 2017-02-19 SURGERY — AMPUTATION, FOOT, RAY
Anesthesia: Monitor Anesthesia Care | Site: Toe | Laterality: Right

## 2017-02-19 MED ORDER — METHOCARBAMOL 1000 MG/10ML IJ SOLN: 500.0000 mg | Freq: Four times a day (QID) | INTRAVENOUS | Status: DC | PRN

## 2017-02-19 MED ORDER — METHOCARBAMOL 500 MG PO TABS: 500.0000 mg | ORAL_TABLET | Freq: Four times a day (QID) | ORAL | Status: DC | PRN

## 2017-02-19 MED ORDER — METOCLOPRAMIDE HCL 5 MG/ML IJ SOLN: 5.0000 mg | Freq: Three times a day (TID) | INTRAMUSCULAR | Status: DC | PRN

## 2017-02-19 MED ORDER — POLYETHYLENE GLYCOL 3350 17 G PO PACK: 17.0000 g | PACK | Freq: Every day | ORAL | Status: DC

## 2017-02-19 MED ORDER — ONDANSETRON HCL 4 MG PO TABS: 4.0000 mg | ORAL_TABLET | Freq: Four times a day (QID) | ORAL | Status: DC | PRN

## 2017-02-19 MED ORDER — CHLORHEXIDINE GLUCONATE 4 % EX LIQD
60.0000 mL | Freq: Once | CUTANEOUS | Status: AC
  Administered 2017-02-19: 4 via TOPICAL

## 2017-02-19 MED ORDER — MIDAZOLAM HCL 2 MG/2ML IJ SOLN
INTRAMUSCULAR | Status: AC
  Filled 2017-02-19: qty 2

## 2017-02-19 MED ORDER — LIDOCAINE HCL 1 % IJ SOLN
INTRAMUSCULAR | Status: DC | PRN
  Administered 2017-02-19: 8 mL

## 2017-02-19 MED ORDER — CEFAZOLIN SODIUM-DEXTROSE 2-4 GM/100ML-% IV SOLN: 2.0000 g | INTRAVENOUS | Status: DC

## 2017-02-19 MED ORDER — SODIUM CHLORIDE 0.9 % IV SOLN
INTRAVENOUS | Status: DC
  Administered 2017-02-19: 15:00:00 via INTRAVENOUS

## 2017-02-19 MED ORDER — BISACODYL 10 MG RE SUPP: 10.0000 mg | Freq: Every day | RECTAL | Status: DC | PRN

## 2017-02-19 MED ORDER — METOCLOPRAMIDE HCL 5 MG PO TABS: 5.0000 mg | ORAL_TABLET | Freq: Three times a day (TID) | ORAL | Status: DC | PRN

## 2017-02-19 MED ORDER — PIPERACILLIN-TAZOBACTAM 3.375 G IVPB
3.3750 g | Freq: Three times a day (TID) | INTRAVENOUS | Status: DC
  Administered 2017-02-19 – 2017-02-20 (×2): 3.375 g via INTRAVENOUS
  Filled 2017-02-19 (×3): qty 50

## 2017-02-19 MED ORDER — SENNOSIDES-DOCUSATE SODIUM 8.6-50 MG PO TABS
1.0000 | ORAL_TABLET | Freq: Two times a day (BID) | ORAL | Status: DC
  Administered 2017-02-19 – 2017-02-20 (×2): 1 via ORAL
  Filled 2017-02-19 (×2): qty 1

## 2017-02-19 MED ORDER — 0.9 % SODIUM CHLORIDE (POUR BTL) OPTIME
TOPICAL | Status: DC | PRN
  Administered 2017-02-19: 1000 mL

## 2017-02-19 MED ORDER — HYDROMORPHONE HCL 1 MG/ML IJ SOLN: 1.0000 mg | INTRAMUSCULAR | Status: DC | PRN

## 2017-02-19 MED ORDER — POLYETHYLENE GLYCOL 3350 17 G PO PACK: 17.0000 g | PACK | Freq: Every day | ORAL | Status: DC | PRN

## 2017-02-19 MED ORDER — PIPERACILLIN-TAZOBACTAM 3.375 G IVPB 30 MIN
3.3750 g | INTRAVENOUS | Status: DC
  Filled 2017-02-19: qty 50

## 2017-02-19 MED ORDER — ONDANSETRON HCL 4 MG/2ML IJ SOLN: 4.0000 mg | Freq: Four times a day (QID) | INTRAMUSCULAR | Status: DC | PRN

## 2017-02-19 MED ORDER — DOCUSATE SODIUM 100 MG PO CAPS
100.0000 mg | ORAL_CAPSULE | Freq: Two times a day (BID) | ORAL | Status: DC
  Administered 2017-02-19 – 2017-02-20 (×2): 100 mg via ORAL
  Filled 2017-02-19 (×3): qty 1

## 2017-02-19 MED ORDER — FENTANYL CITRATE (PF) 100 MCG/2ML IJ SOLN
INTRAMUSCULAR | Status: DC | PRN
  Administered 2017-02-19: 50 ug via INTRAVENOUS

## 2017-02-19 MED ORDER — ACETAMINOPHEN 325 MG PO TABS: 650.0000 mg | ORAL_TABLET | Freq: Four times a day (QID) | ORAL | Status: DC | PRN

## 2017-02-19 MED ORDER — FENTANYL CITRATE (PF) 100 MCG/2ML IJ SOLN
INTRAMUSCULAR | Status: AC
  Filled 2017-02-19: qty 2

## 2017-02-19 MED ORDER — LACTATED RINGERS IV SOLN
INTRAVENOUS | Status: DC
  Administered 2017-02-19: 12:00:00 via INTRAVENOUS

## 2017-02-19 MED ORDER — ACETAMINOPHEN 650 MG RE SUPP: 650.0000 mg | Freq: Four times a day (QID) | RECTAL | Status: DC | PRN

## 2017-02-19 MED ORDER — MAGNESIUM CITRATE PO SOLN: 1.0000 | Freq: Once | ORAL | Status: DC | PRN

## 2017-02-19 MED ORDER — OXYCODONE HCL 5 MG PO TABS: 5.0000 mg | ORAL_TABLET | ORAL | Status: DC | PRN

## 2017-02-19 MED ORDER — PROPOFOL 500 MG/50ML IV EMUL
INTRAVENOUS | Status: DC | PRN
  Administered 2017-02-19: 25 ug/kg/min via INTRAVENOUS

## 2017-02-19 MED ORDER — PROPOFOL 10 MG/ML IV BOLUS
INTRAVENOUS | Status: DC | PRN
  Administered 2017-02-19: 10 mg via INTRAVENOUS

## 2017-02-19 MED ORDER — FENTANYL CITRATE (PF) 250 MCG/5ML IJ SOLN
INTRAMUSCULAR | Status: AC
  Filled 2017-02-19: qty 5

## 2017-02-19 MED ORDER — LIDOCAINE HCL 1 % IJ SOLN
INTRAMUSCULAR | Status: AC
  Filled 2017-02-19: qty 20

## 2017-02-19 SURGICAL SUPPLY — 36 items
BLADE SAW SGTL MED 73X18.5 STR (BLADE) IMPLANT
BLADE SURG 21 STRL SS (BLADE) ×3 IMPLANT
BNDG COHESIVE 4X5 TAN STRL (GAUZE/BANDAGES/DRESSINGS) ×3 IMPLANT
BNDG GAUZE ELAST 4 BULKY (GAUZE/BANDAGES/DRESSINGS) ×3 IMPLANT
COVER SURGICAL LIGHT HANDLE (MISCELLANEOUS) ×4 IMPLANT
DECANTER SPIKE VIAL GLASS SM (MISCELLANEOUS) ×2 IMPLANT
DRAPE U-SHAPE 47X51 STRL (DRAPES) ×6 IMPLANT
DRSG ADAPTIC 3X8 NADH LF (GAUZE/BANDAGES/DRESSINGS) ×3 IMPLANT
DRSG PAD ABDOMINAL 8X10 ST (GAUZE/BANDAGES/DRESSINGS) ×6 IMPLANT
DURAPREP 26ML APPLICATOR (WOUND CARE) ×3 IMPLANT
ELECT REM PT RETURN 9FT ADLT (ELECTROSURGICAL) ×3
ELECTRODE REM PT RTRN 9FT ADLT (ELECTROSURGICAL) ×1 IMPLANT
GAUZE SPONGE 4X4 12PLY STRL (GAUZE/BANDAGES/DRESSINGS) ×3 IMPLANT
GAUZE SPONGE 4X4 12PLY STRL LF (GAUZE/BANDAGES/DRESSINGS) ×2 IMPLANT
GLOVE BIO SURGEON STRL SZ 6.5 (GLOVE) ×1 IMPLANT
GLOVE BIO SURGEONS STRL SZ 6.5 (GLOVE) ×1
GLOVE BIOGEL PI IND STRL 7.5 (GLOVE) IMPLANT
GLOVE BIOGEL PI IND STRL 9 (GLOVE) ×1 IMPLANT
GLOVE BIOGEL PI INDICATOR 7.5 (GLOVE) ×2
GLOVE BIOGEL PI INDICATOR 9 (GLOVE) ×2
GLOVE SURG ORTHO 9.0 STRL STRW (GLOVE) ×3 IMPLANT
GLOVE SURG SS PI 6.5 STRL IVOR (GLOVE) ×2 IMPLANT
GOWN STRL REUS W/ TWL LRG LVL3 (GOWN DISPOSABLE) IMPLANT
GOWN STRL REUS W/ TWL XL LVL3 (GOWN DISPOSABLE) ×2 IMPLANT
GOWN STRL REUS W/TWL LRG LVL3 (GOWN DISPOSABLE) ×6
GOWN STRL REUS W/TWL XL LVL3 (GOWN DISPOSABLE) ×6
KIT BASIN OR (CUSTOM PROCEDURE TRAY) ×3 IMPLANT
KIT ROOM TURNOVER OR (KITS) ×3 IMPLANT
NS IRRIG 1000ML POUR BTL (IV SOLUTION) ×3 IMPLANT
PACK ORTHO EXTREMITY (CUSTOM PROCEDURE TRAY) ×3 IMPLANT
PAD ABD 8X10 STRL (GAUZE/BANDAGES/DRESSINGS) ×2 IMPLANT
PAD ARMBOARD 7.5X6 YLW CONV (MISCELLANEOUS) ×4 IMPLANT
STOCKINETTE IMPERVIOUS LG (DRAPES) IMPLANT
SUT ETHILON 2 0 PSLX (SUTURE) ×5 IMPLANT
SYR CONTROL 10ML LL (SYRINGE) ×2 IMPLANT
TOWEL OR 17X26 10 PK STRL BLUE (TOWEL DISPOSABLE) ×3 IMPLANT

## 2017-02-19 NOTE — Op Note (Signed)
02/14/2017 - 02/19/2017  1:45 PM  PATIENT:  Ryan Pacheco    PRE-OPERATIVE DIAGNOSIS:  Osteomyelitis Right 2nd Toe  POST-OPERATIVE DIAGNOSIS:  Same  PROCEDURE:  2nd toeToe Amputation Right Foot  SURGEON:  Newt Minion, MD  PHYSICIAN ASSISTANT:None ANESTHESIA:   General  PREOPERATIVE INDICATIONS:  [name] is a  83 y.o. male with a diagnosis of Osteomyelitis Right 2nd Toe who failed conservative measures and elected for surgical management.    The risks benefits and alternatives were discussed with the patient preoperatively including but not limited to the risks of infection, bleeding, nerve injury, cardiopulmonary complications, the need for revision surgery, among others, and the patient was willing to proceed.  OPERATIVE IMPLANTS: None  OPERATIVE FINDINGS: Good petechial bleeding  OPERATIVE PROCEDURE: Patient brought to operating room and underwent a regional local anesthetic. After adequate levels anesthesia were obtained patient's right lower extremity was prepped using DuraPrep draped into a sterile field a timeout was called. A racquet incision was made around the second toe. The second toe was amputated through the MTP joint. Electrocautery was used for hemostasis. The wound was irrigated with normal saline. The incision was closed using 2-0 nylon. A sterile dressing was applied. Patient was taken to PACU in stable condition.

## 2017-02-19 NOTE — Anesthesia Postprocedure Evaluation (Addendum)
Anesthesia Post Note  Patient: Ryan Pacheco  Procedure(s) Performed: Procedure(s) (LRB): 2nd Ray Amputation Right Foot (Right)  Patient location during evaluation: PACU Anesthesia Type: Regional Level of consciousness: sedated Pain management: pain level controlled Vital Signs Assessment: post-procedure vital signs reviewed and stable Respiratory status: spontaneous breathing Cardiovascular status: stable Postop Assessment: no signs of nausea or vomiting Anesthetic complications: no        Last Vitals:  Vitals:   02/19/17 1410 02/19/17 1415  BP: 139/61   Pulse: (!) 54 (!) 54  Resp: 10 (!) 9  Temp:      Last Pain:  Vitals:   02/19/17 1410  TempSrc:   PainSc: Asleep   Pain Goal:                 Muzammil Bruins JR,JOHN Kitana Gage

## 2017-02-19 NOTE — Progress Notes (Signed)
[facility] Progress Note  Patient: Ryan Pacheco CBJ:[id number]   PCP: [name], MD DOB: 13-Nov-1919   DOA: 02/14/2017   DOS: 02/19/2017   Date of Service: the patient was seen and examined on 02/19/2017  Subjective: Patient has been sleepy today, no acute events no acute complaints.  Brief hospital course: Pt. with PMH of hypertension, type II DM, CK-MB stage III, BPH S/P suprapubic catheter; admitted on 02/14/2017, with complaint of infected toe, was found to have right second toe osteomyelitis. Currently further plan is follow postoperative recovery after surgery.  Assessment and Plan: 1. Acute osteomyelitis of toe of right foot (HCC) Traumatic right foot injury, multiple toe fractures. No evidence of sepsis but on IV antibiotics at present. Orthopedic consulted recommend first ray amputation. Scheduled for 02/19/2017. Patient is a moderate to high risk for surgery due to his age as well as comorbidities. Started on IV antibiotics empirically, we'll request surgery to send for culture. Monitor postoperative course. One out of 2 cultures positive for coagulase-negative Staphylococcus, likely contamination.  2. Chronic suprapubic Foley catheter. History of BPH.  will monitor at present.  3. Essential hypertension. On when necessary hydralazine.  4. Hyperglycemia. Patient was diagnosed with diabetes mellitus although his A1c has never been more than 6.5. This admission his A1c is also 5.4. Given active infection continue with sensitive sliding scale but no indication to continue this as an outpatient.  5. GERD. Continue PPI.  6. Lethargy. Likely in the setting of active infection, noted today. We'll continue to monitor. High chances of postoperative delirium.  7. Constipation. Last BM 02/15/2017. Starting on MiraLAX and Senokot.  Bowel regimen: last BM 02/15/2017 Diet: cardiac diet DVT Prophylaxis: subcutaneous Heparin  Advance goals of care  discussion: DNR DNI  Family Communication: family was present at bedside, at the time of interview. The pt provided permission to discuss medical plan with the family. Opportunity was given to ask question and all questions were answered satisfactorily.   Disposition:  Discharge to SNF. Expected discharge date: 02/21/2017,   Consultants: ortho[edics Procedures: none  Antibiotics: Anti-infectives    Start     Dose/Rate Route Frequency Ordered Stop   02/19/17 2000  piperacillin-tazobactam (ZOSYN) IVPB 3.375 g     3.375 g 12.5 mL/hr over 240 Minutes Intravenous Every 8 hours 02/19/17 0813     02/19/17 1200  piperacillin-tazobactam (ZOSYN) IVPB 3.375 g     3.375 g 100 mL/hr over 30 Minutes Intravenous To [facility] 02/19/17 0813 02/20/17 1200   02/19/17 0815  ceFAZolin (ANCEF) IVPB 2g/100 mL premix  Status:  Discontinued     2 g 200 mL/hr over 30 Minutes Intravenous On call to O.R. 02/19/17 1607 02/19/17 0813   02/15/17 1700  vancomycin (VANCOCIN) 1,500 mg in sodium chloride 0.9 % 500 mL IVPB     1,500 mg 250 mL/hr over 120 Minutes Intravenous Every 24 hours 02/15/17 1058     02/15/17 0000  piperacillin-tazobactam (ZOSYN) IVPB 3.375 g  Status:  Discontinued     3.375 g 12.5 mL/hr over 240 Minutes Intravenous Every 8 hours 02/14/17 1731 02/19/17 0813   02/14/17 1800  vancomycin (VANCOCIN) 1,250 mg in sodium chloride 0.9 % 250 mL IVPB  Status:  Discontinued     1,250 mg 166.7 mL/hr over 90 Minutes Intravenous Every 24 hours 02/14/17 1731 02/15/17 1058   02/14/17 1800  piperacillin-tazobactam (ZOSYN) IVPB 3.375 g     3.375 g 100 mL/hr over 30 Minutes Intravenous  Once 02/14/17 1731 02/14/17 1841  Objective: Physical Exam: Vitals:   02/18/17 0423 02/18/17 1700 02/18/17 2030 02/19/17 0551  BP: (!) 141/51 (!) 124/48 (!) 146/54 (!) 143/51  Pulse: (!) 55 (!) 51 (!) 47 (!) 59  Resp: 16 16 16 16   Temp: 97.9 F (36.6 C) 97.6 F (36.4 C) 98.1 F (36.7 C) 98.1 F (36.7 C)   TempSrc: Oral Oral Oral Axillary  SpO2: 96% 98% 97% 97%  Weight:      Height:        Intake/Output Summary (Last 24 hours) at 02/19/17 1054 Last data filed at 02/19/17 0550  Gross per 24 hour  Intake              120 ml  Output             4600 ml  Net            -4480 ml   Filed Weights   02/15/17 1027  Weight: 72.6 kg (160 lb 0.9 oz)   General: Alert, Awake. Appear in mild distress, affect appropriate Eyes: PERRL, Conjunctiva red after eye drops, no discomfort ENT: Oral Mucosa clear moist. Neck: difficult to assess JVD, bno Abnormal Mass Or lumps Cardiovascular: S1 and S2 Present,no Murmur, Respiratory: Bilateral Air entry equal and Decreased, no use of accessory muscle, Clear to Auscultation, no Crackles, no wheezes Abdomen: Bowel Sound present, Soft and no tenderness Skin: right leg redness, no Rash, no induration Extremities: right Pedal edema, no calf tenderness Neurologic: Grossly no focal neuro deficit. Bilaterally Equal motor strength  Data Reviewed: CBC:  Recent Labs Lab 02/14/17 1606 02/15/17 0010 02/17/17 0303 02/19/17 0808  WBC 9.9 9.5 7.7 8.0  NEUTROABS 6.9  --   --   --   HGB 12.2* 12.0* 11.9* 12.2*  HCT 37.7* 36.8* 36.9* 37.6*  MCV 91.1 92.5 92.3 93.1  PLT 235 234 224 837   Basic Metabolic Panel:  Recent Labs Lab 02/14/17 1606 02/15/17 0010 02/17/17 0303 02/19/17 0808  NA 141 145 142 141  K 3.9 4.3 4.6 4.1  CL 111 109 107 110  CO2 26 24 28 24   GLUCOSE 124* 103* 100* 91  BUN 19 16 19 19   CREATININE 1.05 1.00 1.04 1.07  CALCIUM 8.6* 8.9 8.9 8.8*  MG  --  2.0  --   --   PHOS  --  3.5  --   --     Liver Function Tests:  Recent Labs Lab 02/15/17 0010  AST 18  ALT 19  ALKPHOS 64  BILITOT 0.7  PROT 6.0*  ALBUMIN 3.0*   No results for input(s): LIPASE, AMYLASE in the last 168 hours. No results for input(s): AMMONIA in the last 168 hours. Coagulation Profile:  Recent Labs Lab 02/17/17 0303  INR 1.09   Cardiac  Enzymes:  Recent Labs Lab 02/14/17 2059  TROPONINI <0.03   BNP (last 3 results) No results for input(s): PROBNP in the last 8760 hours. CBG:  Recent Labs Lab 02/18/17 0721 02/18/17 1128 02/18/17 1645 02/18/17 2132 02/19/17 0625  GLUCAP 83 91 85 91 81   Studies: No results found.  Scheduled Meds: . aspirin EC  81 mg Oral Daily  . atorvastatin  5 mg Oral Q2000  . barrier cream  1 application Topical BID  . feeding supplement (ENSURE ENLIVE)  237 mL Oral BID BM  . feeding supplement (PRO-STAT SUGAR FREE 64)  30 mL Oral BID  . heparin  5,000 Units Subcutaneous Q8H  . insulin aspart  0-5 Units Subcutaneous  QHS  . insulin aspart  0-9 Units Subcutaneous TID WC  . multivitamin with minerals  1 tablet Oral Daily  . pantoprazole  40 mg Oral Daily  . polyvinyl alcohol  2 drop Both Eyes QID  . potassium chloride SA  20 mEq Oral Daily  . sodium chloride flush  3 mL Intravenous Q12H   Continuous Infusions: . sodium chloride    . piperacillin-tazobactam    . piperacillin-tazobactam (ZOSYN)  IV    . vancomycin Stopped (02/18/17 2014)   PRN Meds: sodium chloride, acetaminophen, docusate sodium, hydrALAZINE, ondansetron **OR** ondansetron (ZOFRAN) IV, oxyCODONE, sodium chloride flush  Time spent: 30 minutes  Author: Berle Mull, MD [facility] Pager: [phone] 02/19/2017 10:54 AM  If 7PM-7AM, please contact night-coverage at [email or web address], password [facility]

## 2017-02-19 NOTE — Transfer of Care (Signed)
Immediate Anesthesia Transfer of Care Note  Patient: Ryan Pacheco  Procedure(s) Performed: Procedure(s): 2nd Ray Amputation Right Foot (Right)  Patient Location: PACU  Anesthesia Type:MAC and Regional  Level of Consciousness: awake, alert  and patient cooperative  Airway & Oxygen Therapy: Patient Spontanous Breathing and Patient connected to nasal cannula oxygen  Post-op Assessment: Report given to RN and Post -op Vital signs reviewed and stable  Post vital signs: Reviewed and stable  Last Vitals:  Vitals:   02/18/17 2030 02/19/17 0551  BP: (!) 146/54 (!) 143/51  Pulse: (!) 47 (!) 59  Resp: 16 16  Temp: 36.7 C 36.7 C    Last Pain:  Vitals:   02/19/17 0551  TempSrc: Axillary  PainSc:          Complications: No apparent anesthesia complications

## 2017-02-19 NOTE — Interval H&P Note (Signed)
History and Physical Interval Note:  02/19/2017 6:19 AM  Chuck Hint  has presented today for surgery, with the diagnosis of Osteomyelitis Right 2nd Toe  The various methods of treatment have been discussed with the patient and family. After consideration of risks, benefits and other options for treatment, the patient has consented to  Procedure(s): 2nd Ray Amputation Right Foot (Right) as a surgical intervention .  The patient's history has been reviewed, patient examined, no change in status, stable for surgery.  I have reviewed the patient's chart and labs.  Questions were answered to the patient's satisfaction.     Newt Minion

## 2017-02-19 NOTE — Anesthesia Preprocedure Evaluation (Signed)
Anesthesia Evaluation  Patient identified by MRN, date of birth, ID band Patient awake    Reviewed: Allergy & Precautions, NPO status , Patient's Chart, lab work & pertinent test results  Airway Mallampati: I  TM Distance: >3 FB Neck ROM: Full    Dental   Pulmonary former smoker,    Pulmonary exam normal breath sounds clear to auscultation       Cardiovascular + CAD, + Past MI and + CABG  Normal cardiovascular exam Rhythm:Regular Rate:Normal     Neuro/Psych negative neurological ROS  negative psych ROS   GI/Hepatic Neg liver ROS, GERD  Medicated,  Endo/Other  diabetes, Type 2  Renal/GU      Musculoskeletal negative musculoskeletal ROS (+)   Abdominal Normal abdominal exam  (+)   Peds negative pediatric ROS (+)  Hematology negative hematology ROS (+)   Anesthesia Other Findings Date of Service: 02/19/2017 10:54 AM Lavina Hamman, MD  Internal Medicine  Expand All Collapse All    Hide copied text Hover for attribution information [facility] Progress Note   Patient: Ryan Pacheco ERD:[id number]  PCP: [name], MD DOB: 1920-06-19  DOA: 02/14/2017             DOS: 02/19/2017  Date of Service: the patient was seen and examined on 02/19/2017  Subjective: Patient has been sleepy today, no acute events no acute complaints.  Brief hospital course: Pt. with PMH of hypertension, type II DM, CK-MB stage III, BPH S/P suprapubic catheter; admitted on 02/14/2017, with complaint of infected toe, was found to have right second toe osteomyelitis. Currently further plan is follow postoperative recovery after surgery.  Assessment and Plan: 1. Acute osteomyelitis of toe of right foot (HCC) Traumatic right foot injury, multiple toe fractures. No evidence of sepsis but on IV antibiotics at present. Orthopedic consulted recommend first ray amputation. Scheduled for 02/19/2017. Patient is a moderate to high  risk for surgery due to his age as well as comorbidities. Started on IV antibiotics empirically, we'll request surgery to send for culture. Monitor postoperative course. One out of 2 cultures positive for coagulase-negative Staphylococcus, likely contamination.  2. Chronic suprapubic Foley catheter. History of BPH.  will monitor at present.  3. Essential hypertension. On when necessary hydralazine.  4. Hyperglycemia. Patient was diagnosed with diabetes mellitus although his A1c has never been more than 6.5. This admission his A1c is also 5.4. Given active infection continue with sensitive sliding scale but no indication to continue this as an outpatient.  5. GERD. Continue PPI.  6. Lethargy. Likely in the setting of active infection, noted today. We'll continue to monitor. High chances of postoperative delirium.  7. Constipation. Last BM 02/15/2017. Starting on MiraLAX and Senokot.  Bowel regimen: last BM 02/15/2017 Diet: cardiac diet DVT Prophylaxis: subcutaneous Heparin  Advance goals of care discussion: DNR DNI  Family Communication: family was present at bedside, at the time of interview. The pt provided permission to discuss medical plan with the family. Opportunity was given to ask question and all questions were answered satisfactorily.   Disposition:  Discharge to SNF. Expected discharge date: 02/21/2017,   Consultants: ortho(edics Procedures: none  Antibiotics:  Anti-infectives   Start     Dose/Rate Route Frequency Ordered Stop  02/19/17 2000   piperacillin-tazobactam (ZOSYN) IVPB 3.375 g    3.375 g 12.5 mL/hr over 240          Reproductive/Obstetrics  Anesthesia Physical  Anesthesia Plan  ASA: III  Anesthesia Plan: MAC and Regional   Post-op Pain Management:    Induction: Intravenous  Airway Management Planned: Natural Airway, Simple Face Mask and Nasal  Cannula  Additional Equipment:   Intra-op Plan:   Post-operative Plan:   Informed Consent: I have reviewed the patients History and Physical, chart, labs and discussed the procedure including the risks, benefits and alternatives for the proposed anesthesia with the patient or authorized representative who has indicated his/her understanding and acceptance.   Dental advisory given  Plan Discussed with: CRNA and Surgeon  Anesthesia Plan Comments: (MAC with ketamine for last suprapubic tube placement)        Anesthesia Quick Evaluation

## 2017-02-19 NOTE — H&P (View-Only) (Signed)
ORTHOPAEDIC CONSULTATION  REQUESTING PHYSICIAN: Thurnell Lose, MD  Chief Complaint: Ulceration and exposed bone right foot second toe  HPI: Ryan Pacheco is a 83 y.o. male who presents with diabetes and peripheral vascular disease with osteomyelitis ulceration right foot second toe  Past Medical History:  Diagnosis Date  . Acid reflux   . Angina   . Arthritis 01-09-12   hands, foot  . Benign prostatic hypertrophy   . Bradycardia   . Chronic kidney disease    Stage 1  through Stage 4 of unspecified chronic kidney disease.  . Diabetes mellitus   . Edema   . Embolism - blood clot ~ 2000   "behind left knee"  . Foley catheter in place    at present to leg bag  . Hearing impaired person, bilateral    right ear hearing aid-"loss other"  . History of asbestos exposure    "have some lung disease from years of exposure"  . Impaired gait and mobility    ambulates with walker- prone to frequent falls  . Myocardial infarction [facility]) 11/17/10    NSTEMI- Dr. Irish Lack follows  . Prostate hypertrophy 01-09-12   urinary retention-Foley catheter at present; surgery planned   Past Surgical History:  Procedure Laterality Date  . BLEPHAROPLASTY     left eye  . CARDIAC CATHETERIZATION  01-09-12   1'13  . CATARACT EXTRACTION, BILATERAL  ~ 2010  . CHOLECYSTECTOMY  1980's  . CORONARY ARTERY BYPASS GRAFT  11/26/2011   Procedure:x4- CORONARY ARTERY BYPASS GRAFTING (CABG);  Surgeon: Gaye Pollack, MD;  Location: [facility];  Service: Open Heart Surgery;  Laterality: N/A;  . INSERTION OF SUPRAPUBIC CATHETER N/A 08/05/2016   Procedure: CYSTOSCOPY AND INSERTION OF SUPRAPUBIC CATHETER;  Surgeon: Carolan Clines, MD;  Location: [facility];  Service: Urology;  Laterality: N/A;  . LEFT HEART CATHETERIZATION WITH CORONARY ANGIOGRAM N/A 11/20/2011   Procedure: LEFT HEART CATHETERIZATION WITH CORONARY ANGIOGRAM;  Surgeon: Jettie Booze, MD;  Location[facility] C[facility] LAB;  Service: Cardiovascular;  Laterality:  N/A;  possible PCI  . Suprapubic tube     3'13, then removed.   Social History   Social History  . Marital status: Widowed    Spouse name: N/A  . Number of children: N/A  . Years of education: N/A   Social History Main Topics  . Smoking status: Former Smoker    Types: Cigars  . Smokeless tobacco: Former Systems developer    Types: Chew     Comment: "hadn't used chew or smoked cigars since 1990's  . Alcohol use No  . Drug use: No  . Sexual activity: No   Other Topics Concern  . None   Social History Narrative  . None   Family History  Problem Relation Age of Onset  . Heart attack Father   . Hypertension Neg Hx   . Stroke Neg Hx    - negative except otherwise stated in the family history section No Known Allergies Prior to Admission medications   Medication Sig Start Date End Date Taking? Authorizing Provider  aspirin EC 81 MG tablet Take 81 mg by mouth daily.   Yes Historical Provider, MD  atorvastatin (LIPITOR) 10 MG tablet Take 5 mg by mouth daily at 8 pm.    Yes Historical Provider, MD  docusate sodium (COLACE) 100 MG capsule Take 100 mg by mouth 2 (two) times daily.   Yes Historical Provider, MD  guaiFENesin (MUCINEX) 600 MG 12 hr tablet Take 600  mg by mouth 2 (two) times daily.   Yes Historical Provider, MD  methylcellulose (ARTIFICIAL TEARS) 1 % ophthalmic solution Place 2 drops into both eyes 4 (four) times daily.   Yes Historical Provider, MD  Multiple Vitamin (MULITIVITAMIN WITH MINERALS) TABS Take 1 tablet by mouth daily.   Yes Historical Provider, MD  nystatin (NYSTATIN) powder Apply 1 g topically 2 (two) times daily.   Yes Historical Provider, MD  pantoprazole (PROTONIX) 40 MG tablet Take 40 mg by mouth daily.    Yes Historical Provider, MD  potassium chloride SA (K-DUR,KLOR-CON) 20 MEQ tablet Take 20 mEq by mouth daily.    Yes Historical Provider, MD  Skin Protectants, Misc. (DIMETHICONE-ZINC OXIDE) cream Apply 1 application topically 2 (two) times daily. BAZA PROTECT  CREAM-apply skin prep to reddened area; apply to protect from moisture.   Yes Historical Provider, MD  acetaminophen (TYLENOL) 325 MG tablet Take 650 mg by mouth every 6 (six) hours as needed for mild pain.    Historical Provider, MD  dextromethorphan (DELSYM) 30 MG/5ML liquid Take 60 mg by mouth 2 (two) times daily as needed for cough.    Historical Provider, MD   No results found. - pertinent xrays, CT, MRI studies were reviewed and independently interpreted  Positive ROS: All other systems have been reviewed and were otherwise negative with the exception of those mentioned in the HPI and as above.  Physical Exam: General: Alert, no acute distress,Hard of hearing Psychiatric: Patient is competent for consent with normal mood and affect Lymphatic: No axillary or cervical lymphadenopathy Cardiovascular: No pedal edema Respiratory: No cyanosis, no use of accessory musculature GI: No organomegaly, abdomen is soft and non-tender  Skin: Examination patient has an ulcer dorsally over the second toe PIP joint. There is exposed bone there is swelling.   Neurologic: Patient does not have protective sensation bilateral lower extremities.   MUSCULOSKELETAL:  Patient has a good dorsalis pedis pulse. The skin is thin and atrophic in his right foot. He has ulceration with osteomyelitis of the right foot second toe. There is no ascending cellulitis. No purulent drainage.  Assessment: Assessment: Diabetic insensate neuropathy peripheral vascular disease with ulceration osteomyelitis right foot second toe  Plan: Plan: We will plan for right foot second Ray amputation. Anticipate surgery on Wednesday. Patient may require medical power of attorney for signature of consent.  Thank you for the consult and the opportunity to see Ryan Pacheco, [place] [phone] 6:49 AM

## 2017-02-19 NOTE — Anesthesia Procedure Notes (Addendum)
Anesthesia Regional Block: Adductor canal block   Pre-Anesthetic Checklist: ,, timeout performed, Correct Patient, Correct Site, Correct Laterality, Correct Procedure, Correct Position, site marked, Risks and benefits discussed,  Surgical consent,  Pre-op evaluation,  At surgeon's request and post-op pain management  Laterality: Right and Upper  Prep: Dura Prep       Needles:  Injection technique: Single-shot  Needle Type: Echogenic Stimulator Needle     Needle Length: 9cm  Needle Gauge: 21   Needle insertion depth: 5 cm   Additional Needles:   Procedures: ultrasound guided,,,,,,,,  Narrative:  Start time: 02/19/2017 12:47 PM End time: 02/19/2017 12:54 PM Injection made incrementally with aspirations every 5 mL.  Performed by: Personally  Anesthesiologist: Lyn Hollingshead

## 2017-02-20 ENCOUNTER — Encounter (HOSPITAL_COMMUNITY): Payer: Self-pay | Admitting: Orthopedic Surgery

## 2017-02-20 DIAGNOSIS — Z4889 Encounter for other specified surgical aftercare: Secondary | ICD-10-CM | POA: Diagnosis not present

## 2017-02-20 DIAGNOSIS — E1121 Type 2 diabetes mellitus with diabetic nephropathy: Secondary | ICD-10-CM | POA: Diagnosis not present

## 2017-02-20 DIAGNOSIS — M86171 Other acute osteomyelitis, right ankle and foot: Secondary | ICD-10-CM | POA: Diagnosis not present

## 2017-02-20 DIAGNOSIS — I2510 Atherosclerotic heart disease of native coronary artery without angina pectoris: Secondary | ICD-10-CM | POA: Diagnosis not present

## 2017-02-20 DIAGNOSIS — N323 Diverticulum of bladder: Secondary | ICD-10-CM | POA: Diagnosis not present

## 2017-02-20 DIAGNOSIS — M868X7 Other osteomyelitis, ankle and foot: Secondary | ICD-10-CM | POA: Diagnosis not present

## 2017-02-20 DIAGNOSIS — R278 Other lack of coordination: Secondary | ICD-10-CM | POA: Diagnosis not present

## 2017-02-20 DIAGNOSIS — E1122 Type 2 diabetes mellitus with diabetic chronic kidney disease: Secondary | ICD-10-CM | POA: Diagnosis not present

## 2017-02-20 DIAGNOSIS — E785 Hyperlipidemia, unspecified: Secondary | ICD-10-CM | POA: Diagnosis not present

## 2017-02-20 DIAGNOSIS — S98119A Complete traumatic amputation of unspecified great toe, initial encounter: Secondary | ICD-10-CM | POA: Diagnosis not present

## 2017-02-20 DIAGNOSIS — R41841 Cognitive communication deficit: Secondary | ICD-10-CM | POA: Diagnosis not present

## 2017-02-20 DIAGNOSIS — R609 Edema, unspecified: Secondary | ICD-10-CM | POA: Diagnosis not present

## 2017-02-20 DIAGNOSIS — Z89421 Acquired absence of other right toe(s): Secondary | ICD-10-CM | POA: Diagnosis not present

## 2017-02-20 DIAGNOSIS — R296 Repeated falls: Secondary | ICD-10-CM | POA: Diagnosis not present

## 2017-02-20 DIAGNOSIS — M869 Osteomyelitis, unspecified: Secondary | ICD-10-CM | POA: Diagnosis not present

## 2017-02-20 DIAGNOSIS — Z1389 Encounter for screening for other disorder: Secondary | ICD-10-CM | POA: Diagnosis not present

## 2017-02-20 DIAGNOSIS — K219 Gastro-esophageal reflux disease without esophagitis: Secondary | ICD-10-CM | POA: Diagnosis not present

## 2017-02-20 DIAGNOSIS — N401 Enlarged prostate with lower urinary tract symptoms: Secondary | ICD-10-CM | POA: Diagnosis not present

## 2017-02-20 DIAGNOSIS — E7800 Pure hypercholesterolemia, unspecified: Secondary | ICD-10-CM | POA: Diagnosis not present

## 2017-02-20 DIAGNOSIS — M6281 Muscle weakness (generalized): Secondary | ICD-10-CM | POA: Diagnosis not present

## 2017-02-20 DIAGNOSIS — E119 Type 2 diabetes mellitus without complications: Secondary | ICD-10-CM | POA: Diagnosis not present

## 2017-02-20 DIAGNOSIS — I10 Essential (primary) hypertension: Secondary | ICD-10-CM | POA: Diagnosis not present

## 2017-02-20 DIAGNOSIS — N400 Enlarged prostate without lower urinary tract symptoms: Secondary | ICD-10-CM | POA: Diagnosis not present

## 2017-02-20 DIAGNOSIS — N138 Other obstructive and reflux uropathy: Secondary | ICD-10-CM | POA: Diagnosis not present

## 2017-02-20 DIAGNOSIS — R2689 Other abnormalities of gait and mobility: Secondary | ICD-10-CM | POA: Diagnosis not present

## 2017-02-20 DIAGNOSIS — Z0000 Encounter for general adult medical examination without abnormal findings: Secondary | ICD-10-CM | POA: Diagnosis not present

## 2017-02-20 DIAGNOSIS — N183 Chronic kidney disease, stage 3 (moderate): Secondary | ICD-10-CM | POA: Diagnosis not present

## 2017-02-20 LAB — BASIC METABOLIC PANEL
Anion gap: 8 (ref 5–15)
BUN: 19 mg/dL (ref 6–20)
CALCIUM: 8.9 mg/dL (ref 8.9–10.3)
CO2: 25 mmol/L (ref 22–32)
CREATININE: 0.96 mg/dL (ref 0.61–1.24)
Chloride: 107 mmol/L (ref 101–111)
GFR calc non Af Amer: >60 mL/min (ref >60)
Glucose, Bld: 93 mg/dL (ref 65–99)
Potassium: 4 mmol/L (ref 3.5–5.1)
Sodium: 140 mmol/L (ref 135–145)

## 2017-02-20 LAB — GLUCOSE, CAPILLARY
Glucose-Capillary: 98 mg/dL (ref 65–99)
Glucose-Capillary: 130 mg/dL — ABNORMAL HIGH (ref 65–99)

## 2017-02-20 LAB — CBC
HCT: 36.8 % — ABNORMAL LOW (ref 39.0–52.0)
Hemoglobin: 11.8 g/dL — ABNORMAL LOW (ref 13.0–17.0)
MCH: 29.5 pg (ref 26.0–34.0)
MCHC: 32.1 g/dL (ref 30.0–36.0)
MCV: 92 fL (ref 78.0–100.0)
PLATELETS: 217 10*3/uL (ref 150–400)
RBC: 4 MIL/uL — ABNORMAL LOW (ref 4.22–5.81)
RDW: 14 % (ref 11.5–15.5)
WBC: 10 10*3/uL (ref 4.0–10.5)

## 2017-02-20 LAB — MAGNESIUM: MAGNESIUM: 2.1 mg/dL (ref 1.7–2.4)

## 2017-02-20 MED ORDER — ENSURE ENLIVE PO LIQD: 237.0000 mL | Freq: Two times a day (BID) | ORAL | 12 refills | Status: DC

## 2017-02-20 MED ORDER — DOXYCYCLINE HYCLATE 100 MG PO TABS
100.0000 mg | ORAL_TABLET | Freq: Two times a day (BID) | ORAL | Status: DC
  Administered 2017-02-20: 100 mg via ORAL
  Filled 2017-02-20: qty 1

## 2017-02-20 MED ORDER — POLYETHYLENE GLYCOL 3350 17 G PO PACK: 17.0000 g | PACK | Freq: Every day | ORAL | 0 refills | Status: DC | PRN

## 2017-02-20 MED ORDER — SACCHAROMYCES BOULARDII 250 MG PO CAPS: 250.0000 mg | ORAL_CAPSULE | Freq: Two times a day (BID) | ORAL | 0 refills | Status: AC

## 2017-02-20 MED ORDER — SENNOSIDES-DOCUSATE SODIUM 8.6-50 MG PO TABS: 1.0000 | ORAL_TABLET | Freq: Every day | ORAL | 0 refills | Status: AC

## 2017-02-20 MED ORDER — PRO-STAT SUGAR FREE PO LIQD: 30.0000 mL | Freq: Two times a day (BID) | ORAL | 0 refills | Status: DC

## 2017-02-20 MED ORDER — CLINDAMYCIN HCL 300 MG PO CAPS: 300.0000 mg | ORAL_CAPSULE | Freq: Three times a day (TID) | ORAL | Status: DC

## 2017-02-20 MED ORDER — TRAMADOL HCL 50 MG PO TABS: 50.0000 mg | ORAL_TABLET | Freq: Four times a day (QID) | ORAL | 0 refills | Status: DC | PRN

## 2017-02-20 MED ORDER — SACCHAROMYCES BOULARDII 250 MG PO CAPS
250.0000 mg | ORAL_CAPSULE | Freq: Two times a day (BID) | ORAL | Status: DC
  Administered 2017-02-20: 250 mg via ORAL
  Filled 2017-02-20: qty 1

## 2017-02-20 MED ORDER — DOXYCYCLINE HYCLATE 100 MG PO TABS: 100.0000 mg | ORAL_TABLET | Freq: Two times a day (BID) | ORAL | 0 refills | Status: AC

## 2017-02-20 NOTE — Clinical Social Work Placement (Signed)
   CLINICAL SOCIAL WORK PLACEMENT  NOTE  Date:  02/20/2017  Patient Details  Name: Ryan Pacheco MRN: [id number] Date of Birth: 1919-11-17  Clinical Social Work is seeking post-discharge placement for this patient at the [place] level of care (*CSW will initial, date and re-position this form in  chart as items are completed):  Yes   Patient/family provided with [place] Work Department's list of facilities offering this level of care within the geographic area requested by the patient (or if unable, by the patient's family).  Yes   Patient/family informed of their freedom to choose among providers that offer the needed level of care, that participate in Medicare, Medicaid or managed care program needed by the patient, have an available bed and are willing to accept the patient.  Yes   Patient/family informed of [facility]'s ownership interest in [facility] and [facility], as well as of the fact that they are under no obligation to receive care at these facilities.  PASRR submitted to EDS on       PASRR number received on 02/16/17     Existing PASRR number confirmed on 02/16/17     FL2 transmitted to all facilities in geographic area requested by pt/family on 02/16/17     FL2 transmitted to all facilities within larger geographic area on 02/16/17     Patient informed that his/her managed care company has contracts with or will negotiate with certain facilities, including the following:        Yes   Patient/family informed of bed offers received.  Patient chooses bed at [facility]     Physician recommends and patient chooses bed at      Patient to be transferred to [facility] on 02/20/17.  Patient to be transferred to facility by ptar     Patient family notified on 02/20/17 of transfer.  Name of family member notified:  daughter, [name]     PHYSICIAN Please prepare priority discharge summary,  including medications, Please sign DNR, Please prepare prescriptions, Please sign FL2     Additional Comment:    _______________________________________________ Normajean Baxter, LCSW 02/20/2017, 1:15 PM

## 2017-02-20 NOTE — Progress Notes (Signed)
Orthopedic Tech Progress Note Patient Details:  Ryan Pacheco 11-Aug-1920 [id number]  Ortho Devices Type of Ortho Device: Postop shoe/boot Ortho Device/Splint Interventions: Application   [name] 02/20/2017, 2:58 PM

## 2017-02-20 NOTE — Progress Notes (Signed)
Physical Therapy RE-EVAL Patient Details Name: Ryan Pacheco MRN: [id number] DOB: 1920-01-30 Today's Date: 02/20/2017    History of Present Illness Patient is a 83 yo male admitted 02/14/17 after hitting Rt foot on w/c, cutting Rt 2nd toe.   Patient with osteomyelitis Rt 2nd toe with cellulitis Rt foot/leg, and fractured 4th and 5th toes.  Pt s/p R second toe amputation on 02/19/17 and is TDWB post op in a post op shoe.    PMH:  HTN, DM, stage 3 CKD, and BPH (s/p suprapubic catheter), HOH, MI, bradycardia, MI, arthritis    PT Comments    Pt is now post op amputation and is TDWB.  He was able to stand and pivot to the recliner chair with two person assist and the standing frame.  Goals checked and remain appropriate. D/c recommendations remain appropriate as well.   PT to follow acutely for deficits listed below.     Follow Up Recommendations  SNF     Equipment Recommendations  Wheelchair (measurements PT);Wheelchair cushion (measurements PT);Hospital bed    Recommendations for Other Services   NA    Precautions / Restrictions Precautions Precautions: Fall Precaution Comments: Falls pta Required Braces or Orthoses: Other Brace/Splint Other Brace/Splint: post op shoe on right foot Restrictions Weight Bearing Restrictions: Yes RLE Weight Bearing: Touchdown weight bearing    Mobility  Bed Mobility Overal bed mobility: Needs Assistance Bed Mobility: Supine to Sit   Sidelying to sit: HOB elevated;+2 for physical assistance;Mod assist       General bed mobility comments: Two person mod assist to get to sitting EOB.  HOB elevated >45 degrees and pt trying to use rail to pull up.    Transfers Overall transfer level: Needs assistance Equipment used: 2 person hand held assist Transfers: Sit to/from [facility] Sit to Stand: +2 safety/equipment;Mod assist;From elevated surface Stand pivot transfers: From elevated surface;+2 safety/equipment;Mod assist        General transfer comment: Two person mod assist to stand from elelvated bed and lower recliner chair.  Used the steady standing frame to transfer OOB to chair wiht mod assist to stay standing during transition.           Balance Overall balance assessment: Needs assistance Sitting-balance support: Feet supported;Bilateral upper extremity supported Sitting balance-Leahy Scale: Fair Sitting balance - Comments: supervision EOB   Standing balance support: Bilateral upper extremity supported Standing balance-Leahy Scale: Poor                              Cognition Arousal/Alertness: Lethargic Behavior During Therapy: WFL for tasks assessed/performed Overall Cognitive Status: No family/caregiver present to determine baseline cognitive functioning                                        Exercises General Exercises - Upper Extremity Shoulder Flexion: AAROM;Both;10 reps Elbow Flexion: AAROM;Both;10 reps General Exercises - Lower Extremity Ankle Circles/Pumps: AAROM;Both;20 reps Heel Slides: AAROM;Both;10 reps Straight Leg Raises: AAROM;Both;10 reps        Pertinent Vitals/Pain Pain Assessment: Faces Faces Pain Scale: Hurts even more Pain Location: right foot with standing  Pain Descriptors / Indicators: Grimacing;Guarding Pain Intervention(s): Limited activity within patient's tolerance;Monitored during session;Repositioned           PT Goals (current goals can now be found in the care plan section) Acute Rehab  PT Goals Patient Stated Goal: none stated PT Goal Formulation: With patient Time For Goal Achievement: 03/06/17 Potential to Achieve Goals: Fair Progress towards PT goals:  (re-eval complete post op)    Frequency    Min 3X/week      PT Plan Current plan remains appropriate       End of Session Equipment Utilized During Treatment: Gait belt Activity Tolerance: Patient limited by fatigue Patient left: in chair;with call  bell/phone within reach;with chair alarm set Nurse Communication: Mobility status (to RN tech use the steady) PT Visit Diagnosis: Unsteadiness on feet (R26.81);Muscle weakness (generalized) (M62.81);Difficulty in walking, not elsewhere classified (R26.2);Pain Pain - Right/Left: Right Pain - part of body: Ankle and joints of foot     Time: 4935-5217 PT Time Calculation (min) (ACUTE ONLY): 26 min  Charges:  $Therapeutic Activity: 8-22 mins          Sheehan Stacey B. [place], [place], DPT [phone]            02/20/2017, 4:12 PM

## 2017-02-20 NOTE — Progress Notes (Signed)
Suprapubic

## 2017-02-20 NOTE — Social Work (Signed)
Clinical Social Worker facilitated patient discharge including contacting patient family and facility to confirm patient discharge plans.  Clinical information faxed to facility and family agreeable with plan.  CSW arranged ambulance transport via [facility] to [facility].  RN to call [phone] report prior to discharge.  Clinical Social Worker will sign off for now as social work intervention is no longer needed. Please consult [place] again if new need arises.  Elissa Hefty, LCSW Clinical Social Worker [phone]

## 2017-02-20 NOTE — Progress Notes (Signed)
Called report to nurse Ty at [facility]. Reviewed HPI, [facility], recent vitals and testing results, and most recent bowel movement. Discharge instructions printed and reviewed with patient and family, and copy given for them to take home. All questions addressed at this time. New prescriptions sent with patient to [facility]. IV removed, and LLE bandage changed. Room searched for patient belongings, and confirmed with patient that all valuables were accounted for. Awaiting [facility] to arrive to transport patient to [facility].

## 2017-02-20 NOTE — Discharge Summary (Signed)
[facility] Discharge Summary   Patient: Ryan Pacheco QQI:[id number]   PCP: Ryan Austria, MD DOB: [birth year]   Date of admission: 02/14/2017   Date of discharge:  02/20/2017    Discharge Diagnoses:  Principal Problem:   Acute osteomyelitis of toe of right foot ([place]) Active Problems:   Mixed hyperlipidemia   BPH (benign prostatic hyperplasia)   HTN (hypertension)   Type 2 diabetes with nephropathy ([place])   CKD (chronic kidney disease), stage III   Admitted From: SNF Disposition:  SNF  Recommendations for Outpatient Follow-up:  1. Please follow up with PCP in 1 week and Dr Sharol Given in  1 week   Contact information for follow-up providers    Ryan Minion, MD Follow up in 1 week(s).   Specialty:  Orthopedic Surgery Contact information: [place] [address] [phone]        Ryan Austria, MD. Schedule an appointment as soon as possible for a visit in 1 week(s).   Specialty:  Family Medicine Contact information: [place] [address] [phone]            Contact information for after-discharge care    Destination    [facility] SN[facility] Follow up.   Specialty:  [place] information: [place] [phone]                 Diet recommendation: cardiac diet  Activity: The patient is advised to gradually reintroduce usual activities.  Discharge Condition: good  Code Status: DNR DNI  History of present illness: As per the H and P dictated on admission, "Ryan Pacheco is a 83 y/o male with PMH significant for HTN, DM, stage 3 CKD, and BPH (s/p suprapubic catheter); who presented to ED secondary to no healing wound on his right foot. Patient endorses experiencing traumatic injury with his wheelchair couple weeks ago on his right foot. No attention never pursuit. With time wound has worsen, no with  erythema, swelling, drainage and essentially complete open erosion. Patient is experiencing intermittent throbbing pain and discomfort. No fever, chills, CP, SOB, nausea, vomiting, dysuria or any other complaints. Due to patient's daughter insistence he came to ED to be evaluated. Found to have cellulitis and as mentioned below images done also suggesting osteomyelitis."  Hospital Course:  Summary of his active problems in the hospital is as following. 1. Acute osteomyelitis of toe of right foot ([place]) Traumatic right foot injury, multiple toe fractures. No evidence of sepsis but on IV antibiotics at present. Orthopedic consulted recommend first ray amputation. Underwent 02/19/2017. Patient is a moderate to high risk for surgery due to his age as well as comorbidities. Started on IV antibiotics empirically, change to oral doxycycline and finish 7 days course. One out of 2 cultures positive for coagulase-negative Staphylococcus, likely contamination.  2. Chronic suprapubic Foley catheter. History of BPH.  will monitor at present.  3. Essential hypertension. Blood pressure stable, monitor  4. Hyperglycemia. No DM Patient was diagnosed with diabetes mellitus although his A1c has never been more than 6.5. This admission his A1c is also 5.4. Given active infection given sensitive sliding scale no indication to continue diabetes treatment as an outpatient.  5. GERD. Continue PPI.  6. Lethargy. resovled Likely in the setting of active infection.  7. Constipation. Last BM 02/15/2017. Starting on MiraLAX and Senokot.  All other chronic medical condition were stable during the hospitalization.  Patient was  seen by physical therapy, who recommended SNF, which was arranged by Education officer, museum and case Freight forwarder. On the day of the discharge the patient's vitals were stable, and no other acute medical condition were reported by patient. the patient was felt safe to be discharge at SNF with  therapy.  Procedures and Results:   2nd toeToe Amputation Right Foot 02/19/2017   Consultations:  Orthopedics  DISCHARGE MEDICATION: Current Discharge Medication List    START taking these medications   Details  Amino Acids-Protein Hydrolys (FEEDING SUPPLEMENT, PRO-STAT SUGAR FREE 64,) LIQD Take 30 mLs by mouth 2 (two) times daily. Qty: 900 mL, Refills: 0    doxycycline (VIBRA-TABS) 100 MG tablet Take 1 tablet (100 mg total) by mouth every 12 (twelve) hours. Qty: 4 tablet, Refills: 0    feeding supplement, ENSURE ENLIVE, (ENSURE ENLIVE) LIQD Take 237 mLs by mouth 2 (two) times daily between meals. Qty: 237 mL, Refills: 12    polyethylene glycol (MIRALAX / GLYCOLAX) packet Take 17 g by mouth daily as needed for mild constipation. Qty: 14 each, Refills: 0    saccharomyces boulardii (FLORASTOR) 250 MG capsule Take 1 capsule (250 mg total) by mouth 2 (two) times daily. Qty: 18 capsule, Refills: 0    senna-docusate (SENOKOT-S) 8.6-50 MG tablet Take 1 tablet by mouth at bedtime. Qty: 7 tablet, Refills: 0    traMADol (ULTRAM) 50 MG tablet Take 1 tablet (50 mg total) by mouth every 6 (six) hours as needed. Qty: 16 tablet, Refills: 0      CONTINUE these medications which have NOT CHANGED   Details  aspirin EC 81 MG tablet Take 81 mg by mouth daily.    atorvastatin (LIPITOR) 10 MG tablet Take 5 mg by mouth daily at 8 pm.     docusate sodium (COLACE) 100 MG capsule Take 100 mg by mouth 2 (two) times daily.    guaiFENesin (MUCINEX) 600 MG 12 hr tablet Take 600 mg by mouth 2 (two) times daily.    methylcellulose (ARTIFICIAL TEARS) 1 % ophthalmic solution Place 2 drops into both eyes 4 (four) times daily.    Multiple Vitamin (MULITIVITAMIN WITH MINERALS) TABS Take 1 tablet by mouth daily.    nystatin (NYSTATIN) powder Apply 1 g topically 2 (two) times daily.    pantoprazole (PROTONIX) 40 MG tablet Take 40 mg by mouth daily.     potassium chloride SA (K-DUR,KLOR-CON) 20 MEQ  tablet Take 20 mEq by mouth daily.     Skin Protectants, Misc. (DIMETHICONE-ZINC OXIDE) cream Apply 1 application topically 2 (two) times daily. BAZA PROTECT CREAM-apply skin prep to reddened area; apply to protect from moisture.    acetaminophen (TYLENOL) 325 MG tablet Take 650 mg by mouth every 6 (six) hours as needed for mild pain.    dextromethorphan (DELSYM) 30 MG/5ML liquid Take 60 mg by mouth 2 (two) times daily as needed for cough.       Allergies  Allergen Reactions  . No Known Allergies    Discharge Instructions    Diet - low sodium heart healthy    Complete by:  As directed    Discharge instructions    Complete by:  As directed    It is important that you read following instructions as well as go over your medication list with RN to help you understand your care after this hospitalization.  Discharge Instructions: lease follow-up with PCP in one week  Please request your primary care physician to go over all Hospital Tests and Procedure/Radiological  results at the follow up,  Please get all Hospital records sent to your PCP by signing hospital release before you go home.   Do not drive, operating heavy machinery, perform activities at heights, swimming or participation in water activities or provide baby sitting services; until you have been seen by Primary Care Physician or a Neurologist and advised to do so again. Do not take more than prescribed Pain, Sleep and Anxiety Medications. You were cared for by a hospitalist during your hospital stay. If you have any questions about your discharge medications or the care you received while you were in the hospital after you are discharged, you can call the unit and ask to speak with the hospitalist on call if the hospitalist that took care of you is not available.  Once you are discharged, your primary care physician will handle any further medical issues. Please note that NO REFILLS for any discharge medications will be  authorized once you are discharged, as it is imperative that you return to your primary care physician (or establish a relationship with a primary care physician if you do not have one) for your aftercare needs so that they can reassess your need for medications and monitor your lab values. You Must read complete instructions/literature along with all the possible adverse reactions/side effects for all the Medicines you take and that have been prescribed to you. Take any new Medicines after you have completely understood and accept all the possible adverse reactions/side effects. Wear Seat belts while driving.   Increase activity slowly    Complete by:  As directed    Touch down weight bearing    Complete by:  As directed    Laterality:  right   Extremity:  Lower     Discharge Exam: Filed Weights   02/15/17 1027  Weight: 72.6 kg (160 lb 0.9 oz)   Vitals:   02/20/17 0410 02/20/17 0809  BP: (!) 154/61 (!) 144/74  Pulse: 63 70  Resp: 16 16  Temp: 97.5 F (36.4 C) 97.7 F (36.5 C)   General: Appear in no distress, no Rash; Oral Mucosa moist. Cardiovascular: S1 and S2 Present, no Murmur, no JVD Respiratory: Bilateral Air entry present and Clear to Auscultation, no Crackles, no wheezes Abdomen: Bowel Sound present, Soft and no tenderness Extremities: no Pedal edema, no calf tenderness, right leg wrapped Neurology: Grossly no focal neuro deficit.  The results of significant diagnostics from this hospitalization (including imaging, microbiology, ancillary and laboratory) are listed below for reference.    Significant Diagnostic Studies: Dg Tibia/fibula Left  Result Date: 02/14/2017 CLINICAL DATA:  Left lower extremity wound. EXAM: LEFT TIBIA AND FIBULA - 2 VIEW COMPARISON:  None. FINDINGS: The knee and ankle joints are maintained. No acute bony findings or destructive bony changes. Small soft tissue wound noted involving the midshaft region on the leg anteriorly. Vascular calcifications  are noted. IMPRESSION: No acute bony findings. Electronically Signed   By: Marijo Sanes M.D.   On: 02/14/2017 16:50   Dg Foot Complete Right  Result Date: 02/14/2017 CLINICAL DATA:  Open wounds involving the first and second toes. EXAM: RIGHT FOOT COMPLETE - 3+ VIEW COMPARISON:  None. FINDINGS: Destructive bony changes involving the second toe with findings consistent with septic arthritis at the PIP joint and osteomyelitis involving the adjacent proximal and middle phalanges. Degenerative changes at the first metatarsal phalangeal joint. No definite findings for osteomyelitis. Could not exclude fractures of the fourth and fifth proximal phalanges. The metatarsals are intact.  IMPRESSION: Plain film findings consistent with septic arthritis involving the PIP joint of the second toe with adjacent osteomyelitis involving the proximal and middle phalanges. Possible fractures of the fourth and fifth proximal phalanges. Electronically Signed   By: Marijo Sanes M.D.   On: 02/14/2017 16:48    Microbiology: Recent Results (from the past 240 hour(s))  Blood culture (routine x 2)     Status: Abnormal   Collection Time: 02/14/17  5:05 PM  Result Value Ref Range Status   Specimen Description BLOOD RIGHT ANTECUBITAL  Final   Special Requests   Final    BOTTLES DRAWN AEROBIC AND ANAEROBIC Blood Culture adequate volume   Culture  Setup Time   Final    GRAM POSITIVE COCCI IN CLUSTERS CRITICAL RESULT CALLED TO, READ BACK BY AND VERIFIED WITH: TO VBRYK(PHARMd) BY TCLEVELAND 02/16/2017 AT 12:59AM IN BOTH AEROBIC AND ANAEROBIC BOTTLES    Culture (A)  Final    STAPHYLOCOCCUS SPECIES (COAGULASE NEGATIVE) THE SIGNIFICANCE OF ISOLATING THIS ORGANISM FROM A SINGLE SET OF BLOOD CULTURES WHEN MULTIPLE SETS ARE DRAWN IS UNCERTAIN. PLEASE NOTIFY THE MICROBIOLOGY DEPARTMENT WITHIN ONE WEEK IF SPECIATION AND SENSITIVITIES ARE REQUIRED. Performed at [facility], [place] [address] [place], [place] [address]    Report  Status 02/18/2017 FINAL  Final  Blood Culture ID Panel (Reflexed)     Status: Abnormal   Collection Time: 02/14/17  5:05 PM  Result Value Ref Range Status   Enterococcus species NOT DETECTED NOT DETECTED Final   Listeria monocytogenes NOT DETECTED NOT DETECTED Final   Staphylococcus species DETECTED (A) NOT DETECTED Final    Comment: Methicillin (oxacillin) resistant coagulase negative staphylococcus. Possible blood culture contaminant (unless isolated from more than one blood culture draw or clinical case suggests pathogenicity). No antibiotic treatment is indicated for blood  culture contaminants. CRITICAL RESULT CALLED TO, READ BACK BY AND VERIFIED WITH: TO VBRYK(PHARMd) BY TCLEVELAND 02/16/2017 AT 12:59AM    Staphylococcus aureus NOT DETECTED NOT DETECTED Final   Methicillin resistance DETECTED (A) NOT DETECTED Final    Comment: CRITICAL RESULT CALLED TO, READ BACK BY AND VERIFIED WITH: TO VBRYK(PHARMd) BY TCLEVELAND 02/16/2017 AT 12:59AM    Streptococcus species NOT DETECTED NOT DETECTED Final   Streptococcus agalactiae NOT DETECTED NOT DETECTED Final   Streptococcus pneumoniae NOT DETECTED NOT DETECTED Final   Streptococcus pyogenes NOT DETECTED NOT DETECTED Final   Acinetobacter baumannii NOT DETECTED NOT DETECTED Final   Enterobacteriaceae species NOT DETECTED NOT DETECTED Final   Enterobacter cloacae complex NOT DETECTED NOT DETECTED Final   Escherichia coli NOT DETECTED NOT DETECTED Final   Klebsiella oxytoca NOT DETECTED NOT DETECTED Final   Klebsiella pneumoniae NOT DETECTED NOT DETECTED Final   Proteus species NOT DETECTED NOT DETECTED Final   Serratia marcescens NOT DETECTED NOT DETECTED Final   Haemophilus influenzae NOT DETECTED NOT DETECTED Final   Neisseria meningitidis NOT DETECTED NOT DETECTED Final   Pseudomonas aeruginosa NOT DETECTED NOT DETECTED Final   Candida albicans NOT DETECTED NOT DETECTED Final   Candida glabrata NOT DETECTED NOT DETECTED Final   Candida  krusei NOT DETECTED NOT DETECTED Final   Candida parapsilosis NOT DETECTED NOT DETECTED Final   Candida tropicalis NOT DETECTED NOT DETECTED Final    Comment: Performed at [facility], [place]. [address], [place], [place] [address]  Blood culture (routine x 2)     Status: None   Collection Time: 02/14/17  5:10 PM  Result Value Ref Range Status  Specimen Description LEFT ANTECUBITAL  Final   Special Requests IN PEDIATRIC BOTTLE Blood Culture adequate volume  Final   Culture   Final    NO GROWTH 5 DAYS Performed at [facility], [place] [address] [place], [place] [address]    Report Status 02/19/2017 FINAL  Final  MRSA PCR Screening     Status: None   Collection Time: 02/16/17  1:00 AM  Result Value Ref Range Status   MRSA by PCR NEGATIVE NEGATIVE Final    Comment:        The GeneXpert MRSA Assay (FDA approved for NASAL specimens only), is one component of a comprehensive MRSA colonization surveillance program. It is not intended to diagnose MRSA infection nor to guide or monitor treatment for MRSA infections.      Labs: CBC:  Recent Labs Lab 02/14/17 1606 02/15/17 0010 02/17/17 0303 02/19/17 0808 02/20/17 0617  WBC 9.9 9.5 7.7 8.0 10.0  NEUTROABS 6.9  --   --   --   --   HGB 12.2* 12.0* 11.9* 12.2* 11.8*  HCT 37.7* 36.8* 36.9* 37.6* 36.8*  MCV 91.1 92.5 92.3 93.1 92.0  PLT 235 234 224 220 903   Basic Metabolic Panel:  Recent Labs Lab 02/14/17 1606 02/15/17 0010 02/17/17 0303 02/19/17 0808 02/20/17 0617  NA 141 145 142 141 140  K 3.9 4.3 4.6 4.1 4.0  CL 111 109 107 110 107  CO2 26 24 28 24 25   GLUCOSE 124* 103* 100* 91 93  BUN 19 16 19 19 19   CREATININE 1.05 1.00 1.04 1.07 0.96  CALCIUM 8.6* 8.9 8.9 8.8* 8.9  MG  --  2.0  --   --  2.1  PHOS  --  3.5  --   --   --    Liver Function Tests:  Recent Labs Lab 02/15/17 0010  AST 18  ALT 19  ALKPHOS 64  BILITOT 0.7  PROT 6.0*  ALBUMIN 3.0*   No results for input(s): LIPASE, AMYLASE in the  last 168 hours. No results for input(s): AMMONIA in the last 168 hours. Cardiac Enzymes:  Recent Labs Lab 02/14/17 2059  TROPONINI <0.03   BNP (last 3 results)  Recent Labs  05/19/16 1037  BNP 363.2*   CBG:  Recent Labs Lab 02/19/17 1607 02/19/17 1702 02/19/17 2147 02/20/17 0620 02/20/17 1113  GLUCAP 96 89 133* 98 130*   Time spent: 35 minutes  Signed:  Mariela Rex  [facility]  02/20/2017  , 12:46 PM

## 2017-02-20 NOTE — Progress Notes (Signed)
Patient ID: Ryan Pacheco, male   DOB: 1920/05/08, 83 y.o.   MRN: [id number] Postoperative day 1 amputation second toe right foot. Patient is resting comfortably. Dressing is clean and dry. Patient is safe for discharge from an orthopedic standpoint. Minimize weightbearing right lower extremity.

## 2017-02-21 DIAGNOSIS — M868X7 Other osteomyelitis, ankle and foot: Secondary | ICD-10-CM | POA: Diagnosis not present

## 2017-02-21 DIAGNOSIS — N138 Other obstructive and reflux uropathy: Secondary | ICD-10-CM | POA: Diagnosis not present

## 2017-02-21 DIAGNOSIS — N401 Enlarged prostate with lower urinary tract symptoms: Secondary | ICD-10-CM | POA: Diagnosis not present

## 2017-02-21 DIAGNOSIS — I10 Essential (primary) hypertension: Secondary | ICD-10-CM | POA: Diagnosis not present

## 2017-02-21 DIAGNOSIS — K219 Gastro-esophageal reflux disease without esophagitis: Secondary | ICD-10-CM | POA: Diagnosis not present

## 2017-02-25 ENCOUNTER — Inpatient Hospital Stay (INDEPENDENT_AMBULATORY_CARE_PROVIDER_SITE_OTHER): Payer: Medicare Other | Admitting: Orthopedic Surgery

## 2017-02-27 ENCOUNTER — Ambulatory Visit (INDEPENDENT_AMBULATORY_CARE_PROVIDER_SITE_OTHER): Payer: Medicare Other | Admitting: Orthopedic Surgery

## 2017-02-27 ENCOUNTER — Encounter (INDEPENDENT_AMBULATORY_CARE_PROVIDER_SITE_OTHER): Payer: Self-pay | Admitting: Orthopedic Surgery

## 2017-02-27 VITALS — Ht 67.0 in | Wt 160.0 lb

## 2017-02-27 DIAGNOSIS — Z89421 Acquired absence of other right toe(s): Secondary | ICD-10-CM

## 2017-02-27 DIAGNOSIS — S98131A Complete traumatic amputation of one right lesser toe, initial encounter: Secondary | ICD-10-CM | POA: Insufficient documentation

## 2017-02-27 NOTE — Progress Notes (Signed)
Office Visit Note   Patient: Ryan Pacheco           Date of Birth: 1920/06/10           MRN: [id number] Visit Date: 02/27/2017              Requested by: Maury Dus, MD [place], [place] [id number] PCP: [name], MD  Chief Complaint  Patient presents with  . Right Foot - Routine Post Op    02/19/17 right second toe amputation      HPI: Patient presents just over a week out from right foot second toe amputation he has been nonweightbearing in a wheelchair. Patient denies any pain in the foot feels well he is currently skilled nursing [facility]  Assessment & Plan: Visit Diagnoses:  1. Amputated toe of right foot ([place])     Plan: We will have the skilled nursing facility remove the sutures in 1 week. He will begin physical therapy weightbearing as tolerated on the right wash the foot with soap and water daily apply moisturizing lotion wear regular socks. I will follow-up as needed.  Follow-Up Instructions: Return if symptoms worsen or fail to improve.   Ortho Exam  Patient is alert, oriented, no adenopathy, well-dressed, normal affect, normal respiratory effort. Examination incision is well-healed he has good pulses there is no cellulitis no drainage no signs of infection.  Imaging: No results found.  Labs: Lab Results  Component Value Date   HGBA1C 5.4 02/15/2017   HGBA1C 5.9 (H) 08/01/2016   HGBA1C 5.6 05/19/2016   ESRSEDRATE 27 (H) 02/14/2017   CRP 4.3 (H) 02/14/2017   REPTSTATUS 02/19/2017 FINAL 02/14/2017   CULT  02/14/2017    NO GROWTH 5 DAYS Performed at [facility], [place] [address], [place], [place] [address]     Orders:  No orders of the defined types were placed in this encounter.  No orders of the defined types were placed in this encounter.    Procedures: No procedures performed  Clinical Data: No additional findings.  ROS:  All other systems negative, except as noted in the HPI. Review of  Systems  Objective: Vital Signs: Ht 5\' 7"  (1.702 m)   Wt 160 lb (72.6 kg)   BMI 25.06 kg/m   Specialty Comments:  No specialty comments available.  PMFS History: Patient Active Problem List   Diagnosis Date Noted  . Amputated toe of right foot ([place]) 02/27/2017  . Acute osteomyelitis of toe of right foot ([place]) 02/14/2017  . BPH (benign prostatic hyperplasia) 02/14/2017  . HTN (hypertension) 02/14/2017  . Type 2 diabetes with nephropathy ([place]) 02/14/2017  . CKD (chronic kidney disease), stage III 02/14/2017  . Acute renal failure ([place]) 05/19/2016  . Bladder outlet obstruction 05/19/2016  . Hyperkalemia 05/19/2016  . Frequent falls 05/19/2016  . Mixed hyperlipidemia 11/10/2013  . Old myocardial infarction 11/10/2013  . Edema 11/10/2013  . Postsurgical aortocoronary bypass status 02/06/2012  . Coronary atherosclerosis 02/06/2012   Past Medical History:  Diagnosis Date  . Acid reflux   . Angina   . Arthritis 01-09-12   hands, foot  . Benign prostatic hypertrophy   . Bradycardia   . Chronic kidney disease    Stage 1  through Stage 4 of unspecified chronic kidney disease.  . Diabetes mellitus   . Edema   . Embolism - blood clot ~ 2000   "behind left knee"  . Foley catheter in place    at  present to leg bag  . Hearing impaired person, bilateral    right ear hearing aid-"loss other"  . History of asbestos exposure    "have some lung disease from years of exposure"  . Impaired gait and mobility    ambulates with walker- prone to frequent falls  . Myocardial infarction [facility]) 11/17/10    NSTEMI- Dr. Irish Lack follows  . Prostate hypertrophy 01-09-12   urinary retention-Foley catheter at present; surgery planned    Family History  Problem Relation Age of Onset  . Heart attack Father   . Hypertension Neg Hx   . Stroke Neg Hx     Past Surgical History:  Procedure Laterality Date  . AMPUTATION Right 02/19/2017   Procedure: 2nd Ray Amputation Right Foot;  Surgeon: Newt Minion, MD;  Location: [place];  Service: Orthopedics;  Laterality: Right;  . BLEPHAROPLASTY     left eye  . CARDIAC CATHETERIZATION  01-09-12   1'13  . CATARACT EXTRACTION, BILATERAL  ~ 2010  . CHOLECYSTECTOMY  1980's  . CORONARY ARTERY BYPASS GRAFT  11/26/2011   Procedure:x4- CORONARY ARTERY BYPASS GRAFTING (CABG);  Surgeon: Gaye Pollack, MD;  Location: [place];  Service: Open Heart Surgery;  Laterality: N/A;  . INSERTION OF SUPRAPUBIC CATHETER N/A 08/05/2016   Procedure: CYSTOSCOPY AND INSERTION OF SUPRAPUBIC CATHETER;  Surgeon: Carolan Clines, MD;  Location: [facility];  Service: Urology;  Laterality: N/A;  . LEFT HEART CATHETERIZATION WITH CORONARY ANGIOGRAM N/A 11/20/2011   Procedure: LEFT HEART CATHETERIZATION WITH CORONARY ANGIOGRAM;  Surgeon: Jettie Booze, MD;  Location: [facility] CATH LAB;  Service: Cardiovascular;  Laterality: N/A;  possible PCI  . Suprapubic tube     3'13, then removed.   Social History   Occupational History  . Not on file.   Social History Main Topics  . Smoking status: Former Smoker    Types: Cigars  . Smokeless tobacco: Former Systems developer    Types: Chew     Comment: "hadn't used chew or smoked cigars since 1990's  . Alcohol use No  . Drug use: No  . Sexual activity: No

## 2017-02-28 DIAGNOSIS — E1122 Type 2 diabetes mellitus with diabetic chronic kidney disease: Secondary | ICD-10-CM | POA: Diagnosis not present

## 2017-02-28 DIAGNOSIS — M868X7 Other osteomyelitis, ankle and foot: Secondary | ICD-10-CM | POA: Diagnosis not present

## 2017-02-28 DIAGNOSIS — N138 Other obstructive and reflux uropathy: Secondary | ICD-10-CM | POA: Diagnosis not present

## 2017-02-28 DIAGNOSIS — N401 Enlarged prostate with lower urinary tract symptoms: Secondary | ICD-10-CM | POA: Diagnosis not present

## 2017-02-28 DIAGNOSIS — I10 Essential (primary) hypertension: Secondary | ICD-10-CM | POA: Diagnosis not present

## 2017-03-04 DIAGNOSIS — I2510 Atherosclerotic heart disease of native coronary artery without angina pectoris: Secondary | ICD-10-CM | POA: Diagnosis not present

## 2017-03-04 DIAGNOSIS — R609 Edema, unspecified: Secondary | ICD-10-CM | POA: Diagnosis not present

## 2017-03-04 DIAGNOSIS — Z1389 Encounter for screening for other disorder: Secondary | ICD-10-CM | POA: Diagnosis not present

## 2017-03-04 DIAGNOSIS — R296 Repeated falls: Secondary | ICD-10-CM | POA: Diagnosis not present

## 2017-03-04 DIAGNOSIS — K219 Gastro-esophageal reflux disease without esophagitis: Secondary | ICD-10-CM | POA: Diagnosis not present

## 2017-03-04 DIAGNOSIS — E7800 Pure hypercholesterolemia, unspecified: Secondary | ICD-10-CM | POA: Diagnosis not present

## 2017-03-04 DIAGNOSIS — Z0000 Encounter for general adult medical examination without abnormal findings: Secondary | ICD-10-CM | POA: Diagnosis not present

## 2017-03-04 DIAGNOSIS — N400 Enlarged prostate without lower urinary tract symptoms: Secondary | ICD-10-CM | POA: Diagnosis not present

## 2017-03-04 DIAGNOSIS — Z89421 Acquired absence of other right toe(s): Secondary | ICD-10-CM | POA: Diagnosis not present

## 2017-03-05 ENCOUNTER — Other Ambulatory Visit: Payer: Self-pay | Admitting: *Deleted

## 2017-03-05 DIAGNOSIS — M869 Osteomyelitis, unspecified: Secondary | ICD-10-CM | POA: Diagnosis not present

## 2017-03-05 DIAGNOSIS — E119 Type 2 diabetes mellitus without complications: Secondary | ICD-10-CM | POA: Diagnosis not present

## 2017-03-05 DIAGNOSIS — I10 Essential (primary) hypertension: Secondary | ICD-10-CM | POA: Diagnosis not present

## 2017-03-05 DIAGNOSIS — N183 Chronic kidney disease, stage 3 (moderate): Secondary | ICD-10-CM | POA: Diagnosis not present

## 2017-03-05 NOTE — Patient Outreach (Signed)
[facility]) Care Management  03/05/2017  Ryan Pacheco Mar 05, 1920 [id number]   Met with patient at facility. Patient eating lunch. He reports he is doing well, thinks he will go home next week.  RNCM briefly explained [facility] care management, patient requests brochure to review with family.   Met briefly with Ryan Pacheco, SW at facility, he states he is unsure of discharge plan but thinks it will be next week. He states [name], SW will have more information, she was not available during [facility] visit to facility.   Plan to follow up regarding patient discharge needs next week. [name]. [name], RN, BSN, [place] [phone]) Business Cell  [phone]) Toll Free Office

## 2017-03-10 DIAGNOSIS — E1122 Type 2 diabetes mellitus with diabetic chronic kidney disease: Secondary | ICD-10-CM | POA: Diagnosis not present

## 2017-03-10 DIAGNOSIS — M868X7 Other osteomyelitis, ankle and foot: Secondary | ICD-10-CM | POA: Diagnosis not present

## 2017-03-10 DIAGNOSIS — K219 Gastro-esophageal reflux disease without esophagitis: Secondary | ICD-10-CM | POA: Diagnosis not present

## 2017-03-10 DIAGNOSIS — I10 Essential (primary) hypertension: Secondary | ICD-10-CM | POA: Diagnosis not present

## 2017-03-12 ENCOUNTER — Other Ambulatory Visit: Payer: Self-pay | Admitting: *Deleted

## 2017-03-12 NOTE — Patient Outreach (Signed)
[facility]) Care Management  03/12/2017  Ryan Pacheco 1920-03-10 [id number]   Met with [name], SW at facility. She states patient discharged to [facility] today.  No [facility] community care management needs assessed.   Plan to sign off. [name]. [name], RN, BSN, [place] [phone]) Business Cell  [phone]) Toll Free Office

## 2017-03-14 DIAGNOSIS — Z435 Encounter for attention to cystostomy: Secondary | ICD-10-CM | POA: Diagnosis not present

## 2017-03-14 DIAGNOSIS — Z4781 Encounter for orthopedic aftercare following surgical amputation: Secondary | ICD-10-CM | POA: Diagnosis not present

## 2017-03-18 DIAGNOSIS — Z4781 Encounter for orthopedic aftercare following surgical amputation: Secondary | ICD-10-CM | POA: Diagnosis not present

## 2017-03-18 DIAGNOSIS — Z435 Encounter for attention to cystostomy: Secondary | ICD-10-CM | POA: Diagnosis not present

## 2017-03-19 DIAGNOSIS — Z435 Encounter for attention to cystostomy: Secondary | ICD-10-CM | POA: Diagnosis not present

## 2017-03-19 DIAGNOSIS — Z4781 Encounter for orthopedic aftercare following surgical amputation: Secondary | ICD-10-CM | POA: Diagnosis not present

## 2017-03-20 DIAGNOSIS — Z435 Encounter for attention to cystostomy: Secondary | ICD-10-CM | POA: Diagnosis not present

## 2017-03-20 DIAGNOSIS — Z4781 Encounter for orthopedic aftercare following surgical amputation: Secondary | ICD-10-CM | POA: Diagnosis not present

## 2017-03-21 DIAGNOSIS — E785 Hyperlipidemia, unspecified: Secondary | ICD-10-CM | POA: Diagnosis not present

## 2017-03-21 DIAGNOSIS — Z435 Encounter for attention to cystostomy: Secondary | ICD-10-CM | POA: Diagnosis not present

## 2017-03-21 DIAGNOSIS — Z4781 Encounter for orthopedic aftercare following surgical amputation: Secondary | ICD-10-CM | POA: Diagnosis not present

## 2017-03-21 DIAGNOSIS — Z79899 Other long term (current) drug therapy: Secondary | ICD-10-CM | POA: Diagnosis not present

## 2017-03-21 DIAGNOSIS — D649 Anemia, unspecified: Secondary | ICD-10-CM | POA: Diagnosis not present

## 2017-03-25 DIAGNOSIS — Z4781 Encounter for orthopedic aftercare following surgical amputation: Secondary | ICD-10-CM | POA: Diagnosis not present

## 2017-03-25 DIAGNOSIS — Z435 Encounter for attention to cystostomy: Secondary | ICD-10-CM | POA: Diagnosis not present

## 2017-03-27 DIAGNOSIS — Z4781 Encounter for orthopedic aftercare following surgical amputation: Secondary | ICD-10-CM | POA: Diagnosis not present

## 2017-03-27 DIAGNOSIS — Z435 Encounter for attention to cystostomy: Secondary | ICD-10-CM | POA: Diagnosis not present

## 2017-03-31 DIAGNOSIS — Z4781 Encounter for orthopedic aftercare following surgical amputation: Secondary | ICD-10-CM | POA: Diagnosis not present

## 2017-03-31 DIAGNOSIS — Z435 Encounter for attention to cystostomy: Secondary | ICD-10-CM | POA: Diagnosis not present

## 2017-04-01 DIAGNOSIS — Z435 Encounter for attention to cystostomy: Secondary | ICD-10-CM | POA: Diagnosis not present

## 2017-04-01 DIAGNOSIS — Z4781 Encounter for orthopedic aftercare following surgical amputation: Secondary | ICD-10-CM | POA: Diagnosis not present

## 2017-04-02 DIAGNOSIS — Z4781 Encounter for orthopedic aftercare following surgical amputation: Secondary | ICD-10-CM | POA: Diagnosis not present

## 2017-04-02 DIAGNOSIS — Z435 Encounter for attention to cystostomy: Secondary | ICD-10-CM | POA: Diagnosis not present

## 2017-04-03 ENCOUNTER — Encounter: Payer: Self-pay | Admitting: Interventional Cardiology

## 2017-04-03 ENCOUNTER — Ambulatory Visit (INDEPENDENT_AMBULATORY_CARE_PROVIDER_SITE_OTHER): Payer: Medicare Other | Admitting: Interventional Cardiology

## 2017-04-03 VITALS — BP 126/60 | HR 64 | Ht 67.0 in

## 2017-04-03 DIAGNOSIS — I25119 Atherosclerotic heart disease of native coronary artery with unspecified angina pectoris: Secondary | ICD-10-CM

## 2017-04-03 DIAGNOSIS — E782 Mixed hyperlipidemia: Secondary | ICD-10-CM | POA: Diagnosis not present

## 2017-04-03 DIAGNOSIS — I252 Old myocardial infarction: Secondary | ICD-10-CM | POA: Diagnosis not present

## 2017-04-03 DIAGNOSIS — I350 Nonrheumatic aortic (valve) stenosis: Secondary | ICD-10-CM | POA: Diagnosis not present

## 2017-04-03 DIAGNOSIS — Z4781 Encounter for orthopedic aftercare following surgical amputation: Secondary | ICD-10-CM | POA: Diagnosis not present

## 2017-04-03 DIAGNOSIS — Z435 Encounter for attention to cystostomy: Secondary | ICD-10-CM | POA: Diagnosis not present

## 2017-04-03 MED ORDER — ATORVASTATIN CALCIUM 10 MG PO TABS: 10.0000 mg | ORAL_TABLET | Freq: Every day | ORAL | 3 refills | Status: AC

## 2017-04-03 MED ORDER — ASPIRIN EC 81 MG PO TBEC: 81.0000 mg | DELAYED_RELEASE_TABLET | Freq: Every day | ORAL | 3 refills | Status: AC

## 2017-04-03 NOTE — Patient Instructions (Signed)
Medication Instructions:  Your physician has recommended you make the following change in your medication:   START aspirin 81 mg daily  START atorvastatin 10 mg daily    Labwork: None ordered  Testing/Procedures: None ordered  Follow-Up: Your physician wants you to follow-up in: 1 year with Dr. Irish Lack. You will receive a reminder letter in the mail two months in advance. If you don't receive a letter, please call our office to schedule the follow-up appointment.   Any Other Special Instructions Will Be Listed Below (If Applicable).     If you need a refill on your cardiac medications before your next appointment, please call your pharmacy.

## 2017-04-03 NOTE — Progress Notes (Signed)
Cardiology Office Note   Date:  04/03/2017   ID:  Ryan Pacheco, DOB October 28, 1920, MRN [id number]  PCP:  Maury Dus, MD    No chief complaint on file. CAD   Wt Readings from Last 3 Encounters:  02/27/17 160 lb (72.6 kg)  02/15/17 160 lb 0.9 oz (72.6 kg)  08/05/16 181 lb (82.1 kg)       History of Present Illness: Ryan Pacheco is a 83 y.o. male  who had CABG in Jan 2013 .  He has had falls in the past and went to an asssted living facility.    There was an issue with bladder outlet obstruction in 2017.  He now hsa a suprapubic catheter.    He has had issues with bladder outlet obstruction and osteomyelitis.  He required a toe amputation in early 2018.  Denies : Chest pain. Dizziness. Leg edema. Nitroglycerin use. Orthopnea. Palpitations. Paroxysmal nocturnal dyspnea. Shortness of breath. Syncope.   Walking is limited by his foot problem.  His therapy stopped after he left rehab. He is in an assisted living facility.    He has had edema in the past.  He was on a diuretic at that time.     Past Medical History:  Diagnosis Date  . Acid reflux   . Angina   . Arthritis 01-09-12   hands, foot  . Benign prostatic hypertrophy   . Bradycardia   . Chronic kidney disease    Stage 1  through Stage 4 of unspecified chronic kidney disease.  . Diabetes mellitus   . Edema   . Embolism - blood clot ~ 2000   "behind left knee"  . Foley catheter in place    at present to leg bag  . Hearing impaired person, bilateral    right ear hearing aid-"loss other"  . History of asbestos exposure    "have some lung disease from years of exposure"  . Impaired gait and mobility    ambulates with walker- prone to frequent falls  . Myocardial infarction [facility]) 11/17/10    NSTEMI- Dr. Irish Lack follows  . Prostate hypertrophy 01-09-12   urinary retention-Foley catheter at present; surgery planned    Past Surgical History:  Procedure Laterality Date  . AMPUTATION Right 02/19/2017   Procedure: 2nd Ray Amputation Right Foot;  Surgeon: Newt Minion, MD;  Location: [place];  Service: Orthopedics;  Laterality: Right;  . BLEPHAROPLASTY     left eye  . CARDIAC CATHETERIZATION  01-09-12   1'13  . CATARACT EXTRACTION, BILATERAL  ~ 2010  . CHOLECYSTECTOMY  1980's  . CORONARY ARTERY BYPASS GRAFT  11/26/2011   Procedure:x4- CORONARY ARTERY BYPASS GRAFTING (CABG);  Surgeon: Gaye Pollack, MD;  Location: [place];  Service: Open Heart Surgery;  Laterality: N/A;  . INSERTION OF SUPRAPUBIC CATHETER N/A 08/05/2016   Procedure: CYSTOSCOPY AND INSERTION OF SUPRAPUBIC CATHETER;  Surgeon: Carolan Clines, MD;  [facility];  Service: Urology;  Laterality: N/A;  . LEFT HEART CATHETERIZATION WITH CORONARY ANGIOGRAM N/A 11/20/2011   Procedure: LEFT HEART CATHETERIZATION WITH CORONARY ANGIOGRAM;  Surgeon: Jettie Booze, MD;  Location: [facility] CATH LAB;  Service: Cardiovascular;  Laterality: N/A;  possible PCI  . Suprapubic tube     3'13, then removed.     Current Outpatient Prescriptions  Medication Sig Dispense Refill  . amLODipine (NORVASC) 10 MG tablet Take 10 mg by mouth daily.    Marland Kitchen glimepiride (AMARYL) 1 MG tablet Take 1 mg by mouth  daily.    . latanoprost (XALATAN) 0.005 % ophthalmic solution Place 1 drop into both eyes daily.     No current facility-administered medications for this visit.     Allergies:   No known allergies    Social History:  The patient  reports that he has quit smoking. His smoking use included Cigars. He has quit using smokeless tobacco. His smokeless tobacco use included Chew. He reports that he does not drink alcohol or use drugs.   Family History:  The patient's family history includes Heart attack in his father.    ROS:  Please see the history of present illness.   Otherwise, review of systems are positive for recent toe amputation; mild leg edema.   All other systems are reviewed and negative.    PHYSICAL EXAM: VS:  BP 126/60   Pulse 64   Ht  5\' 7"  (1.702 m)   SpO2 98%  , BMI There is no height or weight on file to calculate BMI. GEN: Well nourished, well developed, in no acute distress  HEENT: normal  Neck: no JVD, carotid bruits, or masses Cardiac: RRR, premature beats; 2/6 systolic murmurs,no rubs, or gallops,;tr edema in both legs Respiratory:  clear to auscultation bilaterally, normal work of breathing GI: soft, nontender, nondistended, + BS MS: no deformity or atrophy  Skin: warm and dry, no rash Neuro:  Strength and sensation are intact Psych: euthymic mood, full affect   EKG:   The ekg ordered 4/18 demonstrates NSR with ventricular bigeminy   Recent Labs: 05/19/2016: B Natriuretic Peptide 363.2 02/15/2017: ALT 19; TSH 4.666 02/20/2017: BUN 19; Creatinine, Ser 0.96; Hemoglobin 11.8; Magnesium 2.1; Platelets 217; Potassium 4.0; Sodium 140   Lipid Panel    Component Value Date/Time   CHOL 124 06/15/2014 0921   TRIG 100.0 06/15/2014 0921   HDL 52.50 06/15/2014 0921   CHOLHDL 2 06/15/2014 0921   VLDL 20.0 06/15/2014 0921   LDLCALC 52 06/15/2014 0921     Other studies Reviewed: Additional studies/ records that were reviewed today with results demonstrating: Hospital records from 4/18 reviewed.   ASSESSMENT AND PLAN:  1. CAD: Angina controlled on medical therapy. His activity level has dropped off. He is spending much more time in a wheelchair now.  Restart aspirin 81 mg daily. Restart atorvastatin 10 mg daily for secondary prevention. 2. Old MI: No signs of congestive heart failure. Volume overload that he had in the past is much better, despite being off of his diuretic. Okay to continue to hold diuretics. Could restart furosemide if he develops significant lower extremity swelling. 3. Hyperlipidemia: Restart atorvastatin 10 mg daily.   4. Mild carotid Plaque by 2017 Doppler. 5. Mild AS noted in 2013.  Murmur present.  6. Leg edema: Elevate legs.   Current medicines are reviewed at length with the patient  today.  The patient concerns regarding his medicines were addressed.  The following changes have been made:  Add aspirin and atorvastatin  Labs/ tests ordered today include:  No orders of the defined types were placed in this encounter.   Recommend 150 minutes/week of aerobic exercise Low fat, low carb, high fiber diet recommended  Disposition:   FU in 1 year   Signed, Larae Grooms, MD  04/03/2017 2:49 PM    [facility] [place], [place], [place]  [address] Phone: [phone]; Fax: [phone]

## 2017-04-04 ENCOUNTER — Encounter (HOSPITAL_COMMUNITY): Payer: Self-pay | Admitting: Orthopedic Surgery

## 2017-04-04 NOTE — Addendum Note (Signed)
Addendum  created 04/04/17 1202 by Lyn Hollingshead, MD   Sign clinical note

## 2017-04-08 DIAGNOSIS — Z4781 Encounter for orthopedic aftercare following surgical amputation: Secondary | ICD-10-CM | POA: Diagnosis not present

## 2017-04-08 DIAGNOSIS — Z435 Encounter for attention to cystostomy: Secondary | ICD-10-CM | POA: Diagnosis not present

## 2017-04-10 DIAGNOSIS — Z435 Encounter for attention to cystostomy: Secondary | ICD-10-CM | POA: Diagnosis not present

## 2017-04-10 DIAGNOSIS — Z4781 Encounter for orthopedic aftercare following surgical amputation: Secondary | ICD-10-CM | POA: Diagnosis not present

## 2017-04-12 DIAGNOSIS — B351 Tinea unguium: Secondary | ICD-10-CM | POA: Diagnosis not present

## 2017-04-14 DIAGNOSIS — F324 Major depressive disorder, single episode, in partial remission: Secondary | ICD-10-CM | POA: Diagnosis not present

## 2017-04-14 DIAGNOSIS — R609 Edema, unspecified: Secondary | ICD-10-CM | POA: Diagnosis not present

## 2017-04-15 DIAGNOSIS — Z4781 Encounter for orthopedic aftercare following surgical amputation: Secondary | ICD-10-CM | POA: Diagnosis not present

## 2017-04-15 DIAGNOSIS — Z435 Encounter for attention to cystostomy: Secondary | ICD-10-CM | POA: Diagnosis not present

## 2017-04-18 DIAGNOSIS — Z4781 Encounter for orthopedic aftercare following surgical amputation: Secondary | ICD-10-CM | POA: Diagnosis not present

## 2017-04-18 DIAGNOSIS — Z435 Encounter for attention to cystostomy: Secondary | ICD-10-CM | POA: Diagnosis not present

## 2017-04-22 DIAGNOSIS — Z4781 Encounter for orthopedic aftercare following surgical amputation: Secondary | ICD-10-CM | POA: Diagnosis not present

## 2017-04-22 DIAGNOSIS — Z435 Encounter for attention to cystostomy: Secondary | ICD-10-CM | POA: Diagnosis not present

## 2017-04-28 DIAGNOSIS — Z435 Encounter for attention to cystostomy: Secondary | ICD-10-CM | POA: Diagnosis not present

## 2017-04-28 DIAGNOSIS — Z4781 Encounter for orthopedic aftercare following surgical amputation: Secondary | ICD-10-CM | POA: Diagnosis not present

## 2017-05-01 DIAGNOSIS — Z435 Encounter for attention to cystostomy: Secondary | ICD-10-CM | POA: Diagnosis not present

## 2017-05-01 DIAGNOSIS — Z4781 Encounter for orthopedic aftercare following surgical amputation: Secondary | ICD-10-CM | POA: Diagnosis not present

## 2017-05-09 DIAGNOSIS — Z4781 Encounter for orthopedic aftercare following surgical amputation: Secondary | ICD-10-CM | POA: Diagnosis not present

## 2017-05-09 DIAGNOSIS — Z435 Encounter for attention to cystostomy: Secondary | ICD-10-CM | POA: Diagnosis not present

## 2017-05-13 DIAGNOSIS — Z466 Encounter for fitting and adjustment of urinary device: Secondary | ICD-10-CM | POA: Diagnosis not present

## 2017-05-13 DIAGNOSIS — Z435 Encounter for attention to cystostomy: Secondary | ICD-10-CM | POA: Diagnosis not present

## 2017-05-29 DIAGNOSIS — Z435 Encounter for attention to cystostomy: Secondary | ICD-10-CM | POA: Diagnosis not present

## 2017-05-29 DIAGNOSIS — Z466 Encounter for fitting and adjustment of urinary device: Secondary | ICD-10-CM | POA: Diagnosis not present

## 2017-06-10 DIAGNOSIS — Z466 Encounter for fitting and adjustment of urinary device: Secondary | ICD-10-CM | POA: Diagnosis not present

## 2017-06-10 DIAGNOSIS — Z435 Encounter for attention to cystostomy: Secondary | ICD-10-CM | POA: Diagnosis not present

## 2017-06-13 DIAGNOSIS — Z466 Encounter for fitting and adjustment of urinary device: Secondary | ICD-10-CM | POA: Diagnosis not present

## 2017-06-13 DIAGNOSIS — Z435 Encounter for attention to cystostomy: Secondary | ICD-10-CM | POA: Diagnosis not present

## 2017-06-17 DIAGNOSIS — Z9181 History of falling: Secondary | ICD-10-CM | POA: Diagnosis not present

## 2017-06-17 DIAGNOSIS — Z466 Encounter for fitting and adjustment of urinary device: Secondary | ICD-10-CM | POA: Diagnosis not present

## 2017-06-17 DIAGNOSIS — E7800 Pure hypercholesterolemia, unspecified: Secondary | ICD-10-CM | POA: Diagnosis not present

## 2017-06-17 DIAGNOSIS — Z435 Encounter for attention to cystostomy: Secondary | ICD-10-CM | POA: Diagnosis not present

## 2017-06-17 DIAGNOSIS — R600 Localized edema: Secondary | ICD-10-CM | POA: Diagnosis not present

## 2017-06-17 DIAGNOSIS — F324 Major depressive disorder, single episode, in partial remission: Secondary | ICD-10-CM | POA: Diagnosis not present

## 2017-06-19 DIAGNOSIS — Z466 Encounter for fitting and adjustment of urinary device: Secondary | ICD-10-CM | POA: Diagnosis not present

## 2017-06-19 DIAGNOSIS — Z435 Encounter for attention to cystostomy: Secondary | ICD-10-CM | POA: Diagnosis not present

## 2017-06-23 DIAGNOSIS — Z466 Encounter for fitting and adjustment of urinary device: Secondary | ICD-10-CM | POA: Diagnosis not present

## 2017-06-23 DIAGNOSIS — Z435 Encounter for attention to cystostomy: Secondary | ICD-10-CM | POA: Diagnosis not present

## 2017-06-26 DIAGNOSIS — Z435 Encounter for attention to cystostomy: Secondary | ICD-10-CM | POA: Diagnosis not present

## 2017-06-26 DIAGNOSIS — Z466 Encounter for fitting and adjustment of urinary device: Secondary | ICD-10-CM | POA: Diagnosis not present

## 2017-07-01 DIAGNOSIS — Z466 Encounter for fitting and adjustment of urinary device: Secondary | ICD-10-CM | POA: Diagnosis not present

## 2017-07-01 DIAGNOSIS — Z435 Encounter for attention to cystostomy: Secondary | ICD-10-CM | POA: Diagnosis not present

## 2017-07-04 DIAGNOSIS — Z435 Encounter for attention to cystostomy: Secondary | ICD-10-CM | POA: Diagnosis not present

## 2017-07-04 DIAGNOSIS — B351 Tinea unguium: Secondary | ICD-10-CM | POA: Diagnosis not present

## 2017-07-04 DIAGNOSIS — Z466 Encounter for fitting and adjustment of urinary device: Secondary | ICD-10-CM | POA: Diagnosis not present

## 2017-07-05 ENCOUNTER — Emergency Department (HOSPITAL_COMMUNITY): Payer: Medicare Other

## 2017-07-05 ENCOUNTER — Encounter (HOSPITAL_COMMUNITY): Payer: Self-pay | Admitting: Emergency Medicine

## 2017-07-05 ENCOUNTER — Inpatient Hospital Stay (HOSPITAL_COMMUNITY)
Admission: EM | Admit: 2017-07-05 | Discharge: 2017-07-28 | DRG: 871 | Disposition: E | Payer: Medicare Other | Attending: Pulmonary Disease | Admitting: Pulmonary Disease

## 2017-07-05 DIAGNOSIS — J189 Pneumonia, unspecified organism: Secondary | ICD-10-CM | POA: Diagnosis not present

## 2017-07-05 DIAGNOSIS — R339 Retention of urine, unspecified: Secondary | ICD-10-CM | POA: Diagnosis present

## 2017-07-05 DIAGNOSIS — E785 Hyperlipidemia, unspecified: Secondary | ICD-10-CM | POA: Diagnosis present

## 2017-07-05 DIAGNOSIS — I252 Old myocardial infarction: Secondary | ICD-10-CM

## 2017-07-05 DIAGNOSIS — M19041 Primary osteoarthritis, right hand: Secondary | ICD-10-CM | POA: Diagnosis present

## 2017-07-05 DIAGNOSIS — J13 Pneumonia due to Streptococcus pneumoniae: Secondary | ICD-10-CM | POA: Diagnosis present

## 2017-07-05 DIAGNOSIS — Z7982 Long term (current) use of aspirin: Secondary | ICD-10-CM | POA: Diagnosis not present

## 2017-07-05 DIAGNOSIS — Z9842 Cataract extraction status, left eye: Secondary | ICD-10-CM | POA: Diagnosis not present

## 2017-07-05 DIAGNOSIS — H9190 Unspecified hearing loss, unspecified ear: Secondary | ICD-10-CM | POA: Diagnosis present

## 2017-07-05 DIAGNOSIS — N170 Acute kidney failure with tubular necrosis: Secondary | ICD-10-CM | POA: Diagnosis present

## 2017-07-05 DIAGNOSIS — G9341 Metabolic encephalopathy: Secondary | ICD-10-CM | POA: Diagnosis present

## 2017-07-05 DIAGNOSIS — M19079 Primary osteoarthritis, unspecified ankle and foot: Secondary | ICD-10-CM | POA: Diagnosis present

## 2017-07-05 DIAGNOSIS — Z9841 Cataract extraction status, right eye: Secondary | ICD-10-CM

## 2017-07-05 DIAGNOSIS — Z87891 Personal history of nicotine dependence: Secondary | ICD-10-CM | POA: Diagnosis not present

## 2017-07-05 DIAGNOSIS — Z7984 Long term (current) use of oral hypoglycemic drugs: Secondary | ICD-10-CM

## 2017-07-05 DIAGNOSIS — Z79899 Other long term (current) drug therapy: Secondary | ICD-10-CM

## 2017-07-05 DIAGNOSIS — E878 Other disorders of electrolyte and fluid balance, not elsewhere classified: Secondary | ICD-10-CM | POA: Diagnosis present

## 2017-07-05 DIAGNOSIS — J9601 Acute respiratory failure with hypoxia: Secondary | ICD-10-CM | POA: Diagnosis not present

## 2017-07-05 DIAGNOSIS — Z66 Do not resuscitate: Secondary | ICD-10-CM | POA: Diagnosis present

## 2017-07-05 DIAGNOSIS — K219 Gastro-esophageal reflux disease without esophagitis: Secondary | ICD-10-CM | POA: Diagnosis present

## 2017-07-05 DIAGNOSIS — Z77090 Contact with and (suspected) exposure to asbestos: Secondary | ICD-10-CM | POA: Diagnosis present

## 2017-07-05 DIAGNOSIS — R6521 Severe sepsis with septic shock: Secondary | ICD-10-CM | POA: Diagnosis present

## 2017-07-05 DIAGNOSIS — J9690 Respiratory failure, unspecified, unspecified whether with hypoxia or hypercapnia: Secondary | ICD-10-CM

## 2017-07-05 DIAGNOSIS — A419 Sepsis, unspecified organism: Secondary | ICD-10-CM | POA: Diagnosis not present

## 2017-07-05 DIAGNOSIS — Z89421 Acquired absence of other right toe(s): Secondary | ICD-10-CM

## 2017-07-05 DIAGNOSIS — Z515 Encounter for palliative care: Secondary | ICD-10-CM | POA: Diagnosis present

## 2017-07-05 DIAGNOSIS — R269 Unspecified abnormalities of gait and mobility: Secondary | ICD-10-CM | POA: Diagnosis present

## 2017-07-05 DIAGNOSIS — N400 Enlarged prostate without lower urinary tract symptoms: Secondary | ICD-10-CM | POA: Diagnosis present

## 2017-07-05 DIAGNOSIS — R402441 Other coma, without documented Glasgow coma scale score, or with partial score reported, in the field [EMT or ambulance]: Secondary | ICD-10-CM | POA: Diagnosis not present

## 2017-07-05 DIAGNOSIS — I129 Hypertensive chronic kidney disease with stage 1 through stage 4 chronic kidney disease, or unspecified chronic kidney disease: Secondary | ICD-10-CM | POA: Diagnosis present

## 2017-07-05 DIAGNOSIS — Z961 Presence of intraocular lens: Secondary | ICD-10-CM | POA: Diagnosis present

## 2017-07-05 DIAGNOSIS — Z8249 Family history of ischemic heart disease and other diseases of the circulatory system: Secondary | ICD-10-CM

## 2017-07-05 DIAGNOSIS — L8990 Pressure ulcer of unspecified site, unspecified stage: Secondary | ICD-10-CM | POA: Insufficient documentation

## 2017-07-05 DIAGNOSIS — I2510 Atherosclerotic heart disease of native coronary artery without angina pectoris: Secondary | ICD-10-CM | POA: Diagnosis present

## 2017-07-05 DIAGNOSIS — J920 Pleural plaque with presence of asbestos: Secondary | ICD-10-CM | POA: Diagnosis present

## 2017-07-05 DIAGNOSIS — E1122 Type 2 diabetes mellitus with diabetic chronic kidney disease: Secondary | ICD-10-CM | POA: Diagnosis present

## 2017-07-05 DIAGNOSIS — A403 Sepsis due to Streptococcus pneumoniae: Secondary | ICD-10-CM | POA: Diagnosis not present

## 2017-07-05 DIAGNOSIS — N390 Urinary tract infection, site not specified: Secondary | ICD-10-CM | POA: Diagnosis present

## 2017-07-05 DIAGNOSIS — E872 Acidosis: Secondary | ICD-10-CM | POA: Diagnosis present

## 2017-07-05 DIAGNOSIS — D6489 Other specified anemias: Secondary | ICD-10-CM | POA: Diagnosis present

## 2017-07-05 DIAGNOSIS — J90 Pleural effusion, not elsewhere classified: Secondary | ICD-10-CM | POA: Diagnosis not present

## 2017-07-05 DIAGNOSIS — R011 Cardiac murmur, unspecified: Secondary | ICD-10-CM | POA: Diagnosis not present

## 2017-07-05 DIAGNOSIS — J9600 Acute respiratory failure, unspecified whether with hypoxia or hypercapnia: Secondary | ICD-10-CM | POA: Diagnosis not present

## 2017-07-05 DIAGNOSIS — N189 Chronic kidney disease, unspecified: Secondary | ICD-10-CM | POA: Diagnosis present

## 2017-07-05 DIAGNOSIS — M19042 Primary osteoarthritis, left hand: Secondary | ICD-10-CM | POA: Diagnosis present

## 2017-07-05 DIAGNOSIS — Z951 Presence of aortocoronary bypass graft: Secondary | ICD-10-CM

## 2017-07-05 DIAGNOSIS — Z4682 Encounter for fitting and adjustment of non-vascular catheter: Secondary | ICD-10-CM | POA: Diagnosis not present

## 2017-07-05 LAB — I-STAT ARTERIAL BLOOD GAS, ED
Acid-base deficit: 10 mmol/L — ABNORMAL HIGH (ref 0.0–2.0)
Acid-base deficit: 9 mmol/L — ABNORMAL HIGH (ref 0.0–2.0)
Bicarbonate: 18.7 mmol/L — ABNORMAL LOW (ref 20.0–28.0)
Bicarbonate: 14.7 mmol/L — ABNORMAL LOW (ref 20.0–28.0)
O2 SAT: 99 %
O2 Saturation: 100 %
Patient temperature: 104
Patient temperature: 100.4
TCO2: 20 mmol/L — ABNORMAL LOW (ref 22–32)
TCO2: 16 mmol/L — AB (ref 22–32)
pCO2 arterial: 57 mmHg — ABNORMAL HIGH (ref 32.0–48.0)
pCO2 arterial: 28.1 mmHg — ABNORMAL LOW (ref 32.0–48.0)
pH, Arterial: 7.14 — CL (ref 7.350–7.450)
pH, Arterial: 7.332 — ABNORMAL LOW (ref 7.350–7.450)
pO2, Arterial: 245 mmHg — ABNORMAL HIGH (ref 83.0–108.0)
pO2, Arterial: 134 mmHg — ABNORMAL HIGH (ref 83.0–108.0)

## 2017-07-05 LAB — COMPREHENSIVE METABOLIC PANEL
ALT: 105 U/L — ABNORMAL HIGH (ref 17–63)
AST: 175 U/L — ABNORMAL HIGH (ref 15–41)
Albumin: 2.2 g/dL — ABNORMAL LOW (ref 3.5–5.0)
Alkaline Phosphatase: 81 U/L (ref 38–126)
Anion gap: 15 (ref 5–15)
BUN: 56 mg/dL — ABNORMAL HIGH (ref 6–20)
CO2: 13 mmol/L — ABNORMAL LOW (ref 22–32)
Calcium: 8 mg/dL — ABNORMAL LOW (ref 8.9–10.3)
Chloride: 110 mmol/L (ref 101–111)
Creatinine, Ser: 2.64 mg/dL — ABNORMAL HIGH (ref 0.61–1.24)
GFR calc Af Amer: 22 mL/min — ABNORMAL LOW (ref >60)
GFR calc non Af Amer: 19 mL/min — ABNORMAL LOW (ref >60)
Glucose, Bld: 88 mg/dL (ref 65–99)
Potassium: 5.5 mmol/L — ABNORMAL HIGH (ref 3.5–5.1)
Sodium: 138 mmol/L (ref 135–145)
Total Bilirubin: 1.7 mg/dL — ABNORMAL HIGH (ref 0.3–1.2)
Total Protein: 5.8 g/dL — ABNORMAL LOW (ref 6.5–8.1)

## 2017-07-05 LAB — TYPE AND SCREEN
ABO/RH(D): A POS
Antibody Screen: NEGATIVE

## 2017-07-05 LAB — CBG MONITORING, ED
GLUCOSE-CAPILLARY: 72 mg/dL (ref 65–99)
GLUCOSE-CAPILLARY: 95 mg/dL (ref 65–99)

## 2017-07-05 LAB — BLOOD CULTURE ID PANEL (REFLEXED)
Acinetobacter baumannii: NOT DETECTED
Candida albicans: NOT DETECTED
Candida glabrata: NOT DETECTED
Candida krusei: NOT DETECTED
Candida parapsilosis: NOT DETECTED
Candida tropicalis: NOT DETECTED
Carbapenem resistance: NOT DETECTED
Enterobacter cloacae complex: NOT DETECTED
Enterobacteriaceae species: NOT DETECTED
Enterococcus species: NOT DETECTED
Escherichia coli: NOT DETECTED
Haemophilus influenzae: NOT DETECTED
Klebsiella oxytoca: NOT DETECTED
Klebsiella pneumoniae: NOT DETECTED
Listeria monocytogenes: NOT DETECTED
Methicillin resistance: NOT DETECTED
Neisseria meningitidis: NOT DETECTED
Proteus species: NOT DETECTED
Pseudomonas aeruginosa: NOT DETECTED
Serratia marcescens: NOT DETECTED
Staphylococcus aureus (BCID): NOT DETECTED
Staphylococcus species: NOT DETECTED
Streptococcus agalactiae: NOT DETECTED
Streptococcus pneumoniae: DETECTED — AB
Streptococcus pyogenes: NOT DETECTED
Streptococcus species: DETECTED — AB
Vancomycin resistance: NOT DETECTED

## 2017-07-05 LAB — CBC WITH DIFFERENTIAL/PLATELET
Basophils Absolute: 0 10*3/uL (ref 0.0–0.1)
Basophils Relative: 0 %
Eosinophils Absolute: 0 10*3/uL (ref 0.0–0.7)
Eosinophils Relative: 0 %
HCT: 45.4 % (ref 39.0–52.0)
Hemoglobin: 14.7 g/dL (ref 13.0–17.0)
Lymphocytes Relative: 2 %
Lymphs Abs: 0.5 10*3/uL — ABNORMAL LOW (ref 0.7–4.0)
MCH: 29.8 pg (ref 26.0–34.0)
MCHC: 32.4 g/dL (ref 30.0–36.0)
MCV: 92.1 fL (ref 78.0–100.0)
Monocytes Absolute: 1 10*3/uL (ref 0.1–1.0)
Monocytes Relative: 5 %
Neutro Abs: 18.6 10*3/uL — ABNORMAL HIGH (ref 1.7–7.7)
Neutrophils Relative %: 93 %
Platelets: 205 10*3/uL (ref 150–400)
RBC: 4.93 MIL/uL (ref 4.22–5.81)
RDW: 15.7 % — ABNORMAL HIGH (ref 11.5–15.5)
WBC: 20.1 10*3/uL — ABNORMAL HIGH (ref 4.0–10.5)

## 2017-07-05 LAB — URINALYSIS, ROUTINE W REFLEX MICROSCOPIC
Bacteria, UA: NONE SEEN
Bilirubin Urine: NEGATIVE
Glucose, UA: NEGATIVE mg/dL
Ketones, ur: NEGATIVE mg/dL
Nitrite: NEGATIVE
Protein, ur: 100 mg/dL — AB
Specific Gravity, Urine: 1.019 (ref 1.005–1.030)
pH: 5 (ref 5.0–8.0)

## 2017-07-05 LAB — POCT I-STAT 3, ART BLOOD GAS (G3+)
ACID-BASE DEFICIT: 8 mmol/L — AB (ref 0.0–2.0)
Bicarbonate: 18.1 mmol/L — ABNORMAL LOW (ref 20.0–28.0)
O2 Saturation: 96 %
PH ART: 7.25 — AB (ref 7.350–7.450)
Patient temperature: 101.4
TCO2: 19 mmol/L — ABNORMAL LOW (ref 22–32)
pCO2 arterial: 42.2 mmHg (ref 32.0–48.0)
pO2, Arterial: 99 mmHg (ref 83.0–108.0)

## 2017-07-05 LAB — PROCALCITONIN: PROCALCITONIN: 15.4 ng/mL

## 2017-07-05 LAB — MAGNESIUM: Magnesium: 2.2 mg/dL (ref 1.7–2.4)

## 2017-07-05 LAB — GLUCOSE, CAPILLARY: Glucose-Capillary: 131 mg/dL — ABNORMAL HIGH (ref 65–99)

## 2017-07-05 LAB — I-STAT CG4 LACTIC ACID, ED
Lactic Acid, Venous: 4.29 mmol/L (ref 0.5–1.9)
Lactic Acid, Venous: 4.28 mmol/L (ref 0.5–1.9)

## 2017-07-05 LAB — LACTIC ACID, PLASMA
LACTIC ACID, VENOUS: 2.2 mmol/L — AB (ref 0.5–1.9)
Lactic Acid, Venous: 2.4 mmol/L (ref 0.5–1.9)

## 2017-07-05 LAB — I-STAT TROPONIN, ED: Troponin i, poc: 0.66 ng/mL (ref 0.00–0.08)

## 2017-07-05 LAB — PHOSPHORUS: Phosphorus: 5.5 mg/dL — ABNORMAL HIGH (ref 2.5–4.6)

## 2017-07-05 LAB — MRSA PCR SCREENING: MRSA BY PCR: POSITIVE — AB

## 2017-07-05 MED ORDER — MUPIROCIN 2 % EX OINT
1.0000 "application " | TOPICAL_OINTMENT | Freq: Two times a day (BID) | CUTANEOUS | Status: DC
  Administered 2017-07-05 – 2017-07-07 (×5): 1 via NASAL
  Filled 2017-07-05: qty 22

## 2017-07-05 MED ORDER — ASPIRIN 300 MG RE SUPP
300.0000 mg | Freq: Once | RECTAL | Status: AC
  Administered 2017-07-05: 300 mg via RECTAL
  Filled 2017-07-05: qty 1

## 2017-07-05 MED ORDER — PIPERACILLIN-TAZOBACTAM 3.375 G IVPB 30 MIN
3.3750 g | Freq: Once | INTRAVENOUS | Status: AC
  Administered 2017-07-05: 3.375 g via INTRAVENOUS
  Filled 2017-07-05: qty 50

## 2017-07-05 MED ORDER — SODIUM CHLORIDE 0.9 % IV BOLUS (SEPSIS)
500.0000 mL | Freq: Once | INTRAVENOUS | Status: AC
  Administered 2017-07-05: 500 mL via INTRAVENOUS

## 2017-07-05 MED ORDER — FAMOTIDINE IN NACL 20-0.9 MG/50ML-% IV SOLN
20.0000 mg | Freq: Two times a day (BID) | INTRAVENOUS | Status: DC
  Administered 2017-07-05: 20 mg via INTRAVENOUS
  Filled 2017-07-05 (×2): qty 50

## 2017-07-05 MED ORDER — PIPERACILLIN-TAZOBACTAM 3.375 G IVPB 30 MIN: 3.3750 g | Freq: Four times a day (QID) | INTRAVENOUS | Status: DC

## 2017-07-05 MED ORDER — PROPOFOL 1000 MG/100ML IV EMUL
INTRAVENOUS | Status: AC
  Filled 2017-07-05: qty 100

## 2017-07-05 MED ORDER — ATORVASTATIN CALCIUM 40 MG PO TABS
40.0000 mg | ORAL_TABLET | Freq: Every day | ORAL | Status: DC
  Administered 2017-07-05: 40 mg via ORAL
  Filled 2017-07-05 (×2): qty 1

## 2017-07-05 MED ORDER — FENTANYL 2500MCG IN NS 250ML (10MCG/ML) PREMIX INFUSION
50.0000 ug/h | INTRAVENOUS | Status: DC
  Administered 2017-07-05: 50 ug/h via INTRAVENOUS
  Filled 2017-07-05: qty 250

## 2017-07-05 MED ORDER — VANCOMYCIN HCL IN DEXTROSE 1-5 GM/200ML-% IV SOLN: 1000.0000 mg | Freq: Two times a day (BID) | INTRAVENOUS | Status: DC

## 2017-07-05 MED ORDER — VANCOMYCIN HCL IN DEXTROSE 1-5 GM/200ML-% IV SOLN: 1000.0000 mg | INTRAVENOUS | Status: DC

## 2017-07-05 MED ORDER — VANCOMYCIN HCL 10 G IV SOLR
1500.0000 mg | Freq: Once | INTRAVENOUS | Status: AC
  Administered 2017-07-05: 1500 mg via INTRAVENOUS
  Filled 2017-07-05: qty 1500

## 2017-07-05 MED ORDER — PANTOPRAZOLE SODIUM 40 MG IV SOLR
40.0000 mg | Freq: Once | INTRAVENOUS | Status: AC
  Administered 2017-07-05: 40 mg via INTRAVENOUS
  Filled 2017-07-05: qty 40

## 2017-07-05 MED ORDER — PROPOFOL 1000 MG/100ML IV EMUL
5.0000 ug/kg/min | INTRAVENOUS | Status: DC
  Administered 2017-07-05: 15 ug/kg/min via INTRAVENOUS
  Filled 2017-07-05: qty 100

## 2017-07-05 MED ORDER — SODIUM CHLORIDE 0.9 % IV SOLN: 250.0000 mL | INTRAVENOUS | Status: DC | PRN

## 2017-07-05 MED ORDER — ORAL CARE MOUTH RINSE
15.0000 mL | Freq: Four times a day (QID) | OROMUCOSAL | Status: DC
  Administered 2017-07-05 – 2017-07-08 (×13): 15 mL via OROMUCOSAL

## 2017-07-05 MED ORDER — CHLORHEXIDINE GLUCONATE 0.12% ORAL RINSE (MEDLINE KIT)
15.0000 mL | Freq: Two times a day (BID) | OROMUCOSAL | Status: DC
  Administered 2017-07-05 – 2017-07-08 (×7): 15 mL via OROMUCOSAL

## 2017-07-05 MED ORDER — PIPERACILLIN-TAZOBACTAM IN DEX 2-0.25 GM/50ML IV SOLN
2.2500 g | Freq: Four times a day (QID) | INTRAVENOUS | Status: DC
  Administered 2017-07-05 – 2017-07-06 (×4): 2.25 g via INTRAVENOUS
  Filled 2017-07-05 (×6): qty 50

## 2017-07-05 MED ORDER — SODIUM CHLORIDE 0.9 % IV BOLUS (SEPSIS)
2000.0000 mL | Freq: Once | INTRAVENOUS | Status: AC
  Administered 2017-07-05: 2000 mL via INTRAVENOUS

## 2017-07-05 MED ORDER — ALBUTEROL SULFATE (2.5 MG/3ML) 0.083% IN NEBU: 2.5000 mg | INHALATION_SOLUTION | RESPIRATORY_TRACT | Status: AC | PRN

## 2017-07-05 MED ORDER — LACTATED RINGERS IV SOLN
INTRAVENOUS | Status: DC
  Administered 2017-07-05: 10:00:00 via INTRAVENOUS

## 2017-07-05 MED ORDER — CHLORHEXIDINE GLUCONATE CLOTH 2 % EX PADS
6.0000 | MEDICATED_PAD | Freq: Every day | CUTANEOUS | Status: DC
  Administered 2017-07-07 – 2017-07-08 (×2): 6 via TOPICAL

## 2017-07-05 MED ORDER — ASPIRIN 81 MG PO CHEW: 324.0000 mg | CHEWABLE_TABLET | ORAL | Status: DC

## 2017-07-05 MED ORDER — ASPIRIN 325 MG PO TABS: 325.0000 mg | ORAL_TABLET | Freq: Every day | ORAL | Status: DC

## 2017-07-05 MED ORDER — ROCURONIUM BROMIDE 50 MG/5ML IV SOLN
INTRAVENOUS | Status: DC | PRN
  Administered 2017-07-05: 80 mg via INTRAVENOUS

## 2017-07-05 MED ORDER — ENOXAPARIN SODIUM 30 MG/0.3ML ~~LOC~~ SOLN
30.0000 mg | SUBCUTANEOUS | Status: DC
  Administered 2017-07-05 – 2017-07-07 (×3): 30 mg via SUBCUTANEOUS
  Filled 2017-07-05 (×4): qty 0.3

## 2017-07-05 MED ORDER — PROPOFOL 1000 MG/100ML IV EMUL
5.0000 ug/kg/min | Freq: Once | INTRAVENOUS | Status: AC
Start: 2017-07-05 — End: 2017-07-05
  Administered 2017-07-05: 5 ug/kg/min via INTRAVENOUS
  Filled 2017-07-05: qty 100

## 2017-07-05 MED ORDER — FAMOTIDINE IN NACL 20-0.9 MG/50ML-% IV SOLN
20.0000 mg | INTRAVENOUS | Status: DC
  Filled 2017-07-05: qty 50

## 2017-07-05 MED ORDER — INSULIN ASPART 100 UNIT/ML ~~LOC~~ SOLN
0.0000 [IU] | SUBCUTANEOUS | Status: DC
  Administered 2017-07-05: 1 [IU] via SUBCUTANEOUS
  Administered 2017-07-06: 2 [IU] via SUBCUTANEOUS
  Administered 2017-07-07: 1 [IU] via SUBCUTANEOUS
  Administered 2017-07-07: 2 [IU] via SUBCUTANEOUS
  Administered 2017-07-07 (×2): 1 [IU] via SUBCUTANEOUS
  Administered 2017-07-07 – 2017-07-08 (×4): 2 [IU] via SUBCUTANEOUS

## 2017-07-05 MED ORDER — ETOMIDATE 2 MG/ML IV SOLN
INTRAVENOUS | Status: DC | PRN
  Administered 2017-07-05: 20 mg via INTRAVENOUS

## 2017-07-05 MED ORDER — ACETAMINOPHEN 650 MG RE SUPP
650.0000 mg | Freq: Once | RECTAL | Status: AC
  Administered 2017-07-05: 650 mg via RECTAL
  Filled 2017-07-05: qty 1

## 2017-07-05 MED ORDER — ASPIRIN 300 MG RE SUPP: 300.0000 mg | RECTAL | Status: DC

## 2017-07-05 NOTE — ED Notes (Signed)
Pt breathing over the vent, propofol increased but despite efforts patient is becoming difficult to sedate. CCM paged.

## 2017-07-05 NOTE — Progress Notes (Signed)
Pharmacy Antibiotic Note  Ryan Pacheco is a 83 y.o. male admitted on 07/25/2017 with fever and hypoxia after being found down at nursing home.  Patient and family do not want aggressive measures.  Pharmacy has been consulted for vancomycin dosing for sepsis.  Patient has AKI (SCr 0.96 > 2.64).  Tmax 104, WBC 20.1, LA 4.29.  Noted patient already received first doses of vancomycin and Zosyn.   Plan: Change vanc to 1gm IV Q48H Change Zosyn to 2.25gm IV Q6H Monitor renal fxn, clinical progress, vanc trough at Css Consider holding Lipitor with elevated LFTs   Height: 5\' 7"  (170.2 cm) Weight: 160 lb (72.6 kg) IBW/kg (Calculated) : 66.1  Temp (24hrs), Avg:104 F (40 C), Min:104 F (40 C), Max:104 F (40 C)   Recent Labs Lab 07/23/2017 0723 06/28/2017 0739 07/09/2017 0923 07/17/2017 1054  WBC 20.1*  --   --   --   CREATININE  --   --  2.64*  --   LATICACIDVEN  --  4.29*  --  4.28*    Estimated Creatinine Clearance: 15.6 mL/min (A) (by C-G formula based on SCr of 2.64 mg/dL (H)).    No Known Allergies   Vanc 9/8 >> Zosyn 9/8 >>  9/8 BCx -    [name], PharmD, BCPS Pager:  [phone] 07/04/2017, 11:01 AM

## 2017-07-05 NOTE — Progress Notes (Signed)
eLink Physician-Brief Progress Note Patient Name: Ryan Pacheco DOB: 10-Jun-1920 MRN: [id number]   Date of Service  07/02/2017  HPI/Events of Note  Lactic Acid level = 4.4 --> 2.4 --> 2.2.  eICU Interventions  Will bolus with 0.9 NaCl 500 mL IV over 30 minutes now.      Intervention Category Major Interventions: Acid-Base disturbance - evaluation and management  [name] 07/17/2017, 8:44 PM

## 2017-07-05 NOTE — H&P (Addendum)
PULMONARY / CRITICAL CARE MEDICINE   Name: Ryan Pacheco MRN: [id number] DOB: April 12, 1920    ADMISSION DATE:  07/04/2017  CHIEF COMPLAINT:  83 year old nursing home resident found down, febrile and hypoxic.   HISTORY OF PRESENT ILLNESS:   This 83 year old lives in an ALF due to an inability to ambulate independently. He has been treated for a, "cold" with tylenol and Mucinex for the past week. This morning he was found unresponsive and hypoxic. He was intubated in the ED before it was discovered that he was a DNR. His temp was 104 degrees on arrival. There is a suprapubic catheter in place, and an abnormal CXR. He has received vancomycin and Zosyn.     PAST MEDICAL HISTORY :  He  has a past medical history of Acid reflux; Angina; Arthritis (01-09-12); Benign prostatic hypertrophy; Bradycardia; Chronic kidney disease; Diabetes mellitus; Edema; Embolism - blood clot (~ 2000); Foley catheter in place; Hearing impaired person, bilateral; History of asbestos exposure; Impaired gait and mobility; Myocardial infarction [facility]) (11/17/10 ); and Prostate hypertrophy (01-09-12).  PAST SURGICAL HISTORY: He  has a past surgical history that includes Cholecystectomy (1980's); Cataract extraction, bilateral (~ 2010); Blepharoplasty; Coronary artery bypass graft (11/26/2011); Cardiac catheterization (01-09-12); left heart catheterization with coronary angiogram (N/A, 11/20/2011); Suprapubic tube; Insertion of suprapubic catheter (N/A, 08/05/2016); and Amputation (Right, 02/19/2017).  No Known Allergies  No current facility-administered medications on file prior to encounter.    Current Outpatient Prescriptions on File Prior to Encounter  Medication Sig  . aspirin EC 81 MG tablet Take 1 tablet (81 mg total) by mouth daily.  Marland Kitchen atorvastatin (LIPITOR) 10 MG tablet Take 1 tablet (10 mg total) by mouth daily.    FAMILY HISTORY:  His indicated that his mother is deceased. He indicated that his father is deceased. He  indicated that the status of his neg hx is unknown.    SOCIAL HISTORY: He  reports that he has quit smoking. His smoking use included Cigars. He has quit using smokeless tobacco. His smokeless tobacco use included Chew. He reports that he does not drink alcohol or use drugs.  REVIEW OF SYSTEMS:   10 system ROS obtained from his daughter is positive for the following,  1. GEN. Mentation remains good, but his ambulation is very limited. 2. PSYCH. Daughter feels he is depressed due to his limited mobility, and states, "I'm ready to go." 3.CV. No CP since CABG 5 years ago 4.GU. Severe urinary retention requiring suprapubic catheter placement 5. ENDO. Daughter denies glucose intolerance although it is listed on the chart.  Remainder of ROS is negative SUBJECTIVE:  N/A  VITAL SIGNS: BP (!) 154/83   Pulse (!) 107   Temp (!) 104 F (40 C) (Rectal)   Resp 20   Ht 5\' 7"  (1.702 m)   Wt 160 lb (72.6 kg)   SpO2 100%   BMI 25.06 kg/m   HEMODYNAMICS:    VENTILATOR SETTINGS: Vent Mode: PRVC FiO2 (%):  [50 %-100 %] 50 % Set Rate:  [16 bmp-20 bmp] 20 bmp Vt Set:  [530 mL-540 mL] 530 mL PEEP:  [5 cmH20] 5 cmH20 Plateau Pressure:  [27 cmH20] 27 cmH20  INTAKE / OUTPUT: No intake/output data recorded.  PHYSICAL EXAMINATION: General:  Somewhat thin elderly male, orally intubated and still flaccid after paralysis from intubation. Neuro:  Still flaccid. HEENT: Lens replacement in right eye Cardiovascular: Reg rhythm. 2/6 blowing systolic murmur best heard at apex and radiating into left neck  Lungs: Symmetric air movement, scattered rhonchi, no wheezes Abdomen:  Flat soft, quiet, no masses. No icterus. Suprapubic in place Skin:  Dressing on left anterior leg  LABS:  BMET No results for input(s): NA, K, CL, CO2, BUN, CREATININE, GLUCOSE in the last 168 hours.  Electrolytes No results for input(s): CALCIUM, MG, PHOS in the last 168 hours.  CBC  Recent Labs Lab 07/17/2017 0723   WBC 20.1*  HGB 14.7  HCT 45.4  PLT 205    Coag's No results for input(s): APTT, INR in the last 168 hours.  Sepsis Markers  Recent Labs Lab 06/28/2017 0739  LATICACIDVEN 4.29*    ABG  Recent Labs Lab 07/07/2017 0839  PHART 7.140*  PCO2ART 57.0*  PO2ART 245.0*    Liver Enzymes No results for input(s): AST, ALT, ALKPHOS, BILITOT, ALBUMIN in the last 168 hours.  Cardiac Enzymes No results for input(s): TROPONINI, PROBNP in the last 168 hours.  Glucose No results for input(s): GLUCAP in the last 168 hours.  Imaging Dg Chest Portable 1 View  Result Date: 07/14/2017 CLINICAL DATA:  Endotracheal tube placement. EXAM: PORTABLE CHEST 1 VIEW COMPARISON:  Radiographs of April 29, 2016. FINDINGS: Stable cardiomediastinal silhouette. Status post coronary bypass graft. Endotracheal tube is seen projected over tracheal air shadow with distal tip 5 cm above the carina. Distal tip of nasogastric tube is seen in proximal stomach. No pneumothorax is noted. Calcified pleural plaques are seen bilaterally consistent with asbestos exposure. Mild loculated left pleural effusion is noted. Bony thorax is unremarkable. IMPRESSION: Endotracheal and nasogastric tubes in grossly good position. Mild loculated left pleural effusion is noted. Stable calcified pleural plaques are noted bilaterally consistent with asbestos exposure. Electronically Signed   By: Marijo Conception, M.D.   On: 07/10/2017 07:41     STUDIES:  CXR to my eye shows well placed ETT, hazy left heart boarder suggesting lingular infiltrate and pleural plaques  CULTURES: Blood and urine sent 9/8  ANTIBIOTICS: Vanco and Zosyn  SIGNIFICANT EVENTS:   LINES/TUBES:  ASSESSMENT / PLAN:  Sepsis. Family does not desire extremely aggressive care. Covering with Vanco and Zosyn for both urinary and respiratory sources. Providing liberal fluids. If he does not respond to these simple measures, family does not want me to add pressors or other  aggressive interventions  Respiratory failure. He has hypoxic respiratory failure, a history of respiratory symptoms, and an abnormal CXR. I don't think I need to additionally invoke a possible PE. Treating with antibiotics, if he does not respond in the next 48 hours, family would entertain terminal extubation.  CAD. There are slight ST changes on the EKG. Treating with aspirin and statin alone for now, family not interested in aggressive interventions.  DM. Not clear that he is really glucose intolerant. SSI ordered, A1C pending  DNR. He is to remain a DNR. If he declines despite antibiotics and fluids, we are not to provide additional aggressive interventions.    Greater than 35 minutes was spent in the care of this patient with life threatening hypoxic respiratory failure today.  Lars Masson, MD Pulmonary and [place] Pager: [phone]  07/11/2017, 9:26 AM

## 2017-07-05 NOTE — ED Notes (Signed)
MD Kohut at bedside exchanging out patient suprapubic foley

## 2017-07-05 NOTE — Progress Notes (Signed)
PHARMACY - PHYSICIAN COMMUNICATION CRITICAL VALUE ALERT - BLOOD CULTURE IDENTIFICATION (BCID)  Results for orders placed or performed during the hospital encounter of 06/28/2017  Blood Culture ID Panel (Reflexed) (Collected: 07/02/2017  7:23 AM)  Result Value Ref Range   Enterococcus species NOT DETECTED NOT DETECTED   Vancomycin resistance NOT DETECTED NOT DETECTED   Listeria monocytogenes NOT DETECTED NOT DETECTED   Staphylococcus species NOT DETECTED NOT DETECTED   Staphylococcus aureus NOT DETECTED NOT DETECTED   Methicillin resistance NOT DETECTED NOT DETECTED   Streptococcus species DETECTED (A) NOT DETECTED   Streptococcus agalactiae NOT DETECTED NOT DETECTED   Streptococcus pneumoniae DETECTED (A) NOT DETECTED   Streptococcus pyogenes NOT DETECTED NOT DETECTED   Acinetobacter baumannii NOT DETECTED NOT DETECTED   Enterobacteriaceae species NOT DETECTED NOT DETECTED   Enterobacter cloacae complex NOT DETECTED NOT DETECTED   Escherichia coli NOT DETECTED NOT DETECTED   Klebsiella oxytoca NOT DETECTED NOT DETECTED   Klebsiella pneumoniae NOT DETECTED NOT DETECTED   Proteus species NOT DETECTED NOT DETECTED   Serratia marcescens NOT DETECTED NOT DETECTED   Carbapenem resistance NOT DETECTED NOT DETECTED   Haemophilus influenzae NOT DETECTED NOT DETECTED   Neisseria meningitidis NOT DETECTED NOT DETECTED   Pseudomonas aeruginosa NOT DETECTED NOT DETECTED   Candida albicans NOT DETECTED NOT DETECTED   Candida glabrata NOT DETECTED NOT DETECTED   Candida krusei NOT DETECTED NOT DETECTED   Candida parapsilosis NOT DETECTED NOT DETECTED   Candida tropicalis NOT DETECTED NOT DETECTED   Name of physician (or Provider) Contacted: Dr. Oletta Darter  Changes to prescribed antibiotics required: Recommended narrowing to Rocephin 2g/24h however MD wanted to keep broad coverage with Vancomycin + Zosyn for now.   Alycia Rossetti, PharmD, BCPS Pager: [phone] 7:01 PM

## 2017-07-05 NOTE — Progress Notes (Signed)
Patient transported from ED to 2M05. VS stable. No apparent complications during transport.

## 2017-07-05 NOTE — ED Provider Notes (Signed)
[facility] DEPT Provider Note   CSN: [id number] Arrival date & time: 07/26/2017  5621     History   Chief Complaint Chief Complaint  Patient presents with  . Respiratory Distress    HPI Ryan Pacheco is a 83 y.o. male.  HPI   89yM brought in by EMS from NH. Found at change of shift poorly responsive and respiratory distress. Hypoxic for EMS in 67s. Not clear when last normal. Given nebs and solumderol for wheezing. o2 sats improving with NRB.   Past Medical History:  Diagnosis Date  . Acid reflux   . Angina   . Arthritis 01-09-12   hands, foot  . Benign prostatic hypertrophy   . Bradycardia   . Chronic kidney disease    Stage 1  through Stage 4 of unspecified chronic kidney disease.  . Diabetes mellitus   . Edema   . Embolism - blood clot ~ 2000   "behind left knee"  . Foley catheter in place    at present to leg bag  . Hearing impaired person, bilateral    right ear hearing aid-"loss other"  . History of asbestos exposure    "have some lung disease from years of exposure"  . Impaired gait and mobility    ambulates with walker- prone to frequent falls  . Myocardial infarction [facility]) 11/17/10    NSTEMI- Dr. Irish Lack follows  . Prostate hypertrophy 01-09-12   urinary retention-Foley catheter at present; surgery planned    Patient Active Problem List   Diagnosis Date Noted  . Aortic stenosis 04/03/2017  . Amputated toe of right foot ([place]) 02/27/2017  . Acute osteomyelitis of toe of right foot ([place]) 02/14/2017  . BPH (benign prostatic hyperplasia) 02/14/2017  . HTN (hypertension) 02/14/2017  . Type 2 diabetes with nephropathy ([place]) 02/14/2017  . CKD (chronic kidney disease), stage III 02/14/2017  . Acute renal failure ([place]) 05/19/2016  . Bladder outlet obstruction 05/19/2016  . Hyperkalemia 05/19/2016  . Frequent falls 05/19/2016  . Mixed hyperlipidemia 11/10/2013  . Old myocardial infarction 11/10/2013  . Edema 11/10/2013  . Postsurgical aortocoronary  bypass status 02/06/2012  . Coronary atherosclerosis 02/06/2012    Past Surgical History:  Procedure Laterality Date  . AMPUTATION Right 02/19/2017   Procedure: 2nd Ray Amputation Right Foot;  Surgeon: Newt Minion, MD;  Location: [place];  Service: Orthopedics;  Laterality: Right;  . BLEPHAROPLASTY     left eye  . CARDIAC CATHETERIZATION  01-09-12   1'13  . CATARACT EXTRACTION, BILATERAL  ~ 2010  . CHOLECYSTECTOMY  1980's  . CORONARY ARTERY BYPASS GRAFT  11/26/2011   Procedure:x4- CORONARY ARTERY BYPASS GRAFTING (CABG);  Surgeon: Gaye Pollack, MD;  Location: [place];  Service: Open Heart Surgery;  Laterality: N/A;  . INSERTION OF SUPRAPUBIC CATHETER N/A 08/05/2016   Procedure: CYSTOSCOPY AND INSERTION OF SUPRAPUBIC CATHETER;  Surgeon: Carolan Clines, MD;  Location: [facility];  Service: Urology;  Laterality: N/A;  . LEFT HEART CATHETERIZATION WITH CORONARY ANGIOGRAM N/A 11/20/2011   Procedure: LEFT HEART CATHETERIZATION WITH CORONARY ANGIOGRAM;  Surgeon: Jettie Booze, MD;  Location: [facility] CATH LAB;  Service: Cardiovascular;  Laterality: N/A;  possible PCI  . Suprapubic tube     3'13, then removed.       Home Medications    Prior to Admission medications   Medication Sig Start Date End Date Taking? Authorizing Provider  amLODipine (NORVASC) 10 MG tablet Take 10 mg by mouth daily. 03/12/17   [provider]  aspirin EC 81 MG tablet Take 1 tablet (81 mg total) by mouth daily. 04/03/17   Jettie Booze, MD  atorvastatin (LIPITOR) 10 MG tablet Take 1 tablet (10 mg total) by mouth daily. 04/03/17 07/02/17  Jettie Booze, MD  glimepiride (AMARYL) 1 MG tablet Take 1 mg by mouth daily. 03/12/17   [provider]  latanoprost (XALATAN) 0.005 % ophthalmic solution Place 1 drop into both eyes daily. 04/02/17   [provider]    Family History Family History  Problem Relation Age of Onset  . Heart attack Father   . Hypertension Neg Hx   . Stroke Neg Hx      Social History Social History  Substance Use Topics  . Smoking status: Former Smoker    Types: Cigars  . Smokeless tobacco: Former Systems developer    Types: Chew     Comment: "hadn't used chew or smoked cigars since 1990's  . Alcohol use No     Allergies   No known allergies   Review of Systems Review of Systems  Level 5 caveat because nonverbal/medical acuity.   Physical Exam Updated Vital Signs BP (!) 111/57   Pulse 92   Temp (!) 104 F (40 C) (Rectal)   Resp 18   Ht 5\' 7"  (1.702 m)   Wt 72.6 kg (160 lb)   SpO2 100%   BMI 25.06 kg/m   Physical Exam  Constitutional: He appears well-developed. He appears distressed.  HENT:  Head: Normocephalic and atraumatic.  Dry MM  Eyes: Conjunctivae are normal. Right eye exhibits no discharge. Left eye exhibits no discharge.  Neck: Neck supple.  Cardiovascular: Regular rhythm and normal heart sounds.  Exam reveals no gallop and no friction rub.   No murmur heard. tachy  Pulmonary/Chest: He is in respiratory distress.  Abdominal: Soft. He exhibits no distension. There is no tenderness.  Genitourinary:  Genitourinary Comments: Suprapubic catheter  Musculoskeletal: He exhibits no edema or tenderness.  Neurological:  Eyes open. Not following commands. Localizes pain. Withdrawals all extremities. nonverbal  Skin: Skin is warm. He is diaphoretic.  LE with mottled appearance but warm to touch  Psychiatric: He has a normal mood and affect. His behavior is normal. Thought content normal.  Nursing note and vitals reviewed.    ED Treatments / Results  Labs (all labs ordered are listed, but only abnormal results are displayed) Labs Reviewed  URINALYSIS, ROUTINE W REFLEX MICROSCOPIC - Abnormal; Notable for the following:       Result Value   Color, Urine AMBER (*)    APPearance CLOUDY (*)    Hgb urine dipstick SMALL (*)    Protein, ur 100 (*)    Leukocytes, UA SMALL (*)    Squamous Epithelial / LPF 0-5 (*)    All other  components within normal limits  CBC WITH DIFFERENTIAL/PLATELET - Abnormal; Notable for the following:    WBC 20.1 (*)    RDW 15.7 (*)    Neutro Abs 18.6 (*)    Lymphs Abs 0.5 (*)    All other components within normal limits  COMPREHENSIVE METABOLIC PANEL - Abnormal; Notable for the following:    Potassium 5.5 (*)    CO2 13 (*)    BUN 56 (*)    Creatinine, Ser 2.64 (*)    Calcium 8.0 (*)    Total Protein 5.8 (*)    Albumin 2.2 (*)    AST 175 (*)    ALT 105 (*)    Total Bilirubin  1.7 (*)    GFR calc non Af Amer 19 (*)    GFR calc Af Amer 22 (*)    All other components within normal limits  I-STAT CG4 LACTIC ACID, ED - Abnormal; Notable for the following:    Lactic Acid, Venous 4.29 (*)    All other components within normal limits  I-STAT ARTERIAL BLOOD GAS, ED - Abnormal; Notable for the following:    pH, Arterial 7.140 (*)    pCO2 arterial 57.0 (*)    pO2, Arterial 245.0 (*)    Bicarbonate 18.7 (*)    TCO2 20 (*)    Acid-base deficit 10.0 (*)    All other components within normal limits  I-STAT TROPONIN, ED - Abnormal; Notable for the following:    Troponin i, poc 0.66 (*)    All other components within normal limits  CULTURE, BLOOD (ROUTINE X 2)  CULTURE, BLOOD (ROUTINE X 2)  MAGNESIUM  CBG MONITORING, ED  I-STAT CG4 LACTIC ACID, ED  TYPE AND SCREEN    EKG  EKG Interpretation None       Radiology Dg Chest Portable 1 View  Result Date: 07/20/2017 CLINICAL DATA:  Endotracheal tube placement. EXAM: PORTABLE CHEST 1 VIEW COMPARISON:  Radiographs of April 29, 2016. FINDINGS: Stable cardiomediastinal silhouette. Status post coronary bypass graft. Endotracheal tube is seen projected over tracheal air shadow with distal tip 5 cm above the carina. Distal tip of nasogastric tube is seen in proximal stomach. No pneumothorax is noted. Calcified pleural plaques are seen bilaterally consistent with asbestos exposure. Mild loculated left pleural effusion is noted. Bony thorax is  unremarkable. IMPRESSION: Endotracheal and nasogastric tubes in grossly good position. Mild loculated left pleural effusion is noted. Stable calcified pleural plaques are noted bilaterally consistent with asbestos exposure. Electronically Signed   By: Marijo Conception, M.D.   On: 07/20/2017 07:41    Procedures Procedure Name: Intubation Date/Time: 07/15/2017 7:30 AM Performed by: Virgel Manifold Pre-anesthesia Checklist: Patient identified, Suction available, Patient being monitored and Emergency Drugs available Oxygen Delivery Method: Ambu bag Preoxygenation: Pre-oxygenation with 100% oxygen Induction Type: Rapid sequence Ventilation: Mask ventilation without difficulty Laryngoscope Size: Glidescope and 4 Grade View: Grade II Tube size: 8.0 mm Number of attempts: 1 Airway Equipment and Method: Rigid stylet Placement Confirmation: ETT inserted through vocal cords under direct vision,  Positive ETCO2 and Breath sounds checked- equal and bilateral Secured at: 24 cm Tube secured with: ETT holder     OG placement Date/Time: 07/20/2017 7:35 AM Performed by: Virgel Manifold Authorized by: Virgel Manifold  Consent: The procedure was performed in an emergent situation.  Sedation: Patient sedated: placed post-intubation after RSI meds.  Patient tolerance: Patient tolerated the procedure well with no immediate complications    CRITICAL CARE Performed by: Virgel Manifold Total critical care time: 45 minutes Critical care time was exclusive of separately billable procedures and treating other patients. Critical care was necessary to treat or prevent imminent or life-threatening deterioration. Critical care was time spent personally by me on the following activities: development of treatment plan with patient and/or surrogate as well as nursing, discussions with consultants, evaluation of patient's response to treatment, examination of patient, obtaining history from patient or surrogate, ordering  and performing treatments and interventions, ordering and review of laboratory studies, ordering and review of radiographic studies, pulse oximetry and re-evaluation of patient's condition.    (including critical care time)    Medications Ordered in ED Medications  etomidate (AMIDATE) injection (20 mg Intravenous Given  07/12/2017 0719)  rocuronium [facility]) injection (80 mg Intravenous Given 07/12/2017 0720)  vancomycin (VANCOCIN) 1,500 mg in sodium chloride 0.9 % 500 mL IVPB (not administered)  piperacillin-tazobactam (ZOSYN) IVPB 3.375 g (not administered)  propofol (DIPRIVAN) 1000 MG/100ML infusion (not administered)  acetaminophen (TYLENOL) suppository 650 mg (not administered)  sodium chloride 0.9 % bolus 2,000 mL (2,000 mLs Intravenous New Bag/Given 07/15/2017 0743)     Initial Impression / Assessment and Plan / ED Course  I have reviewed the triage vital signs and the nursing notes.  Pertinent labs & imaging results that were available during my care of the patient were reviewed by me and considered in my medical decision making (see chart for details).     89yM with sepsis. Intubated for respiratory failure. No paperwork accompanying patient and EMS not told otherwise, but actually DNR/DNI after being able to review records and speak with family. Discussed at length with daughter. On Tuesday it will be 7 years since his wife died. For several years he has said he is ready to go.   She does not want further escalation of care. No CPR. Discussed extubation, but she does not want this at this time.  "If you put him on a machine then maybe it was meant to be," but does not desire prolonged intubation if he is not progressing significantly. Discussed that with his advanced age and current critical illness that he probably won't but she wants to give him a chance.  Empiric abx. CXR with chronic appearing changes. Has suprapubic catheter. Urine probably most likely source. Catheter changed. BP ok,  but lactic acid >4. IVF.    Final Clinical Impressions(s) / ED Diagnoses   Final diagnoses:  Respiratory failure [facility])  UTI    New Prescriptions New Prescriptions   No medications on file     Virgel Manifold, MD 07/07/17 1159

## 2017-07-05 NOTE — Progress Notes (Signed)
CRITICAL VALUE ALERT  Critical Value:  LA= 2.2  Date & Time Notied:  07/23/2017 1927  Provider Notified: Dr Oletta Darter  Orders Received/Actions taken: yes

## 2017-07-05 NOTE — ED Triage Notes (Signed)
Pt to ER from [facility] after being found unresponsive this morning. Unknown time of altered level of consciousness. Initial O2 reading from fire reported to be 65% on RA, pt only responsive to pain at this time. EMS reports bilateral crackles, has given 2 duonebs and 125 mg solumedrol. 100% on NRB on arrival, MD Kohut at bedside. Preparing to intubate.

## 2017-07-06 DIAGNOSIS — A403 Sepsis due to Streptococcus pneumoniae: Secondary | ICD-10-CM

## 2017-07-06 LAB — COMPREHENSIVE METABOLIC PANEL
ALBUMIN: 1.9 g/dL — AB (ref 3.5–5.0)
ALK PHOS: 72 U/L (ref 38–126)
ALT: 101 U/L — AB (ref 17–63)
ANION GAP: 12 (ref 5–15)
AST: 128 U/L — ABNORMAL HIGH (ref 15–41)
BILIRUBIN TOTAL: 1.3 mg/dL — AB (ref 0.3–1.2)
BUN: 52 mg/dL — ABNORMAL HIGH (ref 6–20)
CALCIUM: 7.8 mg/dL — AB (ref 8.9–10.3)
CO2: 17 mmol/L — ABNORMAL LOW (ref 22–32)
CREATININE: 2.11 mg/dL — AB (ref 0.61–1.24)
Chloride: 111 mmol/L (ref 101–111)
GFR calc Af Amer: 29 mL/min — ABNORMAL LOW (ref >60)
GFR calc non Af Amer: 25 mL/min — ABNORMAL LOW (ref >60)
GLUCOSE: 112 mg/dL — AB (ref 65–99)
Potassium: 4.6 mmol/L (ref 3.5–5.1)
Sodium: 140 mmol/L (ref 135–145)
TOTAL PROTEIN: 5.5 g/dL — AB (ref 6.5–8.1)

## 2017-07-06 LAB — CBC WITH DIFFERENTIAL/PLATELET
BASOS ABS: 0 10*3/uL (ref 0.0–0.1)
BASOS PCT: 0 %
Eosinophils Absolute: 0 10*3/uL (ref 0.0–0.7)
Eosinophils Relative: 0 %
HEMATOCRIT: 34.7 % — AB (ref 39.0–52.0)
HEMOGLOBIN: 10.9 g/dL — AB (ref 13.0–17.0)
LYMPHS PCT: 3 %
Lymphs Abs: 0.6 10*3/uL — ABNORMAL LOW (ref 0.7–4.0)
MCH: 29.2 pg (ref 26.0–34.0)
MCHC: 31.4 g/dL (ref 30.0–36.0)
MCV: 93 fL (ref 78.0–100.0)
MONOS PCT: 4 %
Monocytes Absolute: 0.8 10*3/uL (ref 0.1–1.0)
NEUTROS ABS: 19.1 10*3/uL — AB (ref 1.7–7.7)
Neutrophils Relative %: 93 %
Platelets: 198 10*3/uL (ref 150–400)
RBC: 3.73 MIL/uL — ABNORMAL LOW (ref 4.22–5.81)
RDW: 16 % — ABNORMAL HIGH (ref 11.5–15.5)
WBC: 20.5 10*3/uL — ABNORMAL HIGH (ref 4.0–10.5)

## 2017-07-06 LAB — BLOOD GAS, ARTERIAL
ACID-BASE DEFICIT: 5.3 mmol/L — AB (ref 0.0–2.0)
Bicarbonate: 19.8 mmol/L — ABNORMAL LOW (ref 20.0–28.0)
Drawn by: 41977
FIO2: 40
O2 Saturation: 93 %
PEEP: 5 cmH2O
PO2 ART: 73 mmHg — AB (ref 83.0–108.0)
Patient temperature: 98.6
RATE: 20 resp/min
VT: 400 mL
pCO2 arterial: 40.3 mmHg (ref 32.0–48.0)
pH, Arterial: 7.313 — ABNORMAL LOW (ref 7.350–7.450)

## 2017-07-06 LAB — GLUCOSE, CAPILLARY
Glucose-Capillary: 133 mg/dL — ABNORMAL HIGH (ref 65–99)
Glucose-Capillary: 98 mg/dL (ref 65–99)
Glucose-Capillary: 106 mg/dL — ABNORMAL HIGH (ref 65–99)
Glucose-Capillary: 119 mg/dL — ABNORMAL HIGH (ref 65–99)
Glucose-Capillary: 141 mg/dL — ABNORMAL HIGH (ref 65–99)

## 2017-07-06 LAB — HEMOGLOBIN A1C
Hgb A1c MFr Bld: 5.4 % (ref 4.8–5.6)
Mean Plasma Glucose: 108.28 mg/dL

## 2017-07-06 LAB — PHOSPHORUS: Phosphorus: 5.3 mg/dL — ABNORMAL HIGH (ref 2.5–4.6)

## 2017-07-06 LAB — MAGNESIUM: Magnesium: 2.5 mg/dL — ABNORMAL HIGH (ref 1.7–2.4)

## 2017-07-06 MED ORDER — PRO-STAT SUGAR FREE PO LIQD
30.0000 mL | Freq: Two times a day (BID) | ORAL | Status: DC
  Administered 2017-07-06 – 2017-07-07 (×3): 30 mL
  Filled 2017-07-06 (×4): qty 30

## 2017-07-06 MED ORDER — ASPIRIN 81 MG PO CHEW
81.0000 mg | CHEWABLE_TABLET | Freq: Every day | ORAL | Status: DC
  Administered 2017-07-06 – 2017-07-07 (×2): 81 mg
  Filled 2017-07-06 (×2): qty 1

## 2017-07-06 MED ORDER — ATORVASTATIN CALCIUM 40 MG PO TABS
40.0000 mg | ORAL_TABLET | Freq: Every day | ORAL | Status: DC
  Administered 2017-07-06 – 2017-07-07 (×2): 40 mg
  Filled 2017-07-06 (×2): qty 1

## 2017-07-06 MED ORDER — PANTOPRAZOLE SODIUM 40 MG PO PACK
40.0000 mg | PACK | ORAL | Status: DC
  Administered 2017-07-06 – 2017-07-07 (×2): 40 mg
  Filled 2017-07-06 (×3): qty 20

## 2017-07-06 MED ORDER — CEFTRIAXONE SODIUM 2 G IJ SOLR
2.0000 g | INTRAMUSCULAR | Status: DC
  Administered 2017-07-06 – 2017-07-07 (×2): 2 g via INTRAVENOUS
  Filled 2017-07-06 (×3): qty 2

## 2017-07-06 MED ORDER — VITAL HIGH PROTEIN PO LIQD
1000.0000 mL | ORAL | Status: DC
  Administered 2017-07-06 (×4)
  Administered 2017-07-06: 1000 mL
  Administered 2017-07-06 – 2017-07-07 (×2)
  Filled 2017-07-06 (×2): qty 1000

## 2017-07-06 NOTE — Progress Notes (Signed)
PULMONARY / CRITICAL CARE MEDICINE   Name: HALO LASKI MRN: [id number] DOB: [birth year]    ADMISSION DATE:  07/01/2017  REFERRING PROVIDER: Dr. Wilson Singer, ER  CHIEF COMPLAINT:  Fever  HISTORY OF PRESENT ILLNESS:   83 yo male from assisted living brought to ER with altered mental status, fever 104F, hypoxia.  Intubated in ER, then determined he was actually DNR.  Family opted to continue medical therapies, but no cardiac resuscitation.  SUBJECTIVE:  Tolerating some pressure support.  VITAL SIGNS: BP (!) 145/56   Pulse (!) 50   Temp 97.7 F (36.5 C) (Oral)   Resp 15   Ht 5\' 7"  (1.702 m)   Wt 179 lb 7.3 oz (81.4 kg)   SpO2 96%   BMI 28.11 kg/m   VENTILATOR SETTINGS: Vent Mode: PRVC FiO2 (%):  [40 %-50 %] 40 % Set Rate:  [16 bmp-20 bmp] 20 bmp Vt Set:  [400 mL-530 mL] 400 mL PEEP:  [5 cmH20] 5 cmH20 Plateau Pressure:  [16 cmH20-24 cmH20] 20 cmH20  INTAKE / OUTPUT: I/O last 3 completed shifts: In: 5 [I.V.:5895; NG/GT:30; IV Piggyback:600] Out: 2751 [Urine:1025; Emesis/NG output:50]  PHYSICAL EXAMINATION:  General - sedated Eyes - rt pupil non reactive ENT - ETT in place Cardiac - regular, no murmur Chest - b/l crackles Abd - soft, non tender Ext - 1+ edema Skin - no rashes Neuro - moves extremities   LABS:  BMET  Recent Labs Lab 07/04/2017 0923 07/06/17 0301  NA 138 140  K 5.5* 4.6  CL 110 111  CO2 13* 17*  BUN 56* 52*  CREATININE 2.64* 2.11*  GLUCOSE 88 112*    Electrolytes  Recent Labs Lab 06/30/2017 0923 07/18/2017 1640 07/06/17 0301  CALCIUM 8.0*  --  7.8*  MG 2.2  --   --   PHOS  --  5.5*  --     CBC  Recent Labs Lab 07/26/2017 0723 07/06/17 0301  WBC 20.1* 20.5*  HGB 14.7 10.9*  HCT 45.4 34.7*  PLT 205 198    Coag's No results for input(s): APTT, INR in the last 168 hours.  Sepsis Markers  Recent Labs Lab 07/13/2017 1054 07/09/2017 1640 07/07/2017 1841  LATICACIDVEN 4.28* 2.4* 2.2*  PROCALCITON  --  15.40  --      ABG  Recent Labs Lab 07/12/2017 1242 07/17/2017 1634 07/06/17 0400  PHART 7.332* 7.250* 7.313*  PCO2ART 28.1* 42.2 40.3  PO2ART 134.0* 99.0 73.0*    Liver Enzymes  Recent Labs Lab 07/20/2017 0923 07/06/17 0301  AST 175* 128*  ALT 105* 101*  ALKPHOS 81 72  BILITOT 1.7* 1.3*  ALBUMIN 2.2* 1.9*    Cardiac Enzymes No results for input(s): TROPONINI, PROBNP in the last 168 hours.  Glucose  Recent Labs Lab 07/16/2017 0937 07/21/2017 1132 07/24/2017 1945 06/30/2017 2344 07/06/17 0349  GLUCAP 72 95 131* 133* 98    Imaging No results found.   STUDIES:   CULTURES: Blood >> Pneumococcus  ANTIBIOTICS: Vancomycin 9/08 >> 9/09 Zosyn 9/08 >> 9/09 Rocephin 9/09 >>  SIGNIFICANT EVENTS: 9/08 Admit  LINES/TUBES: ETT 9/08 >>  ASSESSMENT / PLAN:  Sepsis from pneumococcal pneumonia with bacteremia. - change abx to rocephin - f/u procalcitionin  Acute hypoxic respiratory failure. Pleural plaques from asbestos exposure. - continue vent support - f/u CXR  Hx of CAD, HTN, HLD. - ASA, lipitor  Acute renal failure from ATN >> baseline creatinine 0.96 from 02/21/16. Metabolic acidosis with lactic acidosis. - f/u BMET  Anemia of  critical illness. - f/u CBC  Diabetes. - SSI  DVT prophylaxis - lovenox SUP - protonix Nutrition - tube feeds Goals of care - DNR  CC time 21 minutes  Chesley Mires, MD [place] 07/06/2017, 9:40 AM Pager:  [phone] After 3pm call: [phone]

## 2017-07-06 NOTE — Progress Notes (Signed)
Pharmacy Antibiotic Note  Ryan Pacheco is a 83 y.o. male admitted on 07/13/2017 with fever and hypoxia after being found down at nursing home. Narrowing antibiotics to ceftriaxone for strep pneumo bacteremia. Tmax is 104 and WBC is elevated at 20.5. SCr is elevated but down and lactic acid remains elevated.   Plan: Ceftriaxone 2gm IV Q24H F/u C&S, clinical status and LOT   Height: 5\' 7"  (170.2 cm) Weight: 179 lb 7.3 oz (81.4 kg) IBW/kg (Calculated) : 66.1  Temp (24hrs), Avg:98.6 F (37 C), Min:97.7 F (36.5 C), Max:100.4 F (38 C)   Recent Labs Lab 07/04/2017 0723 06/29/2017 0739 07/12/2017 0923 07/12/2017 1054 07/02/2017 1640 07/04/2017 1841 07/06/17 0301  WBC 20.1*  --   --   --   --   --  20.5*  CREATININE  --   --  2.64*  --   --   --  2.11*  LATICACIDVEN  --  4.29*  --  4.28* 2.4* 2.2*  --     Estimated Creatinine Clearance: 21.4 mL/min (A) (by C-G formula based on SCr of 2.11 mg/dL (H)).    No Known Allergies   Vanc 9/8 >>9/9 Zosyn 9/8 >>9/9 CTX 9/9>>  9/8 BCx - strep pneumo  Salome Arnt, PharmD, BCPS 07/06/2017 9:57 AM

## 2017-07-07 ENCOUNTER — Inpatient Hospital Stay (HOSPITAL_COMMUNITY): Payer: Medicare Other

## 2017-07-07 LAB — GLUCOSE, CAPILLARY
GLUCOSE-CAPILLARY: 135 mg/dL — AB (ref 65–99)
GLUCOSE-CAPILLARY: 175 mg/dL — AB (ref 65–99)
Glucose-Capillary: 119 mg/dL — ABNORMAL HIGH (ref 65–99)
Glucose-Capillary: 148 mg/dL — ABNORMAL HIGH (ref 65–99)
Glucose-Capillary: 152 mg/dL — ABNORMAL HIGH (ref 65–99)
Glucose-Capillary: 172 mg/dL — ABNORMAL HIGH (ref 65–99)

## 2017-07-07 LAB — CBC
HEMATOCRIT: 34.1 % — AB (ref 39.0–52.0)
Hemoglobin: 10.9 g/dL — ABNORMAL LOW (ref 13.0–17.0)
MCH: 29.3 pg (ref 26.0–34.0)
MCHC: 32 g/dL (ref 30.0–36.0)
MCV: 91.7 fL (ref 78.0–100.0)
Platelets: 209 10*3/uL (ref 150–400)
RBC: 3.72 MIL/uL — AB (ref 4.22–5.81)
RDW: 16.2 % — ABNORMAL HIGH (ref 11.5–15.5)
WBC: 22.2 10*3/uL — AB (ref 4.0–10.5)

## 2017-07-07 LAB — PHOSPHORUS
Phosphorus: 4.4 mg/dL (ref 2.5–4.6)
Phosphorus: 3.8 mg/dL (ref 2.5–4.6)

## 2017-07-07 LAB — BASIC METABOLIC PANEL
Anion gap: 7 (ref 5–15)
BUN: 68 mg/dL — AB (ref 6–20)
CHLORIDE: 114 mmol/L — AB (ref 101–111)
CO2: 22 mmol/L (ref 22–32)
CREATININE: 1.87 mg/dL — AB (ref 0.61–1.24)
Calcium: 8.3 mg/dL — ABNORMAL LOW (ref 8.9–10.3)
GFR calc non Af Amer: 29 mL/min — ABNORMAL LOW (ref >60)
GFR, EST AFRICAN AMERICAN: 34 mL/min — AB (ref >60)
Glucose, Bld: 153 mg/dL — ABNORMAL HIGH (ref 65–99)
POTASSIUM: 4.6 mmol/L (ref 3.5–5.1)
SODIUM: 143 mmol/L (ref 135–145)

## 2017-07-07 LAB — CULTURE, BLOOD (ROUTINE X 2)
Special Requests: ADEQUATE
Special Requests: ADEQUATE

## 2017-07-07 LAB — MAGNESIUM
Magnesium: 2.6 mg/dL — ABNORMAL HIGH (ref 1.7–2.4)
Magnesium: 2.7 mg/dL — ABNORMAL HIGH (ref 1.7–2.4)

## 2017-07-07 MED ORDER — VITAL AF 1.2 CAL PO LIQD
1000.0000 mL | ORAL | Status: DC
  Administered 2017-07-07: 1000 mL

## 2017-07-07 MED ORDER — FUROSEMIDE 10 MG/ML IJ SOLN
40.0000 mg | Freq: Once | INTRAMUSCULAR | Status: AC
  Administered 2017-07-07: 40 mg via INTRAVENOUS
  Filled 2017-07-07: qty 4

## 2017-07-07 MED ORDER — ACETAMINOPHEN 160 MG/5ML PO SOLN
650.0000 mg | Freq: Four times a day (QID) | ORAL | Status: DC | PRN
  Administered 2017-07-08: 650 mg
  Filled 2017-07-07: qty 20.3

## 2017-07-07 MED ORDER — FREE WATER
200.0000 mL | Freq: Three times a day (TID) | Status: DC
  Administered 2017-07-07 – 2017-07-08 (×3): 200 mL

## 2017-07-07 MED ORDER — FENTANYL CITRATE (PF) 100 MCG/2ML IJ SOLN
50.0000 ug | INTRAMUSCULAR | Status: DC | PRN
  Administered 2017-07-08: 50 ug via INTRAVENOUS
  Filled 2017-07-07: qty 2

## 2017-07-07 MED ORDER — FENTANYL CITRATE (PF) 100 MCG/2ML IJ SOLN: 50.0000 ug | INTRAMUSCULAR | Status: DC | PRN

## 2017-07-07 NOTE — Progress Notes (Signed)
Initial Nutrition Assessment  INTERVENTION:   D/C Vital High Protein and Prostat  Vital AF 1.2 @ 60 ml/hr (1440 ml/day) Provides: 1728 kcal, 108 grams protein, and 1167 ml free water.   NUTRITION DIAGNOSIS:   Inadequate oral intake related to inability to eat as evidenced by NPO status.  GOAL:   Patient will meet greater than or equal to 90% of their needs  MONITOR:   TF tolerance, Skin, Vent status  REASON FOR ASSESSMENT:   Consult, Ventilator Enteral/tube feeding initiation and management  ASSESSMENT:   Pt from ALF with fever/AMS admitted for sepsis from pneumococcal pneumonia with bacteremia.    No family present.   Patient is currently intubated on ventilator support MV: 11 L/min Temp (24hrs), Avg:99.3 F (37.4 C), Min:98.7 F (37.1 C), Max:100.5 F (38.1 C)  Medications reviewed  Labs reviewed: BUN/Cr 68/1.87, magnesium 2.6 (H) Nutrition-Focused physical exam completed. Findings are no fat depletion, mild/moderate temple muscle depletion, and mild LE edema.   Free water: 200 ml every 8 hours  Diet Order:  Diet NPO time specified  Skin:   (stage I buttocks, blister L leg)  Last BM:  9/8  Height:   Ht Readings from Last 1 Encounters:  07/18/2017 5\' 7"  (1.702 m)    Weight:   Wt Readings from Last 1 Encounters:  07/07/17 181 lb 10.5 oz (82.4 kg)  Admission weight: 175 lb (79.8 kg)  Ideal Body Weight:  67.2 kg  BMI:  Body mass index is 28.45 kg/m.  Estimated Nutritional Needs:   Kcal:  1750  Protein:  95-115 grams  Fluid:  > 1.8 L/day  EDUCATION NEEDS:   No education needs identified at this time  [place], [place], [place] Pager [phone] After Hours Pager

## 2017-07-07 NOTE — Progress Notes (Signed)
Wasted approximately 62mls of Fentanyl gtt with Vic Blackbird RN.

## 2017-07-07 NOTE — Progress Notes (Signed)
PULMONARY / CRITICAL CARE MEDICINE   Name: Ryan Pacheco MRN: [id number] DOB: [birth year]    ADMISSION DATE:  07/25/2017  REFERRING PROVIDER: Dr. Wilson Singer, ER  CHIEF COMPLAINT:  Fever  HISTORY OF PRESENT ILLNESS:   83 yo male from assisted living brought to ER with altered mental status, fever 104F, hypoxia.  Intubated in ER, then determined he was actually DNR.  Family opted to continue medical therapies, but no cardiac resuscitation.  SUBJECTIVE:  Not responding from mental status stand-point Weaning on higher levels of PSV  VITAL SIGNS: BP (!) 152/52   Pulse (!) 49   Temp 98.7 F (37.1 C) (Oral)   Resp 17   Ht 5\' 7"  (1.702 m)   Wt 181 lb 10.5 oz (82.4 kg)   SpO2 97%   BMI 28.45 kg/m   VENTILATOR SETTINGS: Vent Mode: CPAP;PSV FiO2 (%):  [40 %] 40 % Set Rate:  [20 bmp] 20 bmp Vt Set:  [400 mL] 400 mL PEEP:  [5 cmH20] 5 cmH20 Pressure Support:  [16 cmH20] 16 cmH20 Plateau Pressure:  [19 cmH20-21 cmH20] 21 cmH20  INTAKE / OUTPUT:  Intake/Output Summary (Last 24 hours) at 07/07/17 1049 Last data filed at 07/07/17 0800  Gross per 24 hour  Intake          2138.67 ml  Output             1175 ml  Net           963.67 ml     PHYSICAL EXAMINATION:  General appearance:  Chronically ill appearing 83 Year old  Male. Sedated on vent  Eyes: anicteric sclerae, moist conjunctivae; PERRL, EOMI bilaterally. Mouth:  membranes and no mucosal ulcerations; normal hard and soft palate, o Neck: Trachea midline; neck supple, no JVD Lungs/chest: scattered rhonchi, with normal respiratory effort and no intercostal retractions CV: RRR, no MRGs  Abdomen: Soft, non-tender; no masses or HSM Extremities: No peripheral edema or extremity lymphadenopathy Skin: Normal temperature, turgor and texture; no rash, ulcers or subcutaneous nodules Psych:minimally responsive. R pupil NR. Sedation stopped this am  LABS:  BMET  Recent Labs Lab 07/18/2017 0923 07/06/17 0301 07/07/17 0507  NA 138 140  143  K 5.5* 4.6 4.6  CL 110 111 114*  CO2 13* 17* 22  BUN 56* 52* 68*  CREATININE 2.64* 2.11* 1.87*  GLUCOSE 88 112* 153*    Electrolytes  Recent Labs Lab 06/29/2017 0923 07/07/2017 1640 07/06/17 0301 07/06/17 1744 07/07/17 0507  CALCIUM 8.0*  --  7.8*  --  8.3*  MG 2.2  --   --  2.5* 2.6*  PHOS  --  5.5*  --  5.3* 4.4    CBC  Recent Labs Lab 07/02/2017 0723 07/06/17 0301 07/07/17 0507  WBC 20.1* 20.5* 22.2*  HGB 14.7 10.9* 10.9*  HCT 45.4 34.7* 34.1*  PLT 205 198 209    Coag's No results for input(s): APTT, INR in the last 168 hours.  Sepsis Markers  Recent Labs Lab 07/07/2017 1054 07/13/2017 1640 07/01/2017 1841  LATICACIDVEN 4.28* 2.4* 2.2*  PROCALCITON  --  15.40  --     ABG  Recent Labs Lab 07/14/2017 1242 07/17/2017 1634 07/06/17 0400  PHART 7.332* 7.250* 7.313*  PCO2ART 28.1* 42.2 40.3  PO2ART 134.0* 99.0 73.0*    Liver Enzymes  Recent Labs Lab 07/17/2017 0923 07/06/17 0301  AST 175* 128*  ALT 105* 101*  ALKPHOS 81 72  BILITOT 1.7* 1.3*  ALBUMIN 2.2* 1.9*    Cardiac  Enzymes No results for input(s): TROPONINI, PROBNP in the last 168 hours.  Glucose  Recent Labs Lab 07/07/2017 2344 07/06/17 0349 07/06/17 1612 07/06/17 1938 07/06/17 2331 07/07/17 0334  GLUCAP 133* 98 106* 119* 141* 135*    Imaging Dg Chest Port 1 View  Result Date: 07/07/2017 CLINICAL DATA:  Respiratory failure EXAM: PORTABLE CHEST 1 VIEW COMPARISON:  06/29/2017 FINDINGS: Endotracheal and NG tubes are stable. Patchy bilateral airspace disease is stable. Mild cardiomegaly. Pleural calcifications over the hemidiaphragms can surrounding the lungs bilaterally with a relative sparing of the lung apices reflects calcified pleural plaques. No pneumothorax. IMPRESSION: Stable bilateral airspace disease and support apparatus. Electronically Signed   By: Marybelle Killings M.D.   On: 07/07/2017 07:13     STUDIES:   CULTURES: Blood >> Pneumococcus  ANTIBIOTICS: Vancomycin 9/08 >>  9/09 Zosyn 9/08 >> 9/09 Rocephin 9/09 >>  SIGNIFICANT EVENTS: 9/08 Admit  LINES/TUBES: ETT 9/08 >>  ASSESSMENT / PLAN:  Sepsis from pneumococcal pneumonia with bacteremia. -still w/ sig leukocytosis but afebrile -pressors-->off Plan abx day # 3; switched to rocephin 9/9 Repeat PCT   Acute hypoxic respiratory failure. Pleural plaques from asbestos exposure. -PCXR w/ patchy bilateral airspace disease. Also stable pleural plaques ETT good position. There may be element of new effusions.  -Mental status limiting factor to extubation although may be room for lasix  Plan Cont PSV as tolerated Lasix today  F/u am cxr Re-assess readiness to extubate.   Acute encephalopathy  -almost certainly metabolic and residual from sedating meds Plan Cont supportive care Dc sedating gtts RASS goal 0  Hx of CAD, HTN, HLD. Plan Cont asa and lipitor   Acute renal failure from ATN >> baseline creatinine 0.96 from 02/21/16; this is slowly improving  Metabolic acidosis with lactic acidosis. Mild Hyperchloremia  Plan Avoid hypotension  Free water  Cont I&O, f/u am chem  Anemia of critical illness. Plan F/u am cbc Cont LMWH  Diabetes. Plan ssi   DVT prophylaxis - lovenox SUP - protonix Nutrition - tube feeds Goals of care - DNR  Discussion Shock resolved.  Weaning Barrier to progress is mental status. I suspect that this is all acute encephalopathy that will clear slowly given his AKI but certainly hypoperfusion injury is on d-dx. The family is very clear on his advanced directives and do not want him on vent any longer than needed. They are also very concerned about QOL after his acute illness. We will cont supportive care up to 9/12. Likely looking at one-way extubation.   Erick Colace ACNP-BC [place] Pager # [phone] OR # [phone] if no answer

## 2017-07-08 ENCOUNTER — Inpatient Hospital Stay (HOSPITAL_COMMUNITY): Payer: Medicare Other

## 2017-07-08 LAB — CBC
HEMATOCRIT: 35.2 % — AB (ref 39.0–52.0)
Hemoglobin: 11.3 g/dL — ABNORMAL LOW (ref 13.0–17.0)
MCH: 29.1 pg (ref 26.0–34.0)
MCHC: 32.1 g/dL (ref 30.0–36.0)
MCV: 90.7 fL (ref 78.0–100.0)
Platelets: 231 10*3/uL (ref 150–400)
RBC: 3.88 MIL/uL — ABNORMAL LOW (ref 4.22–5.81)
RDW: 16.2 % — AB (ref 11.5–15.5)
WBC: 22 10*3/uL — ABNORMAL HIGH (ref 4.0–10.5)

## 2017-07-08 LAB — BASIC METABOLIC PANEL
Anion gap: 10 (ref 5–15)
BUN: 82 mg/dL — AB (ref 6–20)
CALCIUM: 8.3 mg/dL — AB (ref 8.9–10.3)
CO2: 24 mmol/L (ref 22–32)
Chloride: 113 mmol/L — ABNORMAL HIGH (ref 101–111)
Creatinine, Ser: 1.91 mg/dL — ABNORMAL HIGH (ref 0.61–1.24)
GFR calc Af Amer: 33 mL/min — ABNORMAL LOW (ref >60)
GFR calc non Af Amer: 28 mL/min — ABNORMAL LOW (ref >60)
GLUCOSE: 186 mg/dL — AB (ref 65–99)
Potassium: 3.7 mmol/L (ref 3.5–5.1)
Sodium: 147 mmol/L — ABNORMAL HIGH (ref 135–145)

## 2017-07-08 LAB — GLUCOSE, CAPILLARY
Glucose-Capillary: 171 mg/dL — ABNORMAL HIGH (ref 65–99)
Glucose-Capillary: 200 mg/dL — ABNORMAL HIGH (ref 65–99)

## 2017-07-08 MED ORDER — MORPHINE BOLUS VIA INFUSION
5.0000 mg | INTRAVENOUS | Status: DC | PRN
  Filled 2017-07-08: qty 20

## 2017-07-08 MED ORDER — SODIUM CHLORIDE 0.9 % IV SOLN
10.0000 mg/h | INTRAVENOUS | Status: DC
  Administered 2017-07-08: 10 mg/h via INTRAVENOUS
  Filled 2017-07-08: qty 10

## 2017-07-10 ENCOUNTER — Telehealth: Payer: Self-pay

## 2017-07-10 NOTE — Telephone Encounter (Signed)
On 07/10/2017 I received a death certificate from [place] (original). The death certificate is for emtombment. The patient is a patient of Doctor Maneem. The death certificate will be taken to [facility] ([address]) this pm for signature.  On 07-16-17 I received the death certificate back from Doctor Maneem. I got the death certificate ready and called the funeral home to let them know the death certificate is ready for pickup.

## 2017-07-14 NOTE — Discharge Summary (Signed)
Physician Discharge Summary  Patient ID: Ryan Pacheco MRN: [id number] DOB/AGE: [birth year] 83 y.o.  Admit date: 07/11/2017 Discharge date: 07/08/18  Admission Diagnoses: Sepsis  Discharge Diagnoses:  Sepsis Pneumococcal pneumonia Acute resp failure Acute renal failure  Discharged Condition: Deceased  Hospital Course:  Ryan Pacheco was admitted for Pneumococcal PNA, bacteremia and septic shock. He was intubated in ER but later determined that his code status was DNR. His course was complicated by: acute on chronic renal failure, lactic acidosis and on-going acute encephalopathy/COMA. He has been off sedation for over 24 hrs and still remained unresponsive. His renal function worsened after attempts at diuresis, he had more apparent pleural effusions which appear loculated at least on the left. Also on exam he was mottled and has new heart murmur. Medical team had a long discussion w/ his daughter Ryan Pacheco. She was still very clear that Ryan Pacheco would not want this level of care and given the clinical decline his chances of what would be a acceptable outcome for him are poor at best. Given the patients advanced directives and the opinion of his daughter we decided to honor Ryan. Ryan Pacheco advanced directives and transition to comfort.   Disposition: 20-Expired   Allergies as of 07/09/2017   No Known Allergies     Medication List    ASK your doctor about these medications   acetaminophen 325 MG tablet Commonly known as:  TYLENOL Take 650 mg by mouth every 6 (six) hours as needed for moderate pain.   aspirin EC 81 MG tablet Take 1 tablet (81 mg total) by mouth daily.   atorvastatin 10 MG tablet Commonly known as:  LIPITOR Take 1 tablet (10 mg total) by mouth daily.   buPROPion 300 MG 24 hr tablet Commonly known as:  WELLBUTRIN XL Take 300 mg by mouth daily.   DAILY-VITE PO Take 1 tablet by mouth daily.   furosemide 20 MG tablet Commonly known as:  LASIX Take 20 mg by mouth  daily.   guaiFENesin 600 MG 12 hr tablet Commonly known as:  MUCINEX Take 600 mg by mouth 2 (two) times daily as needed for cough.   nystatin cream Commonly known as:  MYCOSTATIN Apply 1 application topically 2 (two) times daily.   nystatin powder Generic drug:  nystatin Apply 1 g topically 2 (two) times daily. After application of cream   pantoprazole 40 MG tablet Commonly known as:  PROTONIX Take 40 mg by mouth daily.   potassium chloride SA 20 MEQ tablet Commonly known as:  K-DUR,KLOR-CON Take 20 mEq by mouth 2 (two) times daily.   REFRESH TEARS 0.5 % Soln Generic drug:  carboxymethylcellulose Place 1 drop into both eyes 3 (three) times daily as needed (fot dry eyes).   saccharomyces boulardii 250 MG capsule Commonly known as:  FLORASTOR Take 250 mg by mouth 2 (two) times daily.   traMADol 50 MG tablet Commonly known as:  ULTRAM Take 50 mg by mouth every 6 (six) hours as needed for moderate pain.        Signed: Tiawana Forgy 07/14/2017, 12:20 PM

## 2017-07-28 NOTE — Procedures (Signed)
Extubation Procedure Note  Patient Details:   Name: Ryan Pacheco DOB: [birth year] MRN: [id number]   Airway Documentation:  Airway 8 mm (Active)  Secured at (cm) 26 cm 2017-07-28  8:30 AM  Measured From Lips July 28, 2017  8:30 AM  Secured Location Center July 28, 2017  7:49 AM  Secured By [employer] 07/28/2017  8:30 AM  Tube Holder Repositioned Yes 28-Jul-2017  7:49 AM  Cuff Pressure (cm H2O) 26 cm H2O July 28, 2017  7:49 AM  Site Condition Dry 2017-07-28  7:49 AM    Evaluation  O2 sats: stable throughout Complications: No apparent complications Patient did tolerate procedure well. Bilateral Breath Sounds: Clear   No   Patient terminally extubated per withdrawal of life order/protocol.   Chloris Marcoux Clyda Greener 07/28/2017, 11:43 AM

## 2017-07-28 NOTE — Progress Notes (Signed)
PULMONARY / CRITICAL CARE MEDICINE   Name: Ryan Pacheco MRN: [id number] DOB: 1920/01/15    ADMISSION DATE:  07/07/2017  REFERRING PROVIDER: Dr. Wilson Singer, ER  CHIEF COMPLAINT:  Fever  HISTORY OF PRESENT ILLNESS:   83 yo male from assisted living brought to ER with altered mental status, fever 104F, hypoxia.  Intubated in ER, then determined he was actually DNR.  Family opted to continue medical therapies, but no cardiac resuscitation.  SUBJECTIVE:  Looks worse  Family ready to stop   VITAL SIGNS: BP (!) 181/67   Pulse (!) 51   Temp 98.5 F (36.9 C) (Axillary)   Resp (!) 24   Ht 5\' 7"  (1.702 m)   Wt 181 lb 10.5 oz (82.4 kg)   SpO2 99%   BMI 28.45 kg/m   VENTILATOR SETTINGS: Vent Mode: PSV;CPAP FiO2 (%):  [40 %] 40 % Set Rate:  [20 bmp] 20 bmp Vt Set:  [400 mL] 400 mL PEEP:  [5 cmH20] 5 cmH20 Pressure Support:  [10 cmH20-16 cmH20] 10 cmH20 Plateau Pressure:  [19 cmH20-21 cmH20] 21 cmH20  INTAKE / OUTPUT:  Intake/Output Summary (Last 24 hours) at 2017-07-18 1000 Last data filed at 2017-07-18 0900  Gross per 24 hour  Intake             2884 ml  Output             2550 ml  Net              334 ml     PHYSICAL EXAMINATION: General appearance:  83 Year old  Male, unresponsive on vent  Eyes: anicteric sclerae, right eye closed  Mouth:  Dry membranes, no  mucosal ulcerations;orally intubated Neck: Trachea midline; neck supple, no JVD Lungs/chest: decreased t/o  with normal respiratory effort and no intercostal retractions CV: RRR, new 4/7 holosystolic murmur. Mottled  Abdomen: Soft, non-tender; no masses  Skin: cool temperature, mottled Psych: GCS 4  LABS:  BMET  Recent Labs Lab 07/06/17 0301 07/07/17 0507 2017-07-18 0158  NA 140 143 147*  K 4.6 4.6 3.7  CL 111 114* 113*  CO2 17* 22 24  BUN 52* 68* 82*  CREATININE 2.11* 1.87* 1.91*  GLUCOSE 112* 153* 186*    Electrolytes  Recent Labs Lab 07/06/17 0301 07/06/17 1744 07/07/17 0507 07/07/17 1639  July 18, 2017 0158  CALCIUM 7.8*  --  8.3*  --  8.3*  MG  --  2.5* 2.6* 2.7*  --   PHOS  --  5.3* 4.4 3.8  --     CBC  Recent Labs Lab 07/06/17 0301 07/07/17 0507 07-18-2017 0158  WBC 20.5* 22.2* 22.0*  HGB 10.9* 10.9* 11.3*  HCT 34.7* 34.1* 35.2*  PLT 198 209 231    Coag's No results for input(s): APTT, INR in the last 168 hours.  Sepsis Markers  Recent Labs Lab 07/10/2017 1054 07/12/2017 1640 07/10/2017 1841  LATICACIDVEN 4.28* 2.4* 2.2*  PROCALCITON  --  15.40  --     ABG  Recent Labs Lab 07/24/2017 1242 07/13/2017 1634 07/06/17 0400  PHART 7.332* 7.250* 7.313*  PCO2ART 28.1* 42.2 40.3  PO2ART 134.0* 99.0 73.0*    Liver Enzymes  Recent Labs Lab 07/07/2017 0923 07/06/17 0301  AST 175* 128*  ALT 105* 101*  ALKPHOS 81 72  BILITOT 1.7* 1.3*  ALBUMIN 2.2* 1.9*    Cardiac Enzymes No results for input(s): TROPONINI, PROBNP in the last 168 hours.  Glucose  Recent Labs Lab 07/07/17 1203 07/07/17 1606 07/07/17  1954 07/07/17 2335 07/17/17 0311 2017/07/17 0736  GLUCAP 175* 148* 152* 172* 171* 200*    Imaging Dg Chest Port 1 View  Result Date: 17-Jul-2017 CLINICAL DATA:  Pneumonia. EXAM: PORTABLE CHEST 1 VIEW COMPARISON:  07/07/2017 FINDINGS: Endotracheal tube is unchanged and appropriately positioned between the clavicles and carina. Enteric tube courses into the left upper abdomen with tip not imaged. The cardiomediastinal silhouette is unchanged. 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 Calcified pleural plaques are again noted with blunting of both costophrenic angles which may reflect small effusions or pleural thickening. Patchy bilateral airspace opacities are unchanged and most notable in the left perihilar region. No pneumothorax is seen. IMPRESSION: Unchanged support devices and patchy airspace opacities. Electronically Signed   By: Logan Bores M.D.   On: 17-Jul-2017 07:17     STUDIES:   CULTURES: Blood >> Pneumococcus  ANTIBIOTICS: Vancomycin  9/08 >> 9/09 Zosyn 9/08 >> 9/09 Rocephin 9/09 >>9/11  SIGNIFICANT EVENTS: 9/08 Admit  LINES/TUBES: ETT 9/08 >>  ASSESSMENT / PLAN:  Sepsis from pneumococcal pneumonia with bacteremia. Possible endocarditis  Acute hypoxic respiratory failure. Pleural plaques from asbestos exposure. Acute encephalopathy/coma  Hx of CAD, HTN, HLD. Acute renal failure from ATN  Metabolic acidosis with lactic acidosis. Mild Hyperchloremia  Anemia of critical illness. Diabetes  Discussion Mr Ryan Pacheco was admitted for Pneumococcal PNA, bacteremia and septic shock. His course has been complicated by: acute on chronic renal failure, lactic acidosis and on-going acute encephalopathy/COMA. He is now 24 hours off sedation. He is still unresponsive. Today his renal function worsened after attempts at diuresis, he has more apparent pleural effusions which appear loculated at least on the left. Also on exam he is mottled and has new heart murmur that I did not appreciate on exam from 9/10. I had a long discussion w/ his daughter [name]. She is still very clear that Mr Ryan Pacheco would not want this level of care and given the clinical decline I feel more certain that his chances of what would be a acceptable outcome for him are poor at best. Given the patients advanced directives and the opinion of his daughter I think we should honor [name] advanced directives and transition to comfort.   Plan Dc all rx that does not contribute directly to comfort Morphine gtt Extubate Full comfort care.   My time at the bedside 35 minutes  Erick Colace ACNP-BC [place] Pager # [phone] OR # [phone] if no answer

## 2017-07-28 NOTE — Progress Notes (Signed)
Approximately 115mls of morphine gtt wasted in sink with Jabier Gauss RN.

## 2017-07-28 NOTE — Progress Notes (Signed)
Patient had just been extubated. Medical staff were still in the room. Family members, daughter and husband also on-site. Chaplain Mechell Girgis provided reflective listening, compassionate presence and emotional support.

## 2017-07-28 NOTE — Progress Notes (Signed)
Patient expired at 2216. Verified with Jabier Gauss, RN.

## 2017-07-28 DEATH — deceased

## 2018-09-03 IMAGING — CR DG FOOT COMPLETE 3+V*R*
3 series · 3 of 3 positions shown · non-contrast
Comparison: None.

CLINICAL DATA: Open wounds involving the first and second toes.

EXAM:
RIGHT FOOT COMPLETE - 3+ VIEW

[x foot ap right]
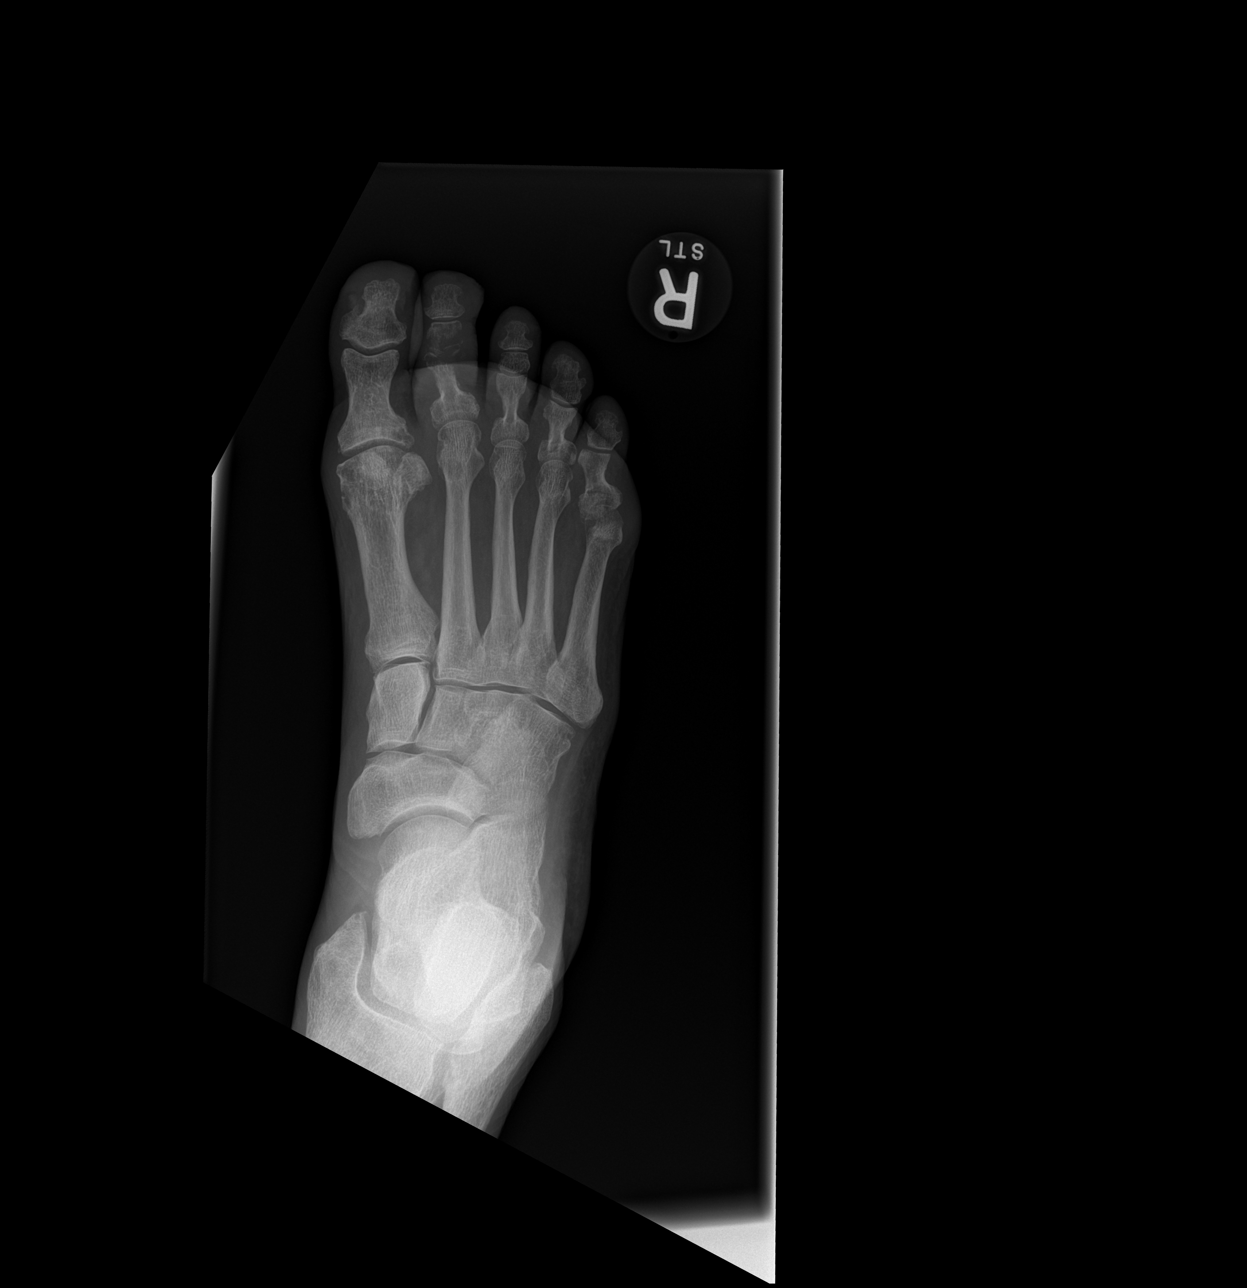

[x foot obl right]
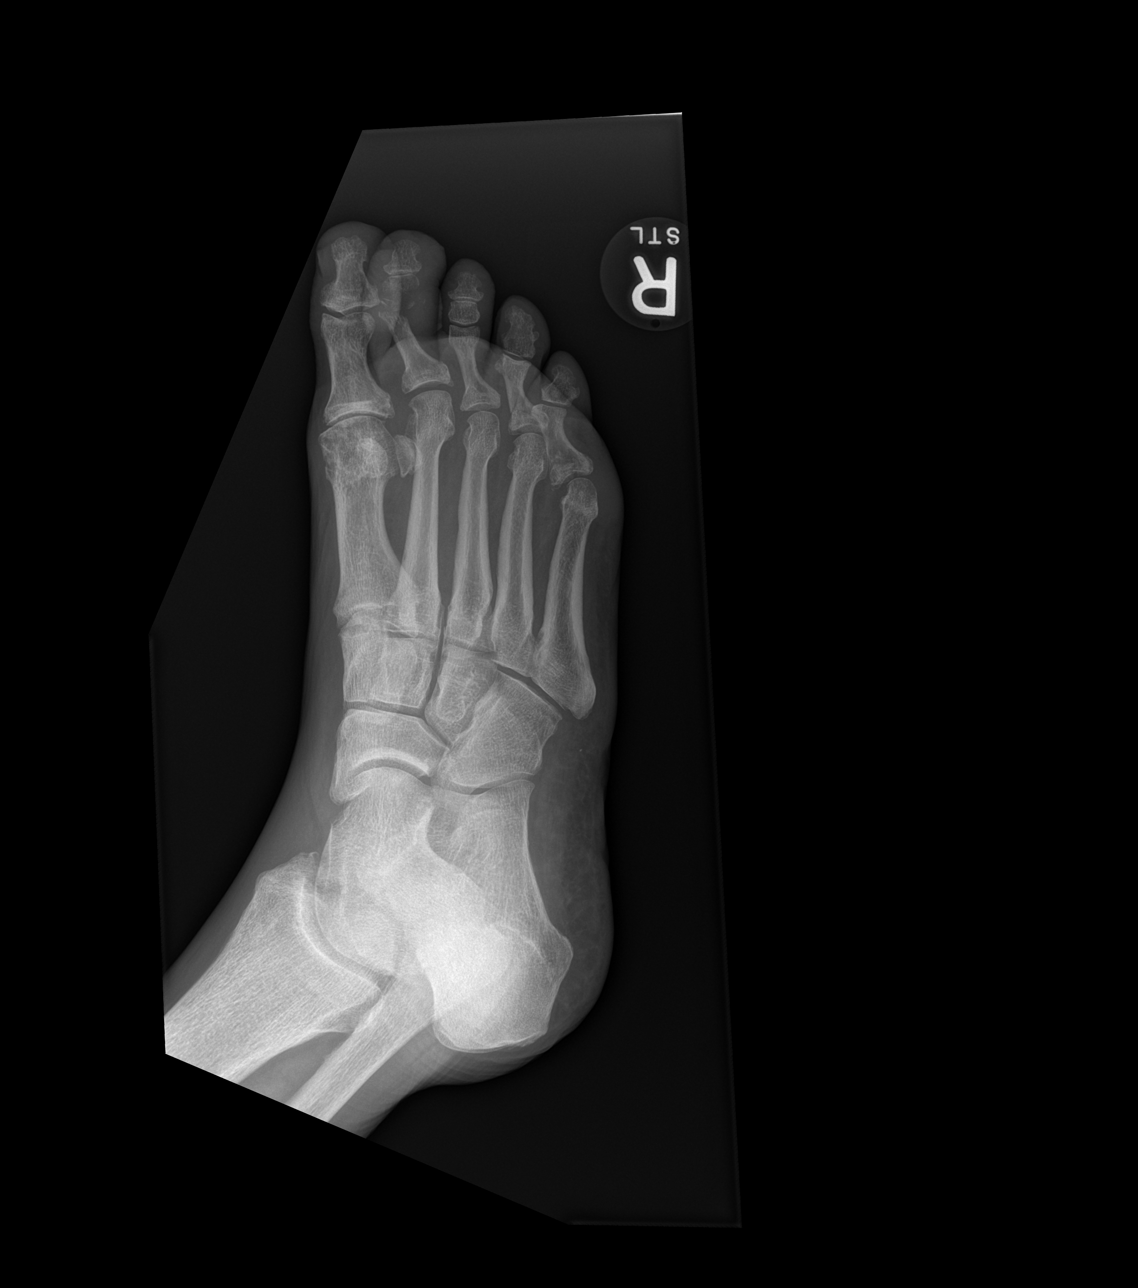

[x foot lat right]
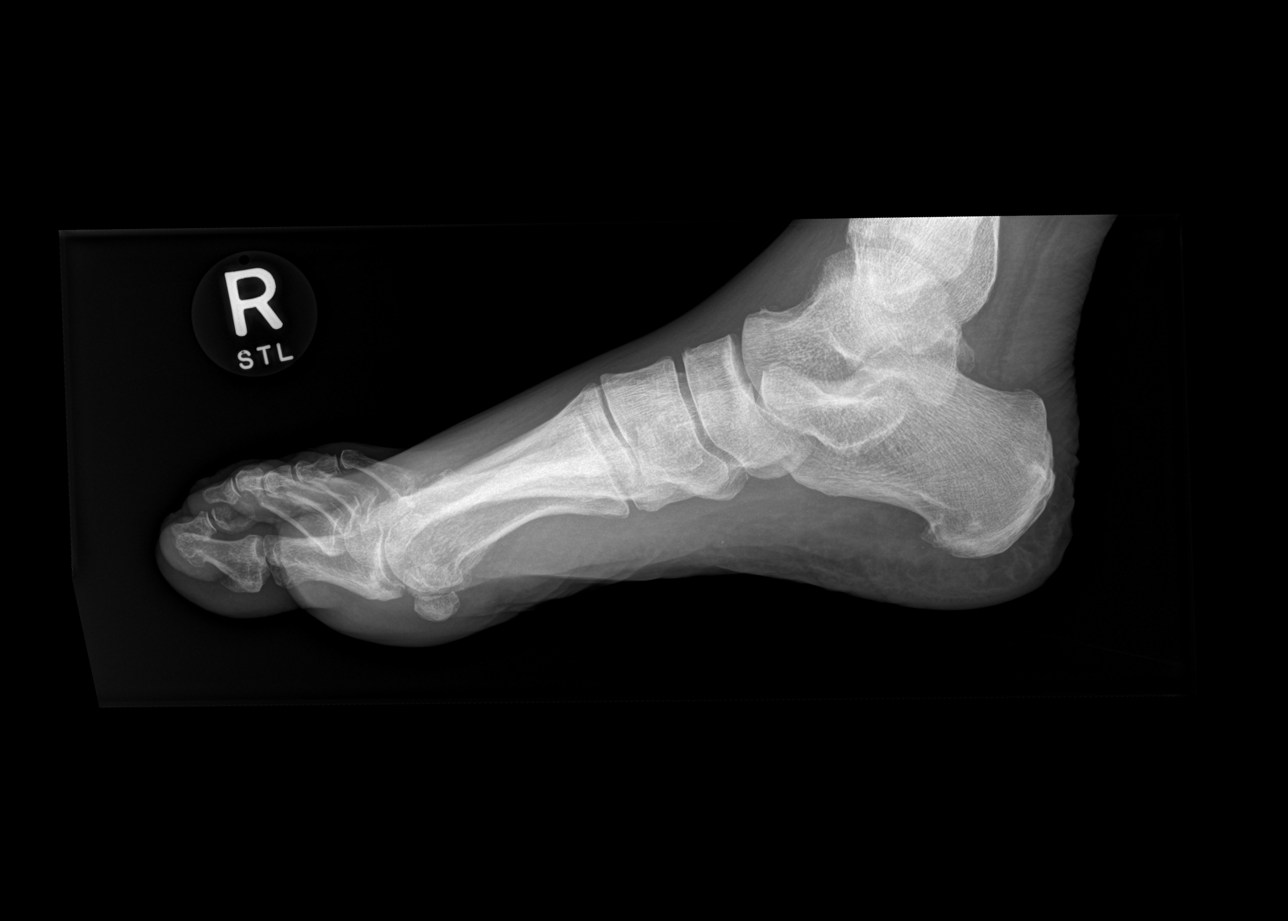

[3 of 3 positions shown; findings below may reference images not displayed]

FINDINGS: Destructive bony changes involving the second toe with findings
consistent with septic arthritis at the PIP joint and osteomyelitis
involving the adjacent proximal and middle phalanges.

Degenerative changes at the first metatarsal phalangeal joint. No
definite findings for osteomyelitis. Could not exclude fractures of
the fourth and fifth proximal phalanges. The metatarsals are intact.
IMPRESSION: Plain film findings consistent with septic arthritis involving the
PIP joint of the second toe with adjacent osteomyelitis involving
the proximal and middle phalanges.

Possible fractures of the fourth and fifth proximal phalanges.
